# Patient Record
Sex: Female | Born: 1960 | Race: Black or African American | Hispanic: No | Marital: Single | Smoking: Former smoker
Health system: Southern US, Community
[De-identification: ages and names within clinical notes are randomized; demographics above are authoritative.]

## PROBLEM LIST (undated history)

## (undated) DIAGNOSIS — Z9109 Other allergy status, other than to drugs and biological substances: Secondary | ICD-10-CM

## (undated) DIAGNOSIS — Z9289 Personal history of other medical treatment: Secondary | ICD-10-CM

## (undated) DIAGNOSIS — T7840XA Allergy, unspecified, initial encounter: Secondary | ICD-10-CM

## (undated) DIAGNOSIS — R51 Headache: Secondary | ICD-10-CM

## (undated) DIAGNOSIS — I10 Essential (primary) hypertension: Secondary | ICD-10-CM

## (undated) HISTORY — DX: Allergy, unspecified, initial encounter: T78.40XA

## (undated) HISTORY — PX: INGUINAL HERNIA REPAIR: SUR1180

## (undated) HISTORY — PX: ABDOMINAL HYSTERECTOMY: SHX81

## (undated) HISTORY — DX: Essential (primary) hypertension: I10

## (undated) HISTORY — PX: TONSILLECTOMY: SUR1361

## (undated) HISTORY — PX: NECK SURGERY: SHX720

## (undated) HISTORY — PX: KNEE SURGERY: SHX244

## (undated) HISTORY — PX: TOTAL ABDOMINAL HYSTERECTOMY W/ BILATERAL SALPINGOOPHORECTOMY: SHX83

## (undated) HISTORY — PX: BLADDER REPAIR: SHX76

## (undated) HISTORY — PX: COLONOSCOPY: SHX174

---

## 2012-03-15 ENCOUNTER — Ambulatory Visit: Payer: Self-pay | Admitting: Family Medicine

## 2012-04-06 ENCOUNTER — Encounter: Payer: Self-pay | Admitting: Family Medicine

## 2012-04-06 ENCOUNTER — Ambulatory Visit (INDEPENDENT_AMBULATORY_CARE_PROVIDER_SITE_OTHER): Payer: BC Managed Care – PPO | Admitting: Family Medicine

## 2012-04-06 VITALS — BP 142/84 | HR 87 | Temp 97.1°F | Ht 66.0 in | Wt 190.4 lb

## 2012-04-06 DIAGNOSIS — I10 Essential (primary) hypertension: Secondary | ICD-10-CM

## 2012-04-06 DIAGNOSIS — N390 Urinary tract infection, site not specified: Secondary | ICD-10-CM

## 2012-04-06 DIAGNOSIS — M4120 Other idiopathic scoliosis, site unspecified: Secondary | ICD-10-CM

## 2012-04-06 DIAGNOSIS — J309 Allergic rhinitis, unspecified: Secondary | ICD-10-CM

## 2012-04-06 DIAGNOSIS — Z780 Asymptomatic menopausal state: Secondary | ICD-10-CM | POA: Insufficient documentation

## 2012-04-06 DIAGNOSIS — M419 Scoliosis, unspecified: Secondary | ICD-10-CM

## 2012-04-06 DIAGNOSIS — R0602 Shortness of breath: Secondary | ICD-10-CM

## 2012-04-06 DIAGNOSIS — G5620 Lesion of ulnar nerve, unspecified upper limb: Secondary | ICD-10-CM

## 2012-04-06 DIAGNOSIS — Z9109 Other allergy status, other than to drugs and biological substances: Secondary | ICD-10-CM | POA: Insufficient documentation

## 2012-04-06 LAB — POCT URINALYSIS DIPSTICK
Bilirubin, UA: NEGATIVE
Glucose, UA: NEGATIVE
Nitrite, UA: NEGATIVE
Spec Grav, UA: 1.02
Urobilinogen, UA: 0.2

## 2012-04-06 MED ORDER — AMLODIPINE BESYLATE 5 MG PO TABS: 5.0000 mg | ORAL_TABLET | Freq: Every day | ORAL | Status: DC

## 2012-04-06 MED ORDER — VALSARTAN-HYDROCHLOROTHIAZIDE 320-25 MG PO TABS: 1.0000 | ORAL_TABLET | Freq: Every day | ORAL | Status: DC

## 2012-04-06 MED ORDER — MELOXICAM 7.5 MG PO TABS: ORAL_TABLET | ORAL | Status: DC

## 2012-04-06 NOTE — Progress Notes (Signed)
  Subjective:    Helen Young is a 51 y.o. female who presents for evaluation of elevated blood pressures. . Cardiac symptoms: dyspnea, fatigue and lower extremity edema. Patient denies: chest pain, claudication, exertional chest pressure/discomfort, fatigue, irregular heart beat, near-syncope, orthopnea, palpitations, paroxysmal nocturnal dyspnea, syncope and tachypnea. Cardiovascular risk factors: hypertension and sedentary lifestyle. Use of agents associated with hypertension: none. History of target organ damage: none.  The following portions of the patient's history were reviewed and updated as appropriate: allergies, current medications, past family history, past medical history, past social history, past surgical history and problem list.  Review of Systems Pertinent items are noted in HPI.   Objective:    BP 142/84  Pulse 87  Temp 97.1 F (36.2 C) (Oral)  Ht 5\' 6"  (1.676 m)  Wt 190 lb 6.4 oz (86.365 kg)  BMI 30.73 kg/m2  SpO2 99% General appearance: alert, cooperative, appears stated age and no distress Lungs: clear to auscultation bilaterally Heart: S1, S2 normal Extremities: edema trace pitting edema L low ext  Cardiographics ECG: normal sinus rhythm    Assessment:    Hypertension, normal blood pressure . Evidence of target organ damage: none.    Plan:    Medication: begin diovan 320/25. Screening labs for initial evaluation: basic metabolic panel, blood sugar, lipid panel and potassium. Dietary sodium restriction. Regular aerobic exercise. Check blood pressures 2-3 times weekly and record. Follow up: 3 weeks and as needed.   Subjective:    Helen Young is a 51 y.o. female who presents for evaluation of elevated blood pressures. Age at onset of elevated blood pressure:  Several years. Cardiac symptoms: dyspnea and lower extremity edema. Patient denies: chest pain, chest pressure/discomfort, claudication, fatigue, irregular heart beat, near-syncope, orthopnea,  palpitations, paroxysmal nocturnal dyspnea, syncope and tachypnea. Cardiovascular risk factors: hypertension and sedentary lifestyle. Use of agents associated with hypertension: none. History of target organ damage: none.  The following portions of the patient's history were reviewed and updated as appropriate: allergies, current medications, past family history, past medical history, past social history, past surgical history and problem list.  Review of Systems Pertinent items are noted in HPI.   Objective:    BP 142/84  Pulse 87  Temp 97.1 F (36.2 C) (Oral)  Ht 5\' 6"  (1.676 m)  Wt 190 lb 6.4 oz (86.365 kg)  BMI 30.73 kg/m2  SpO2 99% General appearance: alert, cooperative, appears stated age and no distress Lungs: clear to auscultation bilaterally Heart: S1, S2 normal Extremities: edema trace edema  Cardiographics ECG: normal sinus rhythm    Assessment:    Hypertension, stage 1 . Evidence of target organ damage: none.    Plan:    Medication: increase to diovan 320/25. Screening labs for initial evaluation: basic metabolic panel, blood sugar, lipid panel, potassium and urinalysis. Dietary sodium restriction. Regular aerobic exercise. Check blood pressures 2-3 times weekly and record. Follow up: 3 weeks and as needed.

## 2012-04-06 NOTE — Patient Instructions (Addendum)

## 2012-04-07 LAB — HEPATIC FUNCTION PANEL
ALT: 17 U/L (ref 0–35)
AST: 20 U/L (ref 0–37)
Albumin: 4.3 g/dL (ref 3.5–5.2)
Alkaline Phosphatase: 51 U/L (ref 39–117)

## 2012-04-07 LAB — BASIC METABOLIC PANEL
CO2: 29 mEq/L (ref 19–32)
Calcium: 9.5 mg/dL (ref 8.4–10.5)
Chloride: 100 mEq/L (ref 96–112)
Glucose, Bld: 70 mg/dL (ref 70–99)
Sodium: 140 mEq/L (ref 135–145)

## 2012-04-07 LAB — CBC WITH DIFFERENTIAL/PLATELET
Basophils Absolute: 0 10*3/uL (ref 0.0–0.1)
Basophils Relative: 0.4 % (ref 0.0–3.0)
Eosinophils Relative: 3.7 % (ref 0.0–5.0)
HCT: 40.5 % (ref 36.0–46.0)
Hemoglobin: 13.3 g/dL (ref 12.0–15.0)
Lymphocytes Relative: 43.5 % (ref 12.0–46.0)
Lymphs Abs: 2.6 10*3/uL (ref 0.7–4.0)
Monocytes Relative: 7.2 % (ref 3.0–12.0)
Neutro Abs: 2.7 10*3/uL (ref 1.4–7.7)
RBC: 4.54 Mil/uL (ref 3.87–5.11)
RDW: 12.1 % (ref 11.5–14.6)
WBC: 6.1 10*3/uL (ref 4.5–10.5)

## 2012-04-07 LAB — LIPID PANEL: VLDL: 19.4 mg/dL (ref 0.0–40.0)

## 2012-04-07 LAB — TSH: TSH: 0.52 u[IU]/mL (ref 0.35–5.50)

## 2012-04-09 LAB — URINE CULTURE: Colony Count: 80000

## 2012-04-21 ENCOUNTER — Ambulatory Visit: Payer: Self-pay | Admitting: Family Medicine

## 2012-04-27 ENCOUNTER — Ambulatory Visit (INDEPENDENT_AMBULATORY_CARE_PROVIDER_SITE_OTHER): Payer: BC Managed Care – PPO | Admitting: Family Medicine

## 2012-04-27 ENCOUNTER — Encounter: Payer: Self-pay | Admitting: Family Medicine

## 2012-04-27 VITALS — BP 120/76 | HR 98 | Temp 98.4°F | Wt 187.4 lb

## 2012-04-27 DIAGNOSIS — B9789 Other viral agents as the cause of diseases classified elsewhere: Secondary | ICD-10-CM

## 2012-04-27 DIAGNOSIS — J329 Chronic sinusitis, unspecified: Secondary | ICD-10-CM

## 2012-04-27 MED ORDER — MOMETASONE FUROATE 50 MCG/ACT NA SUSP: 2.0000 | Freq: Every day | NASAL | Status: DC

## 2012-04-27 NOTE — Patient Instructions (Signed)

## 2012-04-27 NOTE — Progress Notes (Signed)
  Subjective:     Helen Young is a 51 y.o. female who presents for evaluation of sinus pain. Symptoms include: congestion, cough, purulent rhinorrhea and sore throat. Onset of symptoms was 1 week ago. Symptoms have been gradually worsening since that time. Past history is significant for no history of pneumonia or bronchitis. Patient is a non-smoker. Pt is also here for bp check.    The following portions of the patient's history were reviewed and updated as appropriate: allergies, current medications, past family history, past medical history, past social history, past surgical history and problem list.  Review of Systems Pertinent items are noted in HPI.   Objective:    BP 120/76  Pulse 98  Temp 98.4 F (36.9 C) (Oral)  Wt 187 lb 6.4 oz (85.004 kg)  SpO2 96% General appearance: alert, cooperative, appears stated age and no distress Ears: normal TM's and external ear canals both ears Nose: clear discharge, mild congestion, turbinates pink, swollen Throat: abnormal findings: pnd Neck: no adenopathy, supple, symmetrical, trachea midline and thyroid not enlarged, symmetric, no tenderness/mass/nodules Lungs: clear to auscultation bilaterally Extremities: extremities normal, atraumatic, no cyanosis or edema    Assessment:    Acute viral sinusitis.   HTN-- stable , con't meds Plan:    Nasal steroids per medication orders. Antihistamines per medication orders. f/u 3 months for bp check

## 2012-10-09 ENCOUNTER — Ambulatory Visit (INDEPENDENT_AMBULATORY_CARE_PROVIDER_SITE_OTHER): Payer: BC Managed Care – PPO | Admitting: Family Medicine

## 2012-10-09 ENCOUNTER — Encounter: Payer: Self-pay | Admitting: Family Medicine

## 2012-10-09 VITALS — BP 120/82 | HR 94 | Temp 98.6°F | Wt 192.0 lb

## 2012-10-09 DIAGNOSIS — M419 Scoliosis, unspecified: Secondary | ICD-10-CM

## 2012-10-09 DIAGNOSIS — M549 Dorsalgia, unspecified: Secondary | ICD-10-CM

## 2012-10-09 DIAGNOSIS — B9789 Other viral agents as the cause of diseases classified elsewhere: Secondary | ICD-10-CM

## 2012-10-09 DIAGNOSIS — M4120 Other idiopathic scoliosis, site unspecified: Secondary | ICD-10-CM

## 2012-10-09 DIAGNOSIS — I10 Essential (primary) hypertension: Secondary | ICD-10-CM

## 2012-10-09 DIAGNOSIS — M546 Pain in thoracic spine: Secondary | ICD-10-CM | POA: Insufficient documentation

## 2012-10-09 MED ORDER — AMLODIPINE BESYLATE 5 MG PO TABS: 5.0000 mg | ORAL_TABLET | Freq: Every day | ORAL | Status: DC

## 2012-10-09 MED ORDER — CYCLOBENZAPRINE HCL 10 MG PO TABS: 10.0000 mg | ORAL_TABLET | Freq: Three times a day (TID) | ORAL | Status: DC | PRN

## 2012-10-09 MED ORDER — MOMETASONE FUROATE 50 MCG/ACT NA SUSP: 2.0000 | Freq: Every day | NASAL | Status: DC

## 2012-10-09 NOTE — Assessment & Plan Note (Signed)
Check xray to check for progression

## 2012-10-09 NOTE — Patient Instructions (Signed)
Scoliosis Scoliosis is the name given to a spine that curves sideways. It is a common condition found in up to ten percent of adolescents. It is more common in teenage girls. This is sometimes the result of other underlying problems such as unequal leg length or muscular problems. Approximately 70% of the time the cause unknown. It can cause twisting of the shoulders, hips, chest, back, and rib cage. Exercises generally do not affect the course of this disease, but may be helpful in strengthening weak muscle groups. Orthopedic braces may be needed during growth spurts. Surgery may be necessary for progressive cases. HOME CARE INSTRUCTIONS   Your caregiver may suggest exercises to strengthen your muscles. Follow their instructions. Ask your caregiver if you can participate in sports activities.  Bracing may be needed to try to limit the progression of the spinal curve. Wear the brace as instructed by your caregiver.  Follow-up appointments are important. Often mild cases of scoliosis can be kept track of by regular physical exams. However, periodic x-rays may be taken in more severe cases to follow the progress of the curvature, especially with brace treatment. Scoliosis can be corrected or improved if treated early. SEEK IMMEDIATE MEDICAL CARE IF:  You have back pain that is not relieved by medications prescribed by your caregiver.  If there is weakness or increased muscle tone (spasticity) in your legs or any loss of bowel or bladder control. Document Released: 08/20/2000 Document Revised: 11/15/2011 Document Reviewed: 09/09/2008 [employer] Patient Information 2013 [employer].  

## 2012-10-09 NOTE — Assessment & Plan Note (Signed)
Check xray flexeril

## 2012-10-09 NOTE — Progress Notes (Signed)
  Subjective:    Patient ID: Helen Young, female    DOB: [birth year], 52 y.o.   MRN: [id number]  HPI Pt here c/o numbness in L arm and pain in upper back.  No known injury.   Review of Systems As above    Objective:   Physical Exam  BP 120/82  Pulse 94  Temp 98.6 F (37 C) (Oral)  Wt 192 lb (87.091 kg)  SpO2 97% Neck: no carotid bruit, no JVD, supple, symmetrical, trachea midline and thyroid not enlarged, symmetric, no tenderness/mass/nodules Extremities: edema no Neurologic: Motor: grossly normal Reflexes: 2+ and symmetric Gait: Normal  MS--  Musculoskeletal tenderness and "knot" in L trapezius  No dec rom    Assessment & Plan:

## 2012-10-10 ENCOUNTER — Other Ambulatory Visit: Payer: Self-pay | Admitting: Family Medicine

## 2012-10-10 ENCOUNTER — Ambulatory Visit
Admission: RE | Admit: 2012-10-10 | Discharge: 2012-10-10 | Disposition: A | Discharge status: Home / Self Care | Payer: BC Managed Care – PPO | Source: Ambulatory Visit | Attending: Family Medicine | Admitting: Family Medicine

## 2012-10-10 ENCOUNTER — Other Ambulatory Visit: Payer: Self-pay

## 2012-10-10 ENCOUNTER — Encounter: Payer: Self-pay | Admitting: Family Medicine

## 2012-10-10 DIAGNOSIS — M549 Dorsalgia, unspecified: Secondary | ICD-10-CM

## 2012-10-10 DIAGNOSIS — M546 Pain in thoracic spine: Secondary | ICD-10-CM

## 2012-10-10 DIAGNOSIS — M419 Scoliosis, unspecified: Secondary | ICD-10-CM

## 2012-10-11 ENCOUNTER — Encounter: Payer: Self-pay | Admitting: Family Medicine

## 2012-10-13 ENCOUNTER — Encounter: Payer: Self-pay | Admitting: Family Medicine

## 2012-10-13 DIAGNOSIS — M549 Dorsalgia, unspecified: Secondary | ICD-10-CM

## 2012-10-19 ENCOUNTER — Other Ambulatory Visit: Payer: Self-pay | Admitting: Family Medicine

## 2012-10-21 ENCOUNTER — Other Ambulatory Visit: Payer: Self-pay

## 2012-10-21 NOTE — Telephone Encounter (Signed)
Rx sent to the pharmacy([facility] [address]) by e-script.//AB/CMA

## 2012-10-31 ENCOUNTER — Encounter: Payer: Self-pay | Admitting: Family Medicine

## 2012-10-31 ENCOUNTER — Ambulatory Visit (INDEPENDENT_AMBULATORY_CARE_PROVIDER_SITE_OTHER): Payer: BC Managed Care – PPO | Admitting: Family Medicine

## 2012-10-31 VITALS — BP 122/88 | HR 99 | Temp 98.2°F | Wt 194.8 lb

## 2012-10-31 DIAGNOSIS — R05 Cough: Secondary | ICD-10-CM

## 2012-10-31 DIAGNOSIS — J322 Chronic ethmoidal sinusitis: Secondary | ICD-10-CM

## 2012-10-31 DIAGNOSIS — J329 Chronic sinusitis, unspecified: Secondary | ICD-10-CM

## 2012-10-31 DIAGNOSIS — B9789 Other viral agents as the cause of diseases classified elsewhere: Secondary | ICD-10-CM

## 2012-10-31 DIAGNOSIS — J019 Acute sinusitis, unspecified: Secondary | ICD-10-CM

## 2012-10-31 MED ORDER — MOMETASONE FUROATE 50 MCG/ACT NA SUSP: 2.0000 | Freq: Every day | NASAL | Status: DC

## 2012-10-31 MED ORDER — CEFUROXIME AXETIL 500 MG PO TABS: 500.0000 mg | ORAL_TABLET | Freq: Two times a day (BID) | ORAL | Status: AC

## 2012-10-31 MED ORDER — GUAIFENESIN-CODEINE 100-10 MG/5ML PO SYRP: ORAL_SOLUTION | ORAL | Status: DC

## 2012-10-31 NOTE — Patient Instructions (Signed)

## 2012-10-31 NOTE — Progress Notes (Signed)
  Subjective:     Helen Young is a 52 y.o. female who presents for evaluation of symptoms of a URI, possible sinusitis. Symptoms include congestion, facial pain, nasal congestion, post nasal drip and sinus pressure. Onset of symptoms was 1 week ago, and has been gradually worsening since that time. Treatment to date: antihistamines, cough suppressants and decongestants.  The following portions of the patient's history were reviewed and updated as appropriate: allergies, current medications, past family history, past medical history, past social history, past surgical history and problem list.  Review of Systems Pertinent items are noted in HPI.   Objective:    BP 122/88  Pulse 99  Temp(Src) 98.2 F (36.8 C) (Oral)  Wt 194 lb 12.8 oz (88.361 kg)  BMI 31.46 kg/m2  SpO2 99% General appearance: alert, cooperative, appears stated age and no distress Ears: normal TM's and external ear canals both ears Nose: green discharge, moderate congestion, turbinates red, swollen, sinus tenderness bilateral Throat: lips, mucosa, and tongue normal; teeth and gums normal Neck: mild anterior cervical adenopathy, supple, symmetrical, trachea midline and thyroid not enlarged, symmetric, no tenderness/mass/nodules Lungs: clear to auscultation bilaterally Heart: S1, S2 normal   Assessment:    sinusitis and viral upper respiratory illness   Plan:    Discussed the diagnosis and treatment of sinusitis. Suggested symptomatic OTC remedies. Nasal saline spray for congestion. Ceftin per orders. Nasal steroids per orders. Follow up as needed. -

## 2012-11-09 ENCOUNTER — Encounter: Payer: Self-pay | Admitting: Family Medicine

## 2012-11-09 DIAGNOSIS — M4120 Other idiopathic scoliosis, site unspecified: Secondary | ICD-10-CM

## 2012-11-09 NOTE — Telephone Encounter (Signed)
To MD for review     KP 

## 2012-11-13 ENCOUNTER — Encounter: Payer: Self-pay | Admitting: Family Medicine

## 2012-11-13 NOTE — Addendum Note (Signed)
Addended by: Arnette Norris on: 11/13/2012 09:23 AM   Modules accepted: Orders

## 2012-11-13 NOTE — Telephone Encounter (Signed)
Ok to fax report to plastic surgeon  Refer to ortho for scoliosis  Ok to give note that she is having back and neck pain and may benefit from a breast reduction.

## 2012-11-16 ENCOUNTER — Encounter: Payer: Self-pay | Admitting: Family Medicine

## 2012-11-16 ENCOUNTER — Ambulatory Visit
Admission: RE | Admit: 2012-11-16 | Discharge: 2012-11-16 | Disposition: A | Discharge status: Home / Self Care | Payer: BC Managed Care – PPO | Source: Ambulatory Visit | Attending: Family Medicine | Admitting: Family Medicine

## 2012-11-16 ENCOUNTER — Ambulatory Visit (INDEPENDENT_AMBULATORY_CARE_PROVIDER_SITE_OTHER): Payer: BC Managed Care – PPO | Admitting: Family Medicine

## 2012-11-16 VITALS — BP 126/82 | HR 95 | Temp 98.9°F | Wt 197.4 lb

## 2012-11-16 DIAGNOSIS — M25562 Pain in left knee: Secondary | ICD-10-CM

## 2012-11-16 DIAGNOSIS — M25569 Pain in unspecified knee: Secondary | ICD-10-CM

## 2012-11-16 NOTE — Progress Notes (Signed)
  Subjective:    Helen Young is a 52 y.o. female who presents with knee pain involving the left knee. Onset was sudden, not related to any specific activity. Inciting event: none known. Current symptoms include: giving out, pain located ant knee, stiffness and swelling. Pain is aggravated by going up and down stairs. Patient has had no prior knee problems. Evaluation to date: none. Treatment to date: none.  The following portions of the patient's history were reviewed and updated as appropriate: allergies, current medications, past family history, past medical history, past social history, past surgical history and problem list.   Review of Systems Pertinent items are noted in HPI.   Objective:    BP 126/82  Pulse 95  Temp(Src) 98.9 F (37.2 C) (Oral)  Wt 197 lb 6.4 oz (89.54 kg)  BMI 31.88 kg/m2  SpO2 97% Right knee: normal and no effusion, full active range of motion, no joint line tenderness, ligamentous structures intact.  Left knee:  positive exam findings: crepitus and tenderness noted anterior   X-ray left knee: not available    Assessment:    Left pain    Plan:    Natural history and expected course discussed. Questions answered. Transport planner distributed. Rest, ice, compression, and elevation (RICE) therapy. Reduction in offending activity. Patellar compression sleeve. ---- refer to ortho if no relief

## 2012-11-16 NOTE — Patient Instructions (Signed)
Knee Pain The knee is the complex joint between your thigh and your lower leg. It is made up of bones, tendons, ligaments, and cartilage. The bones that make up the knee are:  The femur in the thigh.  The tibia and fibula in the lower leg.  The patella or kneecap riding in the groove on the lower femur. CAUSES  Knee pain is a common complaint with many causes. A few of these causes are:  Injury, such as:  A ruptured ligament or tendon injury.  Torn cartilage.  Medical conditions, such as:  Gout  Arthritis  Infections  Overuse, over training or overdoing a physical activity. Knee pain can be minor or severe. Knee pain can accompany debilitating injury. Minor knee problems often respond well to self-care measures or get well on their own. More serious injuries may need medical intervention or even surgery. SYMPTOMS The knee is complex. Symptoms of knee problems can vary widely. Some of the problems are:  Pain with movement and weight bearing.  Swelling and tenderness.  Buckling of the knee.  Inability to straighten or extend your knee.  Your knee locks and you cannot straighten it.  Warmth and redness with pain and fever.  Deformity or dislocation of the kneecap. DIAGNOSIS  Determining what is wrong may be very straight forward such as when there is an injury. It can also be challenging because of the complexity of the knee. Tests to make a diagnosis may include:  Your caregiver taking a history and doing a physical exam.  Routine X-rays can be used to rule out other problems. X-rays will not reveal a cartilage tear. Some injuries of the knee can be diagnosed by:  Arthroscopy a surgical technique by which a small video camera is inserted through tiny incisions on the sides of the knee. This procedure is used to examine and repair internal knee joint problems. Tiny instruments can be used during arthroscopy to repair the torn knee cartilage (meniscus).  Arthrography  is a radiology technique. A contrast liquid is directly injected into the knee joint. Internal structures of the knee joint then become visible on X-ray film.  An MRI scan is a non x-ray radiology procedure in which magnetic fields and a computer produce two- or three-dimensional images of the inside of the knee. Cartilage tears are often visible using an MRI scanner. MRI scans have largely replaced arthrography in diagnosing cartilage tears of the knee.  Blood work.  Examination of the fluid that helps to lubricate the knee joint (synovial fluid). This is done by taking a sample out using a needle and a syringe. TREATMENT The treatment of knee problems depends on the cause. Some of these treatments are:  Depending on the injury, proper casting, splinting, surgery or physical therapy care will be needed.  Give yourself adequate recovery time. Do not overuse your joints. If you begin to get sore during workout routines, back off. Slow down or do fewer repetitions.  For repetitive activities such as cycling or running, maintain your strength and nutrition.  Alternate muscle groups. For example if you are a weight lifter, work the upper body on one day and the lower body the next.  Either tight or weak muscles do not give the proper support for your knee. Tight or weak muscles do not absorb the stress placed on the knee joint. Keep the muscles surrounding the knee strong.  Take care of mechanical problems.  If you have flat feet, orthotics or special shoes may help.   See your caregiver if you need help.  Arch supports, sometimes with wedges on the inner or outer aspect of the heel, can help. These can shift pressure away from the side of the knee most bothered by osteoarthritis.  A brace called an "unloader" brace also may be used to help ease the pressure on the most arthritic side of the knee.  If your caregiver has prescribed crutches, braces, wraps or ice, use as directed. The acronym for  this is PRICE. This means protection, rest, ice, compression and elevation.  Nonsteroidal anti-inflammatory drugs (NSAID's), can help relieve pain. But if taken immediately after an injury, they may actually increase swelling. Take NSAID's with food in your stomach. Stop them if you develop stomach problems. Do not take these if you have a history of ulcers, stomach pain or bleeding from the bowel. Do not take without your caregiver's approval if you have problems with fluid retention, heart failure, or kidney problems.  For ongoing knee problems, physical therapy may be helpful.  Glucosamine and chondroitin are over-the-counter dietary supplements. Both may help relieve the pain of osteoarthritis in the knee. These medicines are different from the usual anti-inflammatory drugs. Glucosamine may decrease the rate of cartilage destruction.  Injections of a corticosteroid drug into your knee joint may help reduce the symptoms of an arthritis flare-up. They may provide pain relief that lasts a few months. You may have to wait a few months between injections. The injections do have a small increased risk of infection, water retention and elevated blood sugar levels.  Hyaluronic acid injected into damaged joints may ease pain and provide lubrication. These injections may work by reducing inflammation. A series of shots may give relief for as long as 6 months.  Topical painkillers. Applying certain ointments to your skin may help relieve the pain and stiffness of osteoarthritis. Ask your pharmacist for suggestions. Many over the-counter products are approved for temporary relief of arthritis pain.  In some countries, doctors often prescribe topical NSAID's for relief of chronic conditions such as arthritis and tendinitis. A review of treatment with NSAID creams found that they worked as well as oral medications but without the serious side effects. PREVENTION  Maintain a healthy weight. Extra pounds put  more strain on your joints.  Get strong, stay limber. Weak muscles are a common cause of knee injuries. Stretching is important. Include flexibility exercises in your workouts.  Be smart about exercise. If you have osteoarthritis, chronic knee pain or recurring injuries, you may need to change the way you exercise. This does not mean you have to stop being active. If your knees ache after jogging or playing basketball, consider switching to swimming, water aerobics or other low-impact activities, at least for a few days a week. Sometimes limiting high-impact activities will provide relief.  Make sure your shoes fit well. Choose footwear that is right for your sport.  Protect your knees. Use the proper gear for knee-sensitive activities. Use kneepads when playing volleyball or laying carpet. Buckle your seat belt every time you drive. Most shattered kneecaps occur in car accidents.  Rest when you are tired. SEEK MEDICAL CARE IF:  You have knee pain that is continual and does not seem to be getting better.  SEEK IMMEDIATE MEDICAL CARE IF:  Your knee joint feels hot to the touch and you have a high fever. MAKE SURE YOU:   Understand these instructions.  Will watch your condition.  Will get help right away if you are not   doing well or get worse. Document Released: 06/20/2007 Document Revised: 11/15/2011 Document Reviewed: 06/20/2007 [employer] Patient Information 2013 [employer].  

## 2013-02-05 ENCOUNTER — Ambulatory Visit (INDEPENDENT_AMBULATORY_CARE_PROVIDER_SITE_OTHER): Payer: BC Managed Care – PPO | Admitting: Family Medicine

## 2013-02-05 ENCOUNTER — Encounter: Payer: Self-pay | Admitting: Family Medicine

## 2013-02-05 VITALS — BP 120/86 | HR 82 | Temp 98.8°F | Wt 196.0 lb

## 2013-02-05 DIAGNOSIS — K432 Incisional hernia without obstruction or gangrene: Secondary | ICD-10-CM

## 2013-02-05 NOTE — Patient Instructions (Signed)
Hernia A hernia occurs when an internal organ pushes out through a weak spot in the abdominal wall. Hernias most commonly occur in the groin and around the navel. Hernias often can be pushed back into place (reduced). Most hernias tend to get worse over time. Some abdominal hernias can get stuck in the opening (irreducible or incarcerated hernia) and cannot be reduced. An irreducible abdominal hernia which is tightly squeezed into the opening is at risk for impaired blood supply (strangulated hernia). A strangulated hernia is a medical emergency. Because of the risk for an irreducible or strangulated hernia, surgery may be recommended to repair a hernia. CAUSES   Heavy lifting.  Prolonged coughing.  Straining to have a bowel movement.  A cut (incision) made during an abdominal surgery. HOME CARE INSTRUCTIONS   Bed rest is not required. You may continue your normal activities.  Avoid lifting more than 10 pounds (4.5 kg) or straining.  Cough gently. If you are a smoker it is best to stop. Even the best hernia repair can break down with the continual strain of coughing. Even if you do not have your hernia repaired, a cough will continue to aggravate the problem.  Do not wear anything tight over your hernia. Do not try to keep it in with an outside bandage or truss. These can damage abdominal contents if they are trapped within the hernia sac.  Eat a normal diet.  Avoid constipation. Straining over long periods of time will increase hernia size and encourage breakdown of repairs. If you cannot do this with diet alone, stool softeners may be used. SEEK IMMEDIATE MEDICAL CARE IF:   You have a fever.  You develop increasing abdominal pain.  You feel nauseous or vomit.  Your hernia is stuck outside the abdomen, looks discolored, feels hard, or is tender.  You have any changes in your bowel habits or in the hernia that are unusual for you.  You have increased pain or swelling around the  hernia.  You cannot push the hernia back in place by applying gentle pressure while lying down. MAKE SURE YOU:   Understand these instructions.  Will watch your condition.  Will get help right away if you are not doing well or get worse. Document Released: 08/23/2005 Document Revised: 11/15/2011 Document Reviewed: 04/11/2008 [employer] Patient Information 2014 [employer].  

## 2013-02-05 NOTE — Progress Notes (Signed)
  Subjective:     Helen Young is a 52 y.o. female who presents for evaluation of abdominal pain. Onset was several weeks ago. Symptoms have been gradually worsening. The pain is described as sharp. Pain is located in the suprapubic region without radiation.  Aggravating factors: activity.  Alleviating factors: recumbency. Associated symptoms: none. The patient denies anorexia, arthralagias, belching, chills, constipation, diarrhea, dysuria, fever, flatus, frequency, headache, hematochezia, hematuria, melena, myalgias, nausea, sweats and vomiting.  Pt has had hernias in past and is concerned that is what this is in the scar from her hysterectomy.  The patient's history has been marked as reviewed and updated as appropriate.  Review of Systems Pertinent items are noted in HPI.     Objective:    BP 120/86  Pulse 82  Temp(Src) 98.8 F (37.1 C) (Oral)  Wt 196 lb (88.905 kg)  BMI 31.65 kg/m2  SpO2 98% General appearance: alert, cooperative, appears stated age and no distress Abdomen: abnormal findings:  moderate tenderness over TAH scar on Left and mass palpated that is reducible    Assessment:    Abdominal pain, likely secondary to hernia .    Plan:    refer to surgery  Pt instructed to go to Er if hernia comes out and does not reduce or if inc pain

## 2013-02-09 ENCOUNTER — Encounter (INDEPENDENT_AMBULATORY_CARE_PROVIDER_SITE_OTHER): Payer: Self-pay | Admitting: Surgery

## 2013-02-09 ENCOUNTER — Ambulatory Visit (INDEPENDENT_AMBULATORY_CARE_PROVIDER_SITE_OTHER): Payer: BC Managed Care – PPO | Admitting: Surgery

## 2013-02-09 VITALS — BP 138/88 | HR 80 | Temp 97.4°F | Resp 16 | Ht 65.0 in | Wt 195.2 lb

## 2013-02-09 DIAGNOSIS — K432 Incisional hernia without obstruction or gangrene: Secondary | ICD-10-CM

## 2013-02-09 NOTE — Progress Notes (Signed)
Patient ID: Helen Young, female   DOB: 1960-10-16, 52 y.o.   MRN: [id number]  Chief Complaint  Patient presents with  . New Evaluation    eval incisional hernia    HPI Helen Young is a 52 y.o. female.   HPI This is a very pleasant female referred to me by Dr. Laury Axon for evaluation of a recurrent incisional hernia. She has had a previous hysterectomy. She then developed a hernia in her left lower quadrant which was repaired. This recurred and was repaired again approximately 8 years ago. This was done in [place]. She believes mesh was used. She is having increasing discomfort in the left groin has noticed a bulge. She has no obstructive symptoms. The pain is moderate in intensity and intermittent. It is not referred anywhere else. Past Medical History  Diagnosis Date  . HTN (hypertension)     Past Surgical History  Procedure Laterality Date  . Hernia repair      x's 2   . Knee surgery      Left  . Total abdominal hysterectomy w/ bilateral salpingoophorectomy    . Neck surgery      Disc replaced with a steel plate    Family History  Problem Relation Age of Onset  . Colon cancer Father   . Prostate cancer Father   . Heart disease Mother   . Heart disease Father   . Stroke Mother   . Hypertension Mother   . Hypertension Father   . Kidney disease Maternal Grandfather   . Diabetes Maternal Grandfather   . Sudden death Paternal Grandfather   . Sudden death Paternal Uncle     Cardiac issues    Social History History  Substance Use Topics  . Smoking status: Former Smoker    Quit date: 09/06/2005  . Smokeless tobacco: Never Used  . Alcohol Use: Yes     Comment: Rarely    Allergies  Allergen Reactions  . Percocet (Oxycodone-Acetaminophen) Swelling    Current Outpatient Prescriptions  Medication Sig Dispense Refill  . amLODipine (NORVASC) 5 MG tablet Take 1 tablet (5 mg total) by mouth daily.  30 tablet  5  . cetirizine (ZYRTEC) 10 MG tablet Take 10 mg by mouth daily.       . cyclobenzaprine (FLEXERIL) 10 MG tablet Take 1 tablet (10 mg total) by mouth 3 (three) times daily as needed for muscle spasms.  30 tablet  0  . estrogens, conjugated, (PREMARIN) 0.625 MG tablet Take 0.625 mg by mouth daily. Take daily for 21 days then do not take for 7 days.      . meloxicam (MOBIC) 7.5 MG tablet 1-2 po qd prn pain  60 tablet  2  . mometasone (NASONEX) 50 MCG/ACT nasal spray Place 2 sprays into the nose daily.  17 g  12  . valsartan-hydrochlorothiazide (DIOVAN-HCT) 320-25 MG per tablet TAKE 1 TABLET BY MOUTH DAILY  30 tablet  5   No current facility-administered medications for this visit.    Review of Systems Review of Systems  Constitutional: Negative for fever, chills and unexpected weight change.  HENT: Negative for hearing loss, congestion, sore throat, trouble swallowing and voice change.   Eyes: Negative for visual disturbance.  Respiratory: Negative for cough and wheezing.   Cardiovascular: Negative for chest pain, palpitations and leg swelling.  Gastrointestinal: Negative for nausea, vomiting, abdominal pain, diarrhea, constipation, blood in stool, abdominal distention and anal bleeding.  Genitourinary: Negative for hematuria, vaginal bleeding and difficulty urinating.  Musculoskeletal: Negative  for arthralgias.  Skin: Negative for rash and wound.  Neurological: Negative for seizures, syncope and headaches.  Hematological: Negative for adenopathy. Does not bruise/bleed easily.  Psychiatric/Behavioral: Negative for confusion.    Blood pressure 138/88, pulse 80, temperature 97.4 F (36.3 C), temperature source Temporal, resp. rate 16, height 5\' 5"  (1.651 m), weight 195 lb 3.2 oz (88.542 kg).  Physical Exam Physical Exam  Constitutional: She is oriented to person, place, and time. She appears well-developed and well-nourished. No distress.  HENT:  Head: Normocephalic and atraumatic.  Right Ear: External ear normal.  Left Ear: External ear normal.  Nose:  Nose normal.  Mouth/Throat: Oropharynx is clear and moist. No oropharyngeal exudate.  Eyes: Conjunctivae are normal. Pupils are equal, round, and reactive to light. Right eye exhibits no discharge. Left eye exhibits no discharge. No scleral icterus.  Neck: Normal range of motion. Neck supple. No tracheal deviation present. No thyromegaly present.  Cardiovascular: Normal rate, regular rhythm, normal heart sounds and intact distal pulses.   No murmur heard. Pulmonary/Chest: Effort normal and breath sounds normal. No respiratory distress. She has no wheezes.  Abdominal: Soft. Bowel sounds are normal. She exhibits no distension. There is no tenderness.  Her abdomen is soft. There is a well-healed Pfannenstiel incision. There is a difficult to reduce hernia at the left side of the incision  Musculoskeletal: Normal range of motion. She exhibits no edema and no tenderness.  Lymphadenopathy:    She has no cervical adenopathy.  Neurological: She is alert and oriented to person, place, and time. No cranial nerve deficit. Coordination normal.  Skin: Skin is warm and dry. No rash noted. She is not diaphoretic. No erythema.  Psychiatric: Her behavior is normal. Judgment normal.    Data Reviewed   Assessment    Recurrent incisional hernia     Plan    Laparoscopic repair with mesh was recommended. I discussed this with her in detail. I discussed the risk of surgery which includes but is not limited to bleeding, infection, injury to surrounding structures, nerve entrapment, chronic pain, recurrence, etc. She understands and wishes to proceed. Surgery will be scheduled        Elanie Hammitt A 02/09/2013, 11:30 AM

## 2013-02-21 ENCOUNTER — Encounter (HOSPITAL_COMMUNITY): Payer: Self-pay | Admitting: Pharmacy Technician

## 2013-02-26 ENCOUNTER — Other Ambulatory Visit (HOSPITAL_COMMUNITY): Payer: BC Managed Care – PPO

## 2013-02-27 ENCOUNTER — Encounter (HOSPITAL_COMMUNITY)
Admission: RE | Admit: 2013-02-27 | Discharge: 2013-02-27 | Disposition: A | Discharge status: Home / Self Care | Payer: BC Managed Care – PPO | Source: Ambulatory Visit | Attending: Surgery | Admitting: Surgery

## 2013-02-27 ENCOUNTER — Encounter (HOSPITAL_COMMUNITY): Payer: Self-pay

## 2013-02-27 ENCOUNTER — Ambulatory Visit (HOSPITAL_COMMUNITY)
Admission: RE | Admit: 2013-02-27 | Discharge: 2013-02-27 | Disposition: A | Discharge status: Home / Self Care | Payer: BC Managed Care – PPO | Source: Ambulatory Visit | Attending: Surgery | Admitting: Surgery

## 2013-02-27 DIAGNOSIS — Z01818 Encounter for other preprocedural examination: Secondary | ICD-10-CM | POA: Insufficient documentation

## 2013-02-27 DIAGNOSIS — I10 Essential (primary) hypertension: Secondary | ICD-10-CM | POA: Insufficient documentation

## 2013-02-27 DIAGNOSIS — M4120 Other idiopathic scoliosis, site unspecified: Secondary | ICD-10-CM | POA: Insufficient documentation

## 2013-02-27 DIAGNOSIS — R51 Headache: Secondary | ICD-10-CM

## 2013-02-27 DIAGNOSIS — K432 Incisional hernia without obstruction or gangrene: Secondary | ICD-10-CM | POA: Insufficient documentation

## 2013-02-27 DIAGNOSIS — Z9109 Other allergy status, other than to drugs and biological substances: Secondary | ICD-10-CM

## 2013-02-27 HISTORY — DX: Personal history of other medical treatment: Z92.89

## 2013-02-27 HISTORY — DX: Other allergy status, other than to drugs and biological substances: Z91.09

## 2013-02-27 HISTORY — DX: Headache: R51

## 2013-02-27 LAB — BASIC METABOLIC PANEL
BUN: 9 mg/dL (ref 6–23)
Calcium: 9.6 mg/dL (ref 8.4–10.5)
Chloride: 104 mEq/L (ref 96–112)
Creatinine, Ser: 0.69 mg/dL (ref 0.50–1.10)
GFR calc Af Amer: >90 mL/min (ref >90)
GFR calc non Af Amer: >90 mL/min (ref >90)

## 2013-02-27 LAB — CBC
HCT: 42.1 % (ref 36.0–46.0)
MCH: 28.4 pg (ref 26.0–34.0)
MCHC: 32.1 g/dL (ref 30.0–36.0)
MCV: 88.4 fL (ref 78.0–100.0)
Platelets: 369 10*3/uL (ref 150–400)
RDW: 12.2 % (ref 11.5–15.5)

## 2013-02-27 LAB — SURGICAL PCR SCREEN: MRSA, PCR: NEGATIVE

## 2013-02-27 NOTE — Pre-Procedure Instructions (Addendum)
02-27-13 EKG 8'13-Epic,CXR done today. 02-27-13 Pt. Notified of Positive Staph aureus PCR- will use Mupirocin as directed.

## 2013-02-27 NOTE — Patient Instructions (Addendum)
20 Helen Young  02/27/2013   Your procedure is scheduled on:  6-27 -2014  Report to [facility] at     0630   AM.  Call this number if you have problems the morning of surgery: [phone]  Or Presurgical Testing [phone](Liesl Simons)    Do not eat food:After Midnight.    Take these medicines the morning of surgery with A SIP OF WATER: Amlodipine. Premarin. Nasonex spray.  Do not wear jewelry, make-up or nail polish.  Do not wear lotions, powders, or perfumes. You may wear deodorant.  Do not shave 12 hours prior to first CHG shower(legs and under arms).(face and neck okay.)  Do not bring valuables to the hospital.  Contacts, dentures or bridgework,body piercing,  may not be worn into surgery.  Leave suitcase in the car. After surgery it may be brought to your room.  For patients admitted to the hospital, checkout time is 11:00 AM the day of discharge.   Patients discharged the day of surgery will not be allowed to drive home. Must have responsible person with you x 24 hours once discharged.  Name and phone number of your driver: Louanna Raw, friend, [phone] cell  Special Instructions: CHG(Chlorhedine 4%-"Hibiclens","Betasept","Aplicare") Shower Use Special Wash: see special instructions.(avoid face and genitals)   Please read over the following fact sheets that you were given: MRSA Information.    Failure to follow these instructions may result in Cancellation of your surgery.   Patient signature_______________________________________________________

## 2013-03-01 NOTE — H&P (Signed)
Patient ID: Helen Young, female   DOB: 05/18/1961, 52 y.o.   MRN: [id number]    Chief Complaint   Patient presents with   .  New Evaluation       eval incisional hernia        HPI Helen Young is a 52 y.o. female.   HPI This is a very pleasant female referred to me by Dr. Laury Axon for evaluation of a recurrent incisional hernia. She has had a previous hysterectomy. She then developed a hernia in her left lower quadrant which was repaired. This recurred and was repaired again approximately 8 years ago. This was done in [place]. She believes mesh was used. She is having increasing discomfort in the left groin has noticed a bulge. She has no obstructive symptoms. The pain is moderate in intensity and intermittent. It is not referred anywhere else. Past Medical History   Diagnosis  Date   .  HTN (hypertension)           Past Surgical History   Procedure  Laterality  Date   .  Hernia repair           x's 2    .  Knee surgery           Left   .  Total abdominal hysterectomy w/ bilateral salpingoophorectomy       .  Neck surgery           Disc replaced with a steel plate         Family History   Problem  Relation  Age of Onset   .  Colon cancer  Father     .  Prostate cancer  Father     .  Heart disease  Mother     .  Heart disease  Father     .  Stroke  Mother     .  Hypertension  Mother     .  Hypertension  Father     .  Kidney disease  Maternal Grandfather     .  Diabetes  Maternal Grandfather     .  Sudden death  Paternal Grandfather     .  Sudden death  Paternal Uncle         Cardiac issues        Social History History   Substance Use Topics   .  Smoking status:  Former Smoker       Quit date:  09/06/2005   .  Smokeless tobacco:  Never Used   .  Alcohol Use:  Yes         Comment: Rarely         Allergies   Allergen  Reactions   .  Percocet (Oxycodone-Acetaminophen)  Swelling         Current Outpatient Prescriptions   Medication  Sig  Dispense   Refill   .  amLODipine (NORVASC) 5 MG tablet  Take 1 tablet (5 mg total) by mouth daily.   30 tablet   5   .  cetirizine (ZYRTEC) 10 MG tablet  Take 10 mg by mouth daily.         .  cyclobenzaprine (FLEXERIL) 10 MG tablet  Take 1 tablet (10 mg total) by mouth 3 (three) times daily as needed for muscle spasms.   30 tablet   0   .  estrogens, conjugated, (PREMARIN) 0.625 MG tablet  Take 0.625 mg by mouth daily. Take daily for 21  days then do not take for 7 days.         .  meloxicam (MOBIC) 7.5 MG tablet  1-2 po qd prn pain   60 tablet   2   .  mometasone (NASONEX) 50 MCG/ACT nasal spray  Place 2 sprays into the nose daily.   17 g   12   .  valsartan-hydrochlorothiazide (DIOVAN-HCT) 320-25 MG per tablet  TAKE 1 TABLET BY MOUTH DAILY   30 tablet   5       No current facility-administered medications for this visit.        Review of Systems Review of Systems  Constitutional: Negative for fever, chills and unexpected weight change.  HENT: Negative for hearing loss, congestion, sore throat, trouble swallowing and voice change.   Eyes: Negative for visual disturbance.  Respiratory: Negative for cough and wheezing.   Cardiovascular: Negative for chest pain, palpitations and leg swelling.  Gastrointestinal: Negative for nausea, vomiting, abdominal pain, diarrhea, constipation, blood in stool, abdominal distention and anal bleeding.  Genitourinary: Negative for hematuria, vaginal bleeding and difficulty urinating.  Musculoskeletal: Negative for arthralgias.  Skin: Negative for rash and wound.  Neurological: Negative for seizures, syncope and headaches.  Hematological: Negative for adenopathy. Does not bruise/bleed easily.  Psychiatric/Behavioral: Negative for confusion.      Blood pressure 138/88, pulse 80, temperature 97.4 F (36.3 C), temperature source Temporal, resp. rate 16, height 5\' 5"  (1.651 m), weight 195 lb 3.2 oz (88.542 kg).   Physical Exam Physical Exam  Constitutional: She  is oriented to person, place, and time. She appears well-developed and well-nourished. No distress.  HENT:   Head: Normocephalic and atraumatic.  Right Ear: External ear normal.  Left Ear: External ear normal.   Nose: Nose normal.   Mouth/Throat: Oropharynx is clear and moist. No oropharyngeal exudate.  Eyes: Conjunctivae are normal. Pupils are equal, round, and reactive to light. Right eye exhibits no discharge. Left eye exhibits no discharge. No scleral icterus.  Neck: Normal range of motion. Neck supple. No tracheal deviation present. No thyromegaly present.  Cardiovascular: Normal rate, regular rhythm, normal heart sounds and intact distal pulses.    No murmur heard. Pulmonary/Chest: Effort normal and breath sounds normal. No respiratory distress. She has no wheezes.  Abdominal: Soft. Bowel sounds are normal. She exhibits no distension. There is no tenderness.  Her abdomen is soft. There is a well-healed Pfannenstiel incision. There is a difficult to reduce hernia at the left side of the incision  Musculoskeletal: Normal range of motion. She exhibits no edema and no tenderness.  Lymphadenopathy:    She has no cervical adenopathy.  Neurological: She is alert and oriented to person, place, and time. No cranial nerve deficit. Coordination normal.  Skin: Skin is warm and dry. No rash noted. She is not diaphoretic. No erythema.  Psychiatric: Her behavior is normal. Judgment normal.      Data Reviewed     Assessment    Recurrent incisional hernia      Plan    Laparoscopic repair with mesh was recommended. I discussed this with her in detail. I discussed the risk of surgery which includes but is not limited to bleeding, infection, injury to surrounding structures, nerve entrapment, chronic pain, recurrence, etc. She understands and wishes to proceed. Surgery will be scheduled

## 2013-03-02 ENCOUNTER — Encounter (HOSPITAL_COMMUNITY): Payer: Self-pay | Admitting: Anesthesiology

## 2013-03-02 ENCOUNTER — Encounter (HOSPITAL_COMMUNITY)
Admission: RE | Disposition: A | Discharge status: Home / Self Care | Payer: Self-pay | Source: Ambulatory Visit | Attending: Surgery

## 2013-03-02 ENCOUNTER — Ambulatory Visit (HOSPITAL_COMMUNITY): Payer: BC Managed Care – PPO | Admitting: Anesthesiology

## 2013-03-02 ENCOUNTER — Inpatient Hospital Stay (HOSPITAL_COMMUNITY)
Admission: RE | Admit: 2013-03-02 | Discharge: 2013-03-04 | DRG: 160 | Disposition: A | Discharge status: Home / Self Care | Payer: BC Managed Care – PPO | Source: Ambulatory Visit | Attending: Surgery | Admitting: Surgery

## 2013-03-02 ENCOUNTER — Encounter (HOSPITAL_COMMUNITY): Payer: Self-pay | Admitting: *Deleted

## 2013-03-02 DIAGNOSIS — K660 Peritoneal adhesions (postprocedural) (postinfection): Secondary | ICD-10-CM | POA: Diagnosis present

## 2013-03-02 DIAGNOSIS — N3289 Other specified disorders of bladder: Secondary | ICD-10-CM | POA: Diagnosis present

## 2013-03-02 DIAGNOSIS — Z90710 Acquired absence of both cervix and uterus: Secondary | ICD-10-CM

## 2013-03-02 DIAGNOSIS — K432 Incisional hernia without obstruction or gangrene: Principal | ICD-10-CM | POA: Diagnosis present

## 2013-03-02 DIAGNOSIS — I10 Essential (primary) hypertension: Secondary | ICD-10-CM | POA: Diagnosis present

## 2013-03-02 DIAGNOSIS — Z87891 Personal history of nicotine dependence: Secondary | ICD-10-CM

## 2013-03-02 DIAGNOSIS — Z5331 Laparoscopic surgical procedure converted to open procedure: Secondary | ICD-10-CM

## 2013-03-02 DIAGNOSIS — IMO0002 Reserved for concepts with insufficient information to code with codable children: Secondary | ICD-10-CM | POA: Diagnosis not present

## 2013-03-02 HISTORY — PX: CYSTOSTOMY: SHX155

## 2013-03-02 HISTORY — PX: INCISIONAL HERNIA REPAIR: SHX193

## 2013-03-02 SURGERY — REPAIR, HERNIA, INCISIONAL, LAPAROSCOPIC
Anesthesia: General | Site: Bladder | Wound class: Clean Contaminated

## 2013-03-02 MED ORDER — CEFAZOLIN SODIUM-DEXTROSE 2-3 GM-% IV SOLR
2.0000 g | INTRAVENOUS | Status: AC
  Administered 2013-03-02: 2 g via INTRAVENOUS

## 2013-03-02 MED ORDER — HYDROMORPHONE HCL PF 1 MG/ML IJ SOLN
INTRAMUSCULAR | Status: AC
  Filled 2013-03-02: qty 1

## 2013-03-02 MED ORDER — PROMETHAZINE HCL 25 MG/ML IJ SOLN: 6.2500 mg | INTRAMUSCULAR | Status: DC | PRN

## 2013-03-02 MED ORDER — ONDANSETRON HCL 4 MG PO TABS: 4.0000 mg | ORAL_TABLET | Freq: Four times a day (QID) | ORAL | Status: DC | PRN

## 2013-03-02 MED ORDER — HYDROCHLOROTHIAZIDE 25 MG PO TABS
25.0000 mg | ORAL_TABLET | Freq: Every day | ORAL | Status: DC
  Administered 2013-03-02 – 2013-03-03 (×2): 25 mg via ORAL
  Filled 2013-03-02 (×3): qty 1

## 2013-03-02 MED ORDER — DIPHENHYDRAMINE HCL 50 MG/ML IJ SOLN: 12.5000 mg | Freq: Four times a day (QID) | INTRAMUSCULAR | Status: DC | PRN

## 2013-03-02 MED ORDER — LIDOCAINE HCL (PF) 2 % IJ SOLN
INTRAMUSCULAR | Status: DC | PRN
  Administered 2013-03-02: 30 mg

## 2013-03-02 MED ORDER — EPHEDRINE SULFATE 50 MG/ML IJ SOLN
INTRAMUSCULAR | Status: DC | PRN
  Administered 2013-03-02: 10 mg via INTRAVENOUS

## 2013-03-02 MED ORDER — LACTATED RINGERS IV SOLN
INTRAVENOUS | Status: DC
  Administered 2013-03-02: 1000 mL via INTRAVENOUS

## 2013-03-02 MED ORDER — VALSARTAN-HYDROCHLOROTHIAZIDE 320-25 MG PO TABS: 1.0000 | ORAL_TABLET | Freq: Every evening | ORAL | Status: DC

## 2013-03-02 MED ORDER — ENOXAPARIN SODIUM 40 MG/0.4ML ~~LOC~~ SOLN
40.0000 mg | Freq: Every day | SUBCUTANEOUS | Status: DC
  Administered 2013-03-03 – 2013-03-04 (×2): 40 mg via SUBCUTANEOUS
  Filled 2013-03-02 (×2): qty 0.4

## 2013-03-02 MED ORDER — ROCURONIUM BROMIDE 100 MG/10ML IV SOLN
INTRAVENOUS | Status: DC | PRN
  Administered 2013-03-02: 10 mg via INTRAVENOUS
  Administered 2013-03-02: 20 mg via INTRAVENOUS

## 2013-03-02 MED ORDER — SODIUM CHLORIDE 0.9 % IJ SOLN: 9.0000 mL | INTRAMUSCULAR | Status: DC | PRN

## 2013-03-02 MED ORDER — LACTATED RINGERS IV SOLN: INTRAVENOUS | Status: DC

## 2013-03-02 MED ORDER — HYDROCODONE-ACETAMINOPHEN 5-325 MG PO TABS: 1.0000 | ORAL_TABLET | ORAL | Status: DC | PRN

## 2013-03-02 MED ORDER — FENTANYL CITRATE 0.05 MG/ML IJ SOLN
INTRAMUSCULAR | Status: DC | PRN
  Administered 2013-03-02 (×3): 50 ug via INTRAVENOUS
  Administered 2013-03-02: 100 ug via INTRAVENOUS

## 2013-03-02 MED ORDER — CIPROFLOXACIN IN D5W 400 MG/200ML IV SOLN
400.0000 mg | Freq: Two times a day (BID) | INTRAVENOUS | Status: AC
  Administered 2013-03-02: 400 mg via INTRAVENOUS
  Filled 2013-03-02: qty 200

## 2013-03-02 MED ORDER — DIPHENHYDRAMINE HCL 12.5 MG/5ML PO ELIX: 12.5000 mg | ORAL_SOLUTION | Freq: Four times a day (QID) | ORAL | Status: DC | PRN

## 2013-03-02 MED ORDER — KETOROLAC TROMETHAMINE 30 MG/ML IJ SOLN
INTRAMUSCULAR | Status: DC | PRN
  Administered 2013-03-02: 30 mg via INTRAVENOUS

## 2013-03-02 MED ORDER — POTASSIUM CHLORIDE IN NACL 20-0.9 MEQ/L-% IV SOLN
INTRAVENOUS | Status: DC
  Administered 2013-03-02 – 2013-03-04 (×4): via INTRAVENOUS
  Filled 2013-03-02 (×6): qty 1000

## 2013-03-02 MED ORDER — GLYCOPYRROLATE 0.2 MG/ML IJ SOLN
INTRAMUSCULAR | Status: DC | PRN
  Administered 2013-03-02: 0.6 mg via INTRAVENOUS

## 2013-03-02 MED ORDER — IRBESARTAN 300 MG PO TABS
300.0000 mg | ORAL_TABLET | Freq: Every day | ORAL | Status: DC
  Administered 2013-03-02 – 2013-03-03 (×2): 300 mg via ORAL
  Filled 2013-03-02 (×3): qty 1

## 2013-03-02 MED ORDER — MIDAZOLAM HCL 5 MG/5ML IJ SOLN
INTRAMUSCULAR | Status: DC | PRN
  Administered 2013-03-02: 2 mg via INTRAVENOUS

## 2013-03-02 MED ORDER — BUPIVACAINE HCL (PF) 0.5 % IJ SOLN
INTRAMUSCULAR | Status: DC | PRN
  Administered 2013-03-02: 20 mL

## 2013-03-02 MED ORDER — BUPIVACAINE HCL (PF) 0.5 % IJ SOLN
INTRAMUSCULAR | Status: AC
  Filled 2013-03-02: qty 30

## 2013-03-02 MED ORDER — NALOXONE HCL 0.4 MG/ML IJ SOLN: 0.4000 mg | INTRAMUSCULAR | Status: DC | PRN

## 2013-03-02 MED ORDER — NAPHAZOLINE HCL 0.1 % OP SOLN
1.0000 [drp] | Freq: Four times a day (QID) | OPHTHALMIC | Status: DC | PRN
  Filled 2013-03-02: qty 15

## 2013-03-02 MED ORDER — CEFAZOLIN SODIUM-DEXTROSE 2-3 GM-% IV SOLR
INTRAVENOUS | Status: AC
  Filled 2013-03-02: qty 50

## 2013-03-02 MED ORDER — ONDANSETRON HCL 4 MG/2ML IJ SOLN: 4.0000 mg | Freq: Four times a day (QID) | INTRAMUSCULAR | Status: DC | PRN

## 2013-03-02 MED ORDER — HYDROMORPHONE HCL PF 1 MG/ML IJ SOLN
0.2500 mg | INTRAMUSCULAR | Status: DC | PRN
  Administered 2013-03-02 (×4): 0.5 mg via INTRAVENOUS

## 2013-03-02 MED ORDER — POTASSIUM CHLORIDE IN NACL 20-0.9 MEQ/L-% IV SOLN
INTRAVENOUS | Status: AC
  Filled 2013-03-02: qty 1000

## 2013-03-02 MED ORDER — 0.9 % SODIUM CHLORIDE (POUR BTL) OPTIME
TOPICAL | Status: DC | PRN
  Administered 2013-03-02: 2000 mL

## 2013-03-02 MED ORDER — HYDROMORPHONE 0.3 MG/ML IV SOLN
INTRAVENOUS | Status: AC
  Administered 2013-03-02: 0.3 mg
  Filled 2013-03-02: qty 25

## 2013-03-02 MED ORDER — HYDROMORPHONE 0.3 MG/ML IV SOLN
INTRAVENOUS | Status: DC
  Administered 2013-03-02: 0.9 mg via INTRAVENOUS
  Administered 2013-03-02: 1.8 mg via INTRAVENOUS
  Administered 2013-03-02: 0.6 mg via INTRAVENOUS
  Administered 2013-03-03: 04:00:00 via INTRAVENOUS
  Administered 2013-03-03: 1.8 mg via INTRAVENOUS
  Administered 2013-03-03 (×4): 1.5 mg via INTRAVENOUS
  Administered 2013-03-03: 1.2 mg via INTRAVENOUS
  Administered 2013-03-04: 01:00:00 via INTRAVENOUS
  Administered 2013-03-04: 2.1 mg via INTRAVENOUS
  Filled 2013-03-02 (×2): qty 25

## 2013-03-02 MED ORDER — AMLODIPINE BESYLATE 5 MG PO TABS
5.0000 mg | ORAL_TABLET | Freq: Every evening | ORAL | Status: DC
  Administered 2013-03-02 – 2013-03-03 (×2): 5 mg via ORAL
  Filled 2013-03-02 (×3): qty 1

## 2013-03-02 MED ORDER — SUCCINYLCHOLINE CHLORIDE 20 MG/ML IJ SOLN
INTRAMUSCULAR | Status: DC | PRN
  Administered 2013-03-02: 100 mg via INTRAVENOUS

## 2013-03-02 MED ORDER — PROPOFOL 10 MG/ML IV BOLUS
INTRAVENOUS | Status: DC | PRN
  Administered 2013-03-02: 150 mg via INTRAVENOUS

## 2013-03-02 MED ORDER — NEOSTIGMINE METHYLSULFATE 1 MG/ML IJ SOLN
INTRAMUSCULAR | Status: DC | PRN
  Administered 2013-03-02: 4 mg via INTRAVENOUS

## 2013-03-02 MED ORDER — HYDROMORPHONE HCL PF 1 MG/ML IJ SOLN: 1.0000 mg | INTRAMUSCULAR | Status: DC | PRN

## 2013-03-02 MED ORDER — ONDANSETRON HCL 4 MG/2ML IJ SOLN
4.0000 mg | Freq: Four times a day (QID) | INTRAMUSCULAR | Status: DC | PRN
  Administered 2013-03-03: 4 mg via INTRAVENOUS
  Filled 2013-03-02: qty 2

## 2013-03-02 MED ORDER — ONDANSETRON HCL 4 MG/2ML IJ SOLN
INTRAMUSCULAR | Status: DC | PRN
  Administered 2013-03-02: 4 mg via INTRAVENOUS

## 2013-03-02 SURGICAL SUPPLY — 59 items
BAG URINE DRAINAGE (UROLOGICAL SUPPLIES) ×4 IMPLANT
BANDAGE ADHESIVE 1X3 (GAUZE/BANDAGES/DRESSINGS) IMPLANT
BENZOIN TINCTURE PRP APPL 2/3 (GAUZE/BANDAGES/DRESSINGS) ×4 IMPLANT
BINDER ABD UNIV 12 45-62 (WOUND CARE) IMPLANT
BINDER ABDOMINAL 46IN 62IN (WOUND CARE)
CANISTER SUCTION 2500CC (MISCELLANEOUS) ×4 IMPLANT
CANNULA ENDOPATH XCEL 11M (ENDOMECHANICALS) ×4 IMPLANT
CHLORAPREP W/TINT 26ML (MISCELLANEOUS) ×4 IMPLANT
CLOTH BEACON ORANGE TIMEOUT ST (SAFETY) ×4 IMPLANT
CONNECTOR 5 IN 1 STRAIGHT STRL (MISCELLANEOUS) ×4 IMPLANT
DECANTER SPIKE VIAL GLASS SM (MISCELLANEOUS) IMPLANT
DERMABOND ADVANCED (GAUZE/BANDAGES/DRESSINGS) ×2
DERMABOND ADVANCED .7 DNX12 (GAUZE/BANDAGES/DRESSINGS) ×6 IMPLANT
DEVICE TROCAR PUNCTURE CLOSURE (ENDOMECHANICALS) ×4 IMPLANT
DRAIN CHANNEL 19F RND (DRAIN) ×4 IMPLANT
DRAPE LAPAROSCOPIC ABDOMINAL (DRAPES) ×4 IMPLANT
DRSG TEGADERM 2-3/8X2-3/4 SM (GAUZE/BANDAGES/DRESSINGS) IMPLANT
ELECT REM PT RETURN 9FT ADLT (ELECTROSURGICAL) ×4
ELECTRODE REM PT RTRN 9FT ADLT (ELECTROSURGICAL) ×3 IMPLANT
EVACUATOR SILICONE 100CC (DRAIN) ×4 IMPLANT
GLOVE BIOGEL PI IND STRL 7.0 (GLOVE) ×3 IMPLANT
GLOVE BIOGEL PI INDICATOR 7.0 (GLOVE) ×1
GLOVE SURG SIGNA 7.5 PF LTX (GLOVE) ×8 IMPLANT
GOWN STRL NON-REIN LRG LVL3 (GOWN DISPOSABLE) ×4 IMPLANT
GOWN STRL REIN XL XLG (GOWN DISPOSABLE) ×8 IMPLANT
HAND ACTIVATED (MISCELLANEOUS) ×4 IMPLANT
KIT BASIN OR (CUSTOM PROCEDURE TRAY) ×4 IMPLANT
MARKER SKIN DUAL TIP RULER LAB (MISCELLANEOUS) ×4 IMPLANT
NEEDLE INSUFFLATION 14GA 120MM (NEEDLE) IMPLANT
NEEDLE SPNL 22GX3.5 QUINCKE BK (NEEDLE) ×4 IMPLANT
NS IRRIG 1000ML POUR BTL (IV SOLUTION) ×4 IMPLANT
PENCIL BUTTON HOLSTER BLD 10FT (ELECTRODE) ×4 IMPLANT
SCISSORS LAP 5X35 DISP (ENDOMECHANICALS) ×4 IMPLANT
SET IRRIG TUBING LAPAROSCOPIC (IRRIGATION / IRRIGATOR) ×4 IMPLANT
SOLUTION ANTI FOG 6CC (MISCELLANEOUS) ×4 IMPLANT
SPONGE DRAIN TRACH 4X4 STRL 2S (GAUZE/BANDAGES/DRESSINGS) ×4 IMPLANT
SPONGE LAP 18X18 X RAY DECT (DISPOSABLE) ×8 IMPLANT
STRIP CLOSURE SKIN 1/2X4 (GAUZE/BANDAGES/DRESSINGS) ×8 IMPLANT
SUT ETHILON 2 0 PS N (SUTURE) ×4 IMPLANT
SUT MNCRL AB 4-0 PS2 18 (SUTURE) ×4 IMPLANT
SUT NOVA NAB DX-16 0-1 5-0 T12 (SUTURE) ×8 IMPLANT
SUT PDS AB 1 TP1 96 (SUTURE) ×8 IMPLANT
SUT VIC AB 2-0 CT2 27 (SUTURE) ×4 IMPLANT
SUT VIC AB 2-0 SH 27 (SUTURE) ×2
SUT VIC AB 2-0 SH 27X BRD (SUTURE) ×6 IMPLANT
SUT VIC AB 3-0 SH 27 (SUTURE) ×2
SUT VIC AB 3-0 SH 27XBRD (SUTURE) ×6 IMPLANT
SYR BULB IRRIGATION 50ML (SYRINGE) ×4 IMPLANT
TACKER 5MM HERNIA 3.5CML NAB (ENDOMECHANICALS) IMPLANT
TIPS TEFLON (MISCELLANEOUS) ×4 IMPLANT
TOWEL OR 17X26 10 PK STRL BLUE (TOWEL DISPOSABLE) ×4 IMPLANT
TRAY FOLEY CATH 14FRSI W/METER (CATHETERS) ×4 IMPLANT
TRAY FOLEY CATH 16FRSI W/METER (SET/KITS/TRAYS/PACK) IMPLANT
TRAY LAP CHOLE (CUSTOM PROCEDURE TRAY) ×4 IMPLANT
TROCAR BLADELESS OPT 5 75 (ENDOMECHANICALS) ×4 IMPLANT
TROCAR XCEL BLUNT TIP 100MML (ENDOMECHANICALS) IMPLANT
TROCAR XCEL NON-BLD 11X100MML (ENDOMECHANICALS) ×4 IMPLANT
TUBING INSUFFLATION 10FT LAP (TUBING) ×4 IMPLANT
WATER STERILE IRR 1000ML POUR (IV SOLUTION) ×4 IMPLANT

## 2013-03-02 NOTE — Consult Note (Signed)
Reason for Consult: Cystotomy, Intraoperative Consultation  Referring Physician: Barrie Dunker MD  Helen [name] is an 52 y.o. female.  HPI:   1 - Small Cystotomy - Pt undergoing repair / revision of complex incisional hernia noted to have small cystotomy during tissue mobilization. Intraoperative consultation sought for evaluation and closure. Small cystotomy noted 3.5cm in length at anterior-dome, well away from patent-appearing ureteral orifices.  Past Medical History  Diagnosis Date  . HTN (hypertension)   . Environmental allergies 02-27-13    OTC meds daily  . Headache(784.0) 02-27-13    occ. migraines  . Transfusion history     childhood s/p Tonsillectomy    Past Surgical History  Procedure Laterality Date  . Hernia repair      x's 2   . Knee surgery      Left  . Total abdominal hysterectomy w/ bilateral salpingoophorectomy    . Neck surgery      Disc replaced with a steel plate  . Tonsillectomy      childhoood  . Abdominal hysterectomy      Family History  Problem Relation Age of Onset  . Colon cancer Father   . Prostate cancer Father   . Heart disease Mother   . Heart disease Father   . Stroke Mother   . Hypertension Mother   . Hypertension Father   . Kidney disease Maternal Grandfather   . Diabetes Maternal Grandfather   . Sudden death Paternal Grandfather   . Sudden death Paternal Uncle     Cardiac issues    Social History:  reports that she quit smoking about 7 years ago. She has never used smokeless tobacco. She reports that  drinks alcohol. She reports that she does not use illicit drugs.  Allergies:  Allergies  Allergen Reactions  . Percocet (Oxycodone-Acetaminophen) Swelling    Medications: I have reviewed the patient's current medications.  No results found for this or any previous visit (from the past 48 hour(s)).  No results found.  Review of Systems  Unable to perform ROS: patient nonverbal   Blood pressure 107/58, pulse 73, temperature  98.2 F (36.8 C), temperature source Oral, resp. rate 13, SpO2 100.00%. Physical Exam  Constitutional: She appears well-developed and well-nourished.  Pt under general anesthesia in OR 1 [place]  GI:  Open abdomen with small cystotomy as per separate operative report.  Genitourinary: Vagina normal.  Neurological:  GCS 3T    Assessment/Plan: 1 - Small Cystotomy - Small cystotomy closed primarily in two layers, irrigated and found to be water tight as per separate operative note.   JP drain to remain until output <100cc per 24 hr or JP fluid creatinine checked and similar to serum.  Foley to remain at discharge. I will arrange for outpatient f/u with cystogram at our office within 2 weeks, and if healed, trial of void.  2 - Please call with any questions.  Evette Diclemente 03/02/2013, 11:06 AM

## 2013-03-02 NOTE — Anesthesia Postprocedure Evaluation (Signed)
Anesthesia Post Note  Patient: Helen [name]  Procedure(s) Performed: Procedure(s) (LRB): LAPAROSCOPIC INCISIONAL HERNIA CONVERTED TO OPEN  (N/A) CYSTOSTOMY CLOSURE  (N/A)  Anesthesia type: General  Patient location: PACU  Post pain: Pain level controlled  Post assessment: Post-op Vital signs reviewed  Last Vitals:  Filed Vitals:   03/02/13 1237  BP:   Pulse:   Temp:   Resp: 7    Post vital signs: Reviewed  Level of consciousness: sedated  Complications: No apparent anesthesia complications

## 2013-03-02 NOTE — Anesthesia Preprocedure Evaluation (Signed)
Anesthesia Evaluation  Patient identified by MRN, date of birth, ID band Patient awake    Reviewed: Allergy & Precautions, H&P , NPO status , Patient's Chart, lab work & pertinent test results  Airway Mallampati: II TM Distance: >3 FB Neck ROM: Full    Dental  (+) Teeth Intact, Caps and Dental Advisory Given,    Pulmonary neg pulmonary ROS,  breath sounds clear to auscultation  Pulmonary exam normal       Cardiovascular hypertension, Pt. on medications Rhythm:Regular Rate:Normal     Neuro/Psych negative neurological ROS  negative psych ROS   GI/Hepatic negative GI ROS, Neg liver ROS,   Endo/Other  negative endocrine ROS  Renal/GU negative Renal ROS  negative genitourinary   Musculoskeletal negative musculoskeletal ROS (+)   Abdominal   Peds  Hematology negative hematology ROS (+)   Anesthesia Other Findings   Reproductive/Obstetrics negative OB ROS                           Anesthesia Physical Anesthesia Plan  ASA: II  Anesthesia Plan: General   Post-op Pain Management:    Induction: Intravenous  Airway Management Planned: Oral ETT  Additional Equipment:   Intra-op Plan:   Post-operative Plan: Extubation in OR  Informed Consent: I have reviewed the patients History and Physical, chart, labs and discussed the procedure including the risks, benefits and alternatives for the proposed anesthesia with the patient or authorized representative who has indicated his/her understanding and acceptance.   Dental advisory given  Plan Discussed with: CRNA  Anesthesia Plan Comments:         Anesthesia Quick Evaluation

## 2013-03-02 NOTE — Transfer of Care (Signed)
Immediate Anesthesia Transfer of Care Note  Patient: Helen [name]  Procedure(s) Performed: Procedure(s) (LRB): LAPAROSCOPIC INCISIONAL HERNIA CONVERTED TO OPEN  (N/A) CYSTOSTOMY CLOSURE  (N/A)  Patient Location: PACU  Anesthesia Type: General  Level of Consciousness: sedated, patient cooperative and responds to stimulaton  Airway & Oxygen Therapy: Patient Spontanous Breathing and Patient connected to face mask oxgen  Post-op Assessment: Report given to PACU RN and Post -op Vital signs reviewed and stable  Post vital signs: Reviewed and stable  Complications: No apparent anesthesia complications

## 2013-03-02 NOTE — Op Note (Signed)
LAPAROSCOPIC INCISIONAL HERNIA CONVERTED TO OPEN   Procedure Note  Helen [name] 03/02/2013   Pre-op Diagnosis: incisional hernia     Post-op Diagnosis: same  Procedure(s): LAPAROSCOPIC INCISIONAL HERNIA CONVERTED TO OPEN  CYSTOSTOMY CLOSURE   Surgeon(s): Shelly Rubenstein, MD Sebastian Ache, MD  Anesthesia: General  Staff:  Circulator: Gerda Diss, RN Scrub Person: Clarnce Flock, CST; Misty [place], [place]  Estimated Blood Loss: Minimal               Specimens:           Helen [name]   Date: 03/02/2013  Time: 10:03 AM

## 2013-03-02 NOTE — Interval H&P Note (Signed)
History and Physical Interval Note: no change in H and P  03/02/2013 7:48 AM  Helen [name]  has presented today for surgery, with the diagnosis of incisional hernia  The various methods of treatment have been discussed with the patient and family. After consideration of risks, benefits and other options for treatment, the patient has consented to  Procedure(s): LAPAROSCOPIC INCISIONAL HERNIA (N/A) INSERTION OF MESH (N/A) as a surgical intervention .  The patient's history has been reviewed, patient examined, no change in status, stable for surgery.  I have reviewed the patient's chart and labs.  Questions were answered to the patient's satisfaction.     Marleena Shubert A

## 2013-03-02 NOTE — Brief Op Note (Signed)
03/02/2013  11:01 AM  PATIENT:  Helen [name]  52 y.o. female  PRE-OPERATIVE DIAGNOSIS:  cystotomy  POST-OPERATIVE DIAGNOSIS:  cystotomy  PROCEDURE:  Cystotomy closure  SURGEON:  Surgeon(s) and Role: Panel 1:    * Shelly Rubenstein, MD - Primary  Panel 2:    * Sebastian Ache, MD - Primary  PHYSICIAN ASSISTANT:   ASSISTANTS: none   ANESTHESIA:   general  EBL:  Total I/O In: 900 [I.V.:900] Out: -   BLOOD ADMINISTERED:none  DRAINS: JP to bulb suction   LOCAL MEDICATIONS USED:  NONE  SPECIMEN:  No Specimen  DISPOSITION OF SPECIMEN:  N/A  COUNTS:  YES  TOURNIQUET:  * No tourniquets in log *  DICTATION: .Other Dictation: Dictation Number [id number]  PLAN OF CARE: Admit to inpatient   PATIENT DISPOSITION:  PACU - hemodynamically stable.   Delay start of Pharmacological VTE agent (>24hrs) due to surgical blood loss or risk of bleeding: not applicable

## 2013-03-02 NOTE — Op Note (Signed)
[name], [name] NO.:  [id number]  MEDICAL RECORD NO.:  [id number]  LOCATION:  1522                         FACILITY:  [facility]  PHYSICIAN:  Sebastian Ache, MD     DATE OF BIRTH:  [birth year]  DATE OF PROCEDURE:  03/02/2013 DATE OF DISCHARGE:                              OPERATIVE REPORT   DIAGNOSIS:  Small cystotomy.  PROCEDURE:  Cystotomy closure.  ESTIMATED BLOOD LOSS:  Nil.  COMPLICATIONS:  None.  SPECIMENS:  None.  DRAINS:  Jackson-Pratt drain with suction.  PREOPERATIVE FINDINGS: 1. Small cystotomy approximately 3.5 cm in length in the anterior dome     of the bladder. 2. Noninvolvement of the posterior wall or ureteral orifices. 3. Watertight closure verified by irrigation following closure.  INDICATIONS:  This patient is undergoing hernia repair today under the care of Dr. Magnus Ivan.  She has had an extensive surgical history including prior hysterectomy as well as prior incisional hernia repair. She was undergoing revision surgery today.  The patient was noted to have extensive adhesions between her lower abdominal wall, small bowel, and urinary bladder. A small cystotomy was created, during maneuvers to bring up the structures and intraoperative urologic consultation was sought.    PROCEDURE IN DETAIL: Inspection of the operative field at the time of consultation revealed small cystotomy in the anterior dome of the bladder and very extensive adhesions in the area consistent with plastering of the bladder against the previous incision and obliteration of the previous space of Retzius.  Via the cystotomy, the ureteral orifices were visualized and efflux of clear urine was noted bilaterally and the cystotomy was well away from this location.  It was felt that closure would be amenable to simple closure as such 3-0 Vicryl was used to reapproximate the seromuscular layer from one end to the other followed by second imbricating layer of 2-0  Vicryl.  The bladder was then filled with 250 mL, found to be water tight.  The procedure was then once again turned over to Dr. Magnus Ivan, who also placed a Jackson-Pratt drain in the pelvis as well.  There were no perioperative complications.          ______________________________ Sebastian Ache, MD     TM/MEDQ  D:  03/02/2013  T:  03/02/2013  Job:  [id number]

## 2013-03-02 NOTE — Op Note (Signed)
NAMEHUDSYN, BARICH NO.:  [id number]  MEDICAL RECORD NO.:  [id number]  LOCATION:  1522                         FACILITY:  [facility]  PHYSICIAN:  [name], M.D. DATE OF BIRTH:  [birth year]  DATE OF PROCEDURE:  03/02/2013 DATE OF DISCHARGE:                              OPERATIVE REPORT   PREOPERATIVE DIAGNOSIS:  Incisional hernia.  POSTOPERATIVE DIAGNOSIS:  Incisional hernia.  PROCEDURE: 1. Laparoscopic converted to open incisional hernia repair with mesh 2. Repair of bladder injury.  SURGEON:  [name], MD  CO-SURGEON:  Sebastian Ache, MD  ANESTHESIA:  General endotracheal anesthesia.  ESTIMATED BLOOD LOSS:  Minimal.  INDICATIONS:  Rufus Cypert is a 52 year old female, who presents with pain and apparent recurrent hernia and a left lower abdominal Pfannenstiel incision.  She has had a previous hysterectomy and then has had a hernia repair twice in the past in the right lower quadrant.  Decision was made to proceed with laparoscopic repair.  FINDINGS:  The patient was found to have large amount of adhesions to her abdominal wall, which were taken down bluntly.  The bladder was found to be adherent to the abdominal wall at the area of the hernia. The bladder was entered as I attempted to take it down from the abdominal wall.  Therefore, the decision was made to convert to an open procedure.  Urology was consulted for repair of the bladder.  PROCEDURE IN DETAIL:  The patient was brought to the operating room, identified as Nani Gasser.  She  was placed supine on the operative table and general anesthesia was induced.  Her abdomen was then prepped and draped in usual sterile fashion.  I made a small vertical incision just above the umbilicus.  I took this down to the fascia which was opened with scalpel.  Hemostat was used to pass through the peritoneal cavity under direct vision.  A 0 Vicryl pursestring suture was then placed around the  fascial opening.  The Hasson port was placed through the opening and insufflation of the abdomen was begun.  The patient was found to have a large amount of adhesions of omentum and small bowel to the midline.  I placed two 5 mm trocars in the patient's right lower quadrant under direct vision.  I then performed laparoscopic lysis of adhesions, easily bring down these adhesions from the abdominal wall. There was only one loop of small bowel loosely adherent to the abdominal wall.  I then cut down to the level of the pelvis where the previous Pfannenstiel incision was placed.  The patient had a large amount of adhesions in this area.  As I attempted to take down these adhesions, it was found that the bladder was involved in the hernia sac and was easily entered as I tried to peel it from the abdominal wall.  At this point, decision was made to convert to an open procedure.  Prior to this, a Foley catheter was inserted.  I could see it easily going into the bladder before the balloon was inflated.  At this point, I removed all trocars and deflated the abdomen.  I then opened the Pfannenstiel incision with the  scalpel.  I took this down through the fascia with electrocautery.  I then undermined the fascia superior and inferiorly. I then opened the patient's midline with the cautery.  I then opened the peritoneum the entire length of the incision.  Open bladder was easily identified and grasped with Allis clamps.  The patient had 2 loops of small bowel stuck into the pelvis and I had to perform further lysis of adhesions to free the small bowel above the pelvis.  Dr. Sebastian Ache from Urology then presented to the room and performed a bladder repair and will dictate his part separately.  At this point, since the bladder was open, I decided to forego closure of the fascial defect which was actually quite small with mesh.  I was able to reapproximate fascial defect in the midline with a  running 2-0 Vicryl suture.  I then closed the patient's fascia at the Pfannenstiel incision with running #1 looped PDS suture.  Excellent closure of the fascia appeared to be achieved.  I then anesthetized the fascia with Marcaine.  I irrigated the wounds with saline.  I then closed all incisions with 4-0 Monocryl subcuticular sutures and then Dermabond.  Gauze and tape were applied.  The patient tolerated the procedure well.  All counts were correct at the end of procedure.  The patient was then extubated in the operating room and taken in stable condition to recovery room.     [name], M.D.     DB/MEDQ  D:  03/02/2013  T:  03/02/2013  Job:  [id number]

## 2013-03-03 LAB — BASIC METABOLIC PANEL
CO2: 33 mEq/L — ABNORMAL HIGH (ref 19–32)
GFR calc non Af Amer: >90 mL/min (ref >90)
Glucose, Bld: 88 mg/dL (ref 70–99)
Potassium: 3.8 mEq/L (ref 3.5–5.1)
Sodium: 140 mEq/L (ref 135–145)

## 2013-03-03 LAB — CBC
Hemoglobin: 12.1 g/dL (ref 12.0–15.0)
RBC: 4.28 MIL/uL (ref 3.87–5.11)
WBC: 9.4 10*3/uL (ref 4.0–10.5)

## 2013-03-03 LAB — CREATININE, FLUID (PLEURAL, PERITONEAL, JP DRAINAGE)

## 2013-03-03 MED ORDER — OXYBUTYNIN CHLORIDE 5 MG PO TABS
5.0000 mg | ORAL_TABLET | Freq: Three times a day (TID) | ORAL | Status: DC
  Administered 2013-03-03 – 2013-03-04 (×4): 5 mg via ORAL
  Filled 2013-03-03 (×6): qty 1

## 2013-03-03 NOTE — Progress Notes (Signed)
1 Day Post-Op Subjective: Patient reports bladder spasms. She has no nausea.  Objective: Vital signs in last 24 hours: Temp:  [97.9 F (36.6 C)-98.9 F (37.2 C)] 98.9 F (37.2 C) (06/28 0538) Pulse Rate:  [73-102] 91 (06/28 0538) Resp:  [7-20] 12 (06/28 0538) BP: (90-127)/(51-82) 90/51 mmHg (06/28 0538) SpO2:  [93 %-100 %] 97 % (06/28 0538) Weight:  [195 lb 8 oz (88.678 kg)] 195 lb 8 oz (88.678 kg) (06/27 1115)  Intake/Output from previous day: 06/27 0701 - 06/28 0700 In: 3015 [P.O.:360; I.V.:2615] Out: 1655 [Urine:1590; Drains:65] Intake/Output this shift:    Physical Exam:  Constitutional: Vital signs reviewed. WD WN in NAD   Eyes: PERRL, No scleral icterus.      Lab Results:  Recent Labs  03/03/13 0431  HGB 12.1  HCT 38.7   BMET  Recent Labs  03/03/13 0431  NA 140  K 3.8  CL 100  CO2 33*  GLUCOSE 88  BUN 10  CREATININE 0.73  CALCIUM 8.9   No results found for this basename: LABPT, INR,  in the last 72 hours No results found for this basename: LABURIN,  in the last 72 hours Results for orders placed during the hospital encounter of 02/27/13  SURGICAL PCR SCREEN     Status: Abnormal   Collection Time    02/27/13  8:45 AM      Result Value Range Status   MRSA, PCR NEGATIVE  NEGATIVE Final   Staphylococcus aureus POSITIVE (*) NEGATIVE Final   Comment:            The Xpert SA Assay (FDA     approved for NASAL specimens     in patients over 20 years of age),     is one component of     a comprehensive surveillance     program.  Test performance has     been validated by [employer] for patients greater     than or equal to 65 year old.     It is not intended     to diagnose infection nor to     guide or monitor treatment.    Studies/Results: No results found.  Assessment/Plan:   Postoperative day #1 closure of incidental cystotomy. Urinary output is adequate. Drainage and 65 cc. Biggest complaint is having bladder spasms. I will order  oxybutynin to take on a when necessary basis. I will also send J-P drain fluid for creatinine. I will let her to home with the oxybutynin, to take on a when necessary basis. Sent home with catheter, and Dr. Berneice Heinrich will followup   LOS: 1 day   Marcine Matar M 03/03/2013, 7:01 AM

## 2013-03-03 NOTE — Progress Notes (Signed)
Patient ID: Helen Young, female   DOB: 17-Sep-1960, 52 y.o.   MRN: [id number]  General Surgery - [facility] - Progress Note  POD# 1  Subjective: Patient comfortable.  Has not been out of bed to ambulate.  Became nauseated with grits for breakfast.  Objective: Vital signs in last 24 hours: Temp:  [97.9 F (36.6 C)-98.9 F (37.2 C)] 98.2 F (36.8 C) (06/28 1000) Pulse Rate:  [73-97] 83 (06/28 1000) Resp:  [7-20] 18 (06/28 1000) BP: (90-133)/(51-83) 133/83 mmHg (06/28 1000) SpO2:  [93 %-100 %] 96 % (06/28 1000) Weight:  [195 lb 8 oz (88.678 kg)] 195 lb 8 oz (88.678 kg) (06/27 1115) Last BM Date: 03/02/13  Intake/Output from previous day: 06/27 0701 - 06/28 0700 In: 3015 [P.O.:360; I.V.:2615] Out: 1655 [Urine:1590; Drains:65]  Exam: HEENT - clear, not icteric Neck - soft Chest - clear bilaterally Cor - RRR, no murmur Abd - soft without distension; drain with thin serosanguinous; few BS present; wound clear and dry; Foley light pink in color Ext - no significant edema Neuro - grossly intact, no focal deficits  Lab Results:   Recent Labs  03/03/13 0431  WBC 9.4  HGB 12.1  HCT 38.7  PLT 278     Recent Labs  03/03/13 0431  NA 140  K 3.8  CL 100  CO2 33*  GLUCOSE 88  BUN 10  CREATININE 0.73  CALCIUM 8.9    Studies/Results: No results found.  Assessment / Plan: 1.  Status post open VH repair with mesh, bladder injury  Monitor drain output  Monitor urine character - leave Foley in place  OOB, ambulate in halls  Advance diet as tolerated  Home 1-2 days  Velora Heckler, MD, [facility] Office: [phone]  03/03/2013

## 2013-03-04 LAB — CBC
HCT: 35 % — ABNORMAL LOW (ref 36.0–46.0)
Hemoglobin: 11.2 g/dL — ABNORMAL LOW (ref 12.0–15.0)
RDW: 12.2 % (ref 11.5–15.5)
WBC: 10.8 10*3/uL — ABNORMAL HIGH (ref 4.0–10.5)

## 2013-03-04 LAB — BASIC METABOLIC PANEL
BUN: 7 mg/dL (ref 6–23)
Chloride: 101 mEq/L (ref 96–112)
GFR calc Af Amer: >90 mL/min (ref >90)
Potassium: 4 mEq/L (ref 3.5–5.1)

## 2013-03-04 MED ORDER — HYDROCODONE-ACETAMINOPHEN 5-325 MG PO TABS: 1.0000 | ORAL_TABLET | ORAL | Status: DC | PRN

## 2013-03-04 NOTE — Progress Notes (Signed)
Patient ID: Helen Young, female   DOB: 1961/07/23, 52 y.o.   MRN: [id number]  General Surgery - [facility] - Progress Note  POD# 2  Subjective: Patient more comfortable this AM.  Tolerating full liquid diet.  Wants to go home today.  Objective: Vital signs in last 24 hours: Temp:  [97.9 F (36.6 C)-99.3 F (37.4 C)] 97.9 F (36.6 C) (06/29 0554) Pulse Rate:  [83-100] 88 (06/29 0554) Resp:  [10-18] 16 (06/29 0554) BP: (100-133)/(64-83) 100/65 mmHg (06/29 0554) SpO2:  [96 %-100 %] 99 % (06/29 0554) Last BM Date: 03/02/13  Intake/Output from previous day: 06/28 0701 - 06/29 0700 In: 2346.7 [P.O.:120; I.V.:2226.7] Out: 2745 [Urine:2725; Drains:20]  Exam: HEENT - clear, not icteric Neck - soft Chest - clear bilaterally Cor - RRR, no murmur Abd - soft without distension; BS present; wound clear and dry; JP with serosanguinous; Foley with light pink urine, good volume Ext - no significant edema Neuro - grossly intact, no focal deficits  Lab Results:   Recent Labs  03/03/13 0431 03/04/13 0402  WBC 9.4 10.8*  HGB 12.1 11.2*  HCT 38.7 35.0*  PLT 278 269     Recent Labs  03/03/13 0431 03/04/13 0402  NA 140 138  K 3.8 4.0  CL 100 101  CO2 33* 32  GLUCOSE 88 99  BUN 10 7  CREATININE 0.73 0.71  CALCIUM 8.9 8.7    Studies/Results: No results found.  Assessment / Plan: 1.  Status post open ventral hernia repair with mesh  Doing well - will discharge home today  Will leave Foley and JP drain in place - Dr. Magnus Ivan will remove at [facility] office  Rx for Vicodin  Follow up at [facility] office 1 week  Velora Heckler, MD, [facility] Office: [phone]  03/04/2013

## 2013-03-04 NOTE — Progress Notes (Signed)
Pt complaints of her bladder feeling full with the urge to void, assisted pt to standing at the bedside  But no urine noted. Notified the on call Dr Donell Beers and received a one time order to irrigate foley with 20 cc of saline. Irrigated foley with 20 cc of saline as order and was able to obtain 300 ml of pink color urine. Pt tolerated procedure well and verbalized feeling of relief. Will continue to monitor,

## 2013-03-04 NOTE — Progress Notes (Signed)
Can we advance pt's diet?.  She is on doing fine on full liquids.

## 2013-03-04 NOTE — Progress Notes (Signed)
PT INSTRUCTED ON HOW TO CHANGE HER LARGE FOLEY TO BAG TO A LEG BAG AND VICE VERSA.  SHE WAS ALSO INSTRUCTED HOW TO EMPTY AND RECORD HER JP DRAINAGE.  SHE VERBALIZED UNDERSTANDING AND DID A RETURN DEMONSTRATION.

## 2013-03-04 NOTE — Progress Notes (Signed)
2 Days Post-Op Subjective: Patient reports that her bladder spasms are better. Her bladder/catheter was irrigated last night about midnight, and about 300 cc of urine came out. It is slightly pink presently. It seems to be draining well.  Objective: Vital signs in last 24 hours: Temp:  [97.9 F (36.6 C)-99.3 F (37.4 C)] 97.9 F (36.6 C) (06/29 0554) Pulse Rate:  [83-100] 88 (06/29 0554) Resp:  [10-18] 16 (06/29 0554) BP: (100-133)/(64-83) 100/65 mmHg (06/29 0554) SpO2:  [96 %-100 %] 99 % (06/29 0554)  Intake/Output from previous day: 06/28 0701 - 06/29 0700 In: 2346.7 [P.O.:120; I.V.:2226.7] Out: 2745 [Urine:2725; Drains:20] Intake/Output this shift:    Physical Exam:  Constitutional: Vital signs reviewed. WD WN in NAD   Eyes: PERRL, No scleral icterus.     Lab Results:  Recent Labs  03/03/13 0431 03/04/13 0402  HGB 12.1 11.2*  HCT 38.7 35.0*   BMET  Recent Labs  03/03/13 0431 03/04/13 0402  NA 140 138  K 3.8 4.0  CL 100 101  CO2 33* 32  GLUCOSE 88 99  BUN 10 7  CREATININE 0.73 0.71  CALCIUM 8.9 8.7   No results found for this basename: LABPT, INR,  in the last 72 hours No results found for this basename: LABURIN,  in the last 72 hours Results for orders placed during the hospital encounter of 02/27/13  SURGICAL PCR SCREEN     Status: Abnormal   Collection Time    02/27/13  8:45 AM      Result Value Range Status   MRSA, PCR NEGATIVE  NEGATIVE Final   Staphylococcus aureus POSITIVE (*) NEGATIVE Final   Comment:            The Xpert SA Assay (FDA     approved for NASAL specimens     in patients over 57 years of age),     is one component of     a comprehensive surveillance     program.  Test performance has     been validated by [employer] for patients greater     than or equal to 20 year old.     It is not intended     to diagnose infection nor to     guide or monitor treatment.    Studies/Results: No results  found.  Assessment/Plan:   Status post cystorrhaphy 2 days ago for bladder injury. She seems to be doing well. Pelvic drain fluid is consistent with serum, can be removed at the discretion of general surgery. We will arrange followup after her discharge. We'll sign off of her care for now-please call if we are needed again during her hospitalization   LOS: 2 days   Marcine Matar M 03/04/2013, 8:18 AM

## 2013-03-04 NOTE — Plan of Care (Signed)
Problem: Phase II Progression Outcomes Goal: Foley discontinued Outcome: Not Applicable Date Met:  03/04/13 PT HOME WITH FOLEY  Problem: Discharge Progression Outcomes Goal: Tubes and drains discontinued if indicated Outcome: Not Met (add Reason) PT DISCHARGED WITH JP DRAIN.

## 2013-03-05 ENCOUNTER — Encounter (HOSPITAL_COMMUNITY): Payer: Self-pay | Admitting: Surgery

## 2013-03-05 NOTE — Discharge Summary (Signed)
Physician Discharge Summary  Patient ID: Helen Young MRN: [id number] DOB/AGE: Dec 25, 1960 52 y.o.  Admit date: 03/02/2013 Discharge date: 03/05/2013  Admission Diagnoses:  Discharge Diagnoses:  Active Problems:   * No active hospital problems. * incisional hernia Bladder injury  Discharged Condition: good  Hospital Course: She presented for a laparoscopic repair of incisional hernia. The procedure had to be converted to an open secondary to involvement in the bladder and a bladder injury. This was repaired by urology and she was taken in stable condition to regular surgical floor with a Foley catheter in place. She had an uneventful postoperative recovery and was able to be discharged postop day 2 with the Foley catheter in place.  Consults: urology  Significant Diagnostic Studies:   Treatments: surgery: lap converted to open incisional hernia repair    Repair of bladder injury  Discharge Exam: Blood pressure 100/65, pulse 88, temperature 97.9 F (36.6 C), temperature source Oral, resp. rate 16, height 5\' 5"  (1.651 m), weight 195 lb 8 oz (88.678 kg), SpO2 99.00%. General appearance: alert, cooperative and no distress Resp: clear to auscultation bilaterally Cardio: regular rate and rhythm, S1, S2 normal, no murmur, click, rub or gallop Incision/Wound: clean Foley in place  Disposition: 01-Home or Self Care  Discharge Orders   Future Appointments Provider Department Dept Phone   03/12/2013 3:10 PM Shelly Rubenstein, MD [facility], [place] [phone]   Future Orders Complete By Expires     Diet - low sodium heart healthy  As directed     Discharge instructions  As directed     Comments:      [facility]  HERNIA REPAIR POST OP INSTRUCTIONS  Always review your discharge instruction sheet given to you by the facility where your surgery was performed.  A  prescription for pain medication may be given to you upon discharge.  Take your pain medication  as prescribed.  If narcotic pain medicine is not needed, then you may take acetaminophen (Tylenol) or ibuprofen (Advil) as needed.  Take your usually prescribed medications unless otherwise directed.  If you need a refill on your pain medication, please contact your pharmacy.  They will contact our office to request authorization. Prescriptions will not be filled after 5 pm daily or on weekends.  You should follow a light diet the first 24 hours after arrival home, such as soup and crackers or toast.  Be sure to include plenty of fluids daily.  Resume your normal diet the day after surgery.  Most patients will experience some swelling and bruising around the surgical site.  Ice packs and reclining will help.  Swelling and bruising can take several days to resolve.   It is common to experience some constipation if taking pain medication after surgery.  Increasing fluid intake and taking a stool softener (such as Colace) will usually help or prevent this problem from occurring.  A mild laxative (Milk of Magnesia or Miralax) should be taken according to package directions if there are no bowel movements after 48 hours.  Unless discharge instructions indicate otherwise, you may remove your bandages 24-48 hours after surgery, and you may shower at that time.  You may have steri-strips (small skin tapes) in place directly over the incision.  These strips should be left on the skin for 7-10 days.  If your surgeon used skin glue on the incision, you may shower in 24 hours.  The glue will flake off over the next 2-3 weeks.  Any sutures  or staples will be removed at the office during your follow-up visit.  ACTIVITIES:  You may resume regular (light) daily activities beginning the next day-such as daily self-care, walking, climbing stairs-gradually increasing activities as tolerated.  You may have sexual intercourse when it is comfortable.  Refrain from any heavy lifting or straining until approved by your  doctor.  You may drive when you are no longer taking prescription pain medication, you can comfortably wear a seatbelt, and you can safely maneuver your car and apply brakes.  You should see your doctor in the office for a follow-up appointment approximately 2-3 weeks after your surgery.  Make sure that you call for this appointment within a day or two after you arrive home to insure a convenient appointment time.   WHEN TO CALL YOUR DOCTOR: Fever greater than 101.0 Inability to urinate Persistent nausea and/or vomiting Extreme swelling or bruising Continued bleeding from incision Increased pain, redness, or drainage from the incision  The clinic staff is available to answer your questions during regular business hours.  Please don't hesitate to call and ask to speak to one of the nurses for clinical concerns.  If you have a medical emergency, go to the nearest emergency room or call 911.  A surgeon from [facility] is always on call for the hospital.   [facility] [address], [place], [place]  [address]  [phone] ? [phone] ? FAX [phone]  [email or web address]    Discharge wound care:  As directed     Comments:      Leave Foley in place.  Empty Foley bag as needed.  Empty JP drain 2-3 times daily and as needed.  Record output volume.    Drain care  As directed     Scheduling Instructions:      Instruct patient in care of JP drain and Foley before discharge home today.    Increase activity slowly  As directed         Medication List         amLODipine 5 MG tablet  Commonly known as:  NORVASC  Take 5 mg by mouth every evening.     cetirizine 10 MG tablet  Commonly known as:  ZYRTEC  Take 10 mg by mouth at bedtime.     estrogens (conjugated) 0.625 MG tablet  Commonly known as:  PREMARIN  Take 0.625 mg by mouth every morning.     HYDROcodone-acetaminophen 5-325 MG per tablet  Commonly known  as:  NORCO/VICODIN  Take 1-2 tablets by mouth every 4 (four) hours as needed for pain.     meloxicam 7.5 MG tablet  Commonly known as:  MOBIC  Take 7.5 mg by mouth 2 (two) times daily as needed for pain.     mometasone 50 MCG/ACT nasal spray  Commonly known as:  NASONEX  Place 2 sprays into the nose every morning.     tetrahydrozoline 0.05 % ophthalmic solution  Place 1 drop into both eyes daily as needed (for dry eyes).     valsartan-hydrochlorothiazide 320-25 MG per tablet  Commonly known as:  DIOVAN-HCT  Take 1 tablet by mouth every evening.           Follow-up Information   Follow up with [facility] A, MD. Schedule an appointment as soon as possible for a visit in 1 week.   Contact information:   [address] [place] [address] [phone]  SignedAbigail Miyamoto A 03/05/2013, 3:58 PM

## 2013-03-10 ENCOUNTER — Inpatient Hospital Stay (HOSPITAL_COMMUNITY)
Admission: EM | Admit: 2013-03-10 | Discharge: 2013-03-14 | DRG: 813 | Disposition: A | Discharge status: Home / Self Care | Payer: BC Managed Care – PPO | Attending: Surgery | Admitting: Surgery

## 2013-03-10 ENCOUNTER — Emergency Department (HOSPITAL_COMMUNITY): Payer: BC Managed Care – PPO

## 2013-03-10 ENCOUNTER — Encounter (HOSPITAL_COMMUNITY): Payer: Self-pay

## 2013-03-10 DIAGNOSIS — Z9889 Other specified postprocedural states: Secondary | ICD-10-CM

## 2013-03-10 DIAGNOSIS — G8918 Other acute postprocedural pain: Secondary | ICD-10-CM

## 2013-03-10 DIAGNOSIS — I10 Essential (primary) hypertension: Secondary | ICD-10-CM | POA: Diagnosis present

## 2013-03-10 DIAGNOSIS — Z87891 Personal history of nicotine dependence: Secondary | ICD-10-CM

## 2013-03-10 DIAGNOSIS — K529 Noninfective gastroenteritis and colitis, unspecified: Secondary | ICD-10-CM

## 2013-03-10 DIAGNOSIS — R109 Unspecified abdominal pain: Secondary | ICD-10-CM

## 2013-03-10 DIAGNOSIS — K5289 Other specified noninfective gastroenteritis and colitis: Principal | ICD-10-CM | POA: Diagnosis present

## 2013-03-10 DIAGNOSIS — K560 Paralytic ileus: Secondary | ICD-10-CM | POA: Diagnosis present

## 2013-03-10 DIAGNOSIS — R112 Nausea with vomiting, unspecified: Secondary | ICD-10-CM

## 2013-03-10 LAB — CBC WITH DIFFERENTIAL/PLATELET
Hemoglobin: 13.2 g/dL (ref 12.0–15.0)
Lymphocytes Relative: 15 % (ref 12–46)
Lymphs Abs: 1.5 10*3/uL (ref 0.7–4.0)
MCH: 29.1 pg (ref 26.0–34.0)
Monocytes Relative: 5 % (ref 3–12)
Neutro Abs: 8 10*3/uL — ABNORMAL HIGH (ref 1.7–7.7)
Neutrophils Relative %: 79 % — ABNORMAL HIGH (ref 43–77)
Platelets: 467 10*3/uL — ABNORMAL HIGH (ref 150–400)
RBC: 4.54 MIL/uL (ref 3.87–5.11)
WBC: 10.1 10*3/uL (ref 4.0–10.5)

## 2013-03-10 LAB — URINALYSIS, ROUTINE W REFLEX MICROSCOPIC
Glucose, UA: NEGATIVE mg/dL
Nitrite: NEGATIVE
Protein, ur: NEGATIVE mg/dL
Specific Gravity, Urine: 1.014 (ref 1.005–1.030)
Urobilinogen, UA: 0.2 mg/dL (ref 0.0–1.0)

## 2013-03-10 LAB — BASIC METABOLIC PANEL
BUN: 13 mg/dL (ref 6–23)
Chloride: 100 mEq/L (ref 96–112)
Glucose, Bld: 114 mg/dL — ABNORMAL HIGH (ref 70–99)
Potassium: 4.1 mEq/L (ref 3.5–5.1)
Sodium: 137 mEq/L (ref 135–145)

## 2013-03-10 LAB — HEPATIC FUNCTION PANEL
AST: 15 U/L (ref 0–37)
Bilirubin, Direct: <0.1 mg/dL (ref 0.0–0.3)
Total Bilirubin: 0.4 mg/dL (ref 0.3–1.2)

## 2013-03-10 LAB — URINE MICROSCOPIC-ADD ON

## 2013-03-10 MED ORDER — ONDANSETRON HCL 4 MG/2ML IJ SOLN
4.0000 mg | INTRAMUSCULAR | Status: DC | PRN
  Administered 2013-03-10 – 2013-03-13 (×12): 4 mg via INTRAVENOUS
  Filled 2013-03-10 (×11): qty 2

## 2013-03-10 MED ORDER — ENOXAPARIN SODIUM 40 MG/0.4ML ~~LOC~~ SOLN
40.0000 mg | SUBCUTANEOUS | Status: DC
  Administered 2013-03-10 – 2013-03-14 (×5): 40 mg via SUBCUTANEOUS
  Filled 2013-03-10 (×6): qty 0.4

## 2013-03-10 MED ORDER — IOHEXOL 300 MG/ML  SOLN
100.0000 mL | Freq: Once | INTRAMUSCULAR | Status: AC | PRN
  Administered 2013-03-10: 100 mL via INTRAVENOUS

## 2013-03-10 MED ORDER — ONDANSETRON HCL 4 MG/2ML IJ SOLN
4.0000 mg | Freq: Once | INTRAMUSCULAR | Status: AC
  Administered 2013-03-10: 4 mg via INTRAVENOUS
  Filled 2013-03-10: qty 2

## 2013-03-10 MED ORDER — KCL-LACTATED RINGERS-D5W 20 MEQ/L IV SOLN
INTRAVENOUS | Status: DC
  Administered 2013-03-10 – 2013-03-14 (×11): via INTRAVENOUS
  Filled 2013-03-10 (×13): qty 1000

## 2013-03-10 MED ORDER — FLUTICASONE PROPIONATE 50 MCG/ACT NA SUSP
1.0000 | Freq: Every day | NASAL | Status: DC
  Administered 2013-03-11 – 2013-03-12 (×2): 1 via NASAL
  Administered 2013-03-13: 2 via NASAL
  Administered 2013-03-14: 1 via NASAL
  Filled 2013-03-10: qty 16

## 2013-03-10 MED ORDER — AMLODIPINE BESYLATE 5 MG PO TABS
5.0000 mg | ORAL_TABLET | Freq: Every evening | ORAL | Status: DC
  Administered 2013-03-11 – 2013-03-13 (×3): 5 mg via ORAL
  Filled 2013-03-10 (×6): qty 1

## 2013-03-10 MED ORDER — PANTOPRAZOLE SODIUM 40 MG IV SOLR
40.0000 mg | Freq: Every day | INTRAVENOUS | Status: DC
  Administered 2013-03-10 – 2013-03-13 (×4): 40 mg via INTRAVENOUS
  Filled 2013-03-10 (×6): qty 40

## 2013-03-10 MED ORDER — SODIUM CHLORIDE 0.9 % IV BOLUS (SEPSIS)
1000.0000 mL | Freq: Once | INTRAVENOUS | Status: AC
  Administered 2013-03-10: 1000 mL via INTRAVENOUS

## 2013-03-10 MED ORDER — KETOROLAC TROMETHAMINE 30 MG/ML IJ SOLN
30.0000 mg | Freq: Four times a day (QID) | INTRAMUSCULAR | Status: AC
  Administered 2013-03-10 – 2013-03-11 (×6): 30 mg via INTRAVENOUS
  Filled 2013-03-10 (×8): qty 1

## 2013-03-10 MED ORDER — HYDROMORPHONE HCL PF 1 MG/ML IJ SOLN
1.0000 mg | Freq: Once | INTRAMUSCULAR | Status: AC
  Administered 2013-03-10: 1 mg via INTRAVENOUS
  Filled 2013-03-10: qty 1

## 2013-03-10 MED ORDER — MORPHINE SULFATE 2 MG/ML IJ SOLN
2.0000 mg | INTRAMUSCULAR | Status: DC | PRN
  Administered 2013-03-10: 2 mg via INTRAVENOUS
  Administered 2013-03-10: 6 mg via INTRAVENOUS
  Administered 2013-03-10: 2 mg via INTRAVENOUS
  Administered 2013-03-10: 4 mg via INTRAVENOUS
  Administered 2013-03-10: 6 mg via INTRAVENOUS
  Administered 2013-03-11: 2 mg via INTRAVENOUS
  Administered 2013-03-11: 4 mg via INTRAVENOUS
  Administered 2013-03-11 (×3): 2 mg via INTRAVENOUS
  Administered 2013-03-11: 4 mg via INTRAVENOUS
  Administered 2013-03-12: 6 mg via INTRAVENOUS
  Administered 2013-03-12 – 2013-03-13 (×5): 4 mg via INTRAVENOUS
  Administered 2013-03-13: 6 mg via INTRAVENOUS
  Administered 2013-03-13: 4 mg via INTRAVENOUS
  Filled 2013-03-10: qty 3
  Filled 2013-03-10: qty 2
  Filled 2013-03-10: qty 3
  Filled 2013-03-10: qty 1
  Filled 2013-03-10: qty 2
  Filled 2013-03-10 (×2): qty 1
  Filled 2013-03-10: qty 2
  Filled 2013-03-10 (×2): qty 1
  Filled 2013-03-10 (×2): qty 2
  Filled 2013-03-10: qty 3
  Filled 2013-03-10: qty 2
  Filled 2013-03-10: qty 1
  Filled 2013-03-10 (×3): qty 2
  Filled 2013-03-10: qty 3

## 2013-03-10 MED ORDER — NAPHAZOLINE HCL 0.1 % OP SOLN
1.0000 [drp] | Freq: Four times a day (QID) | OPHTHALMIC | Status: DC | PRN
  Filled 2013-03-10: qty 15

## 2013-03-10 MED ORDER — MORPHINE SULFATE 4 MG/ML IJ SOLN
4.0000 mg | Freq: Once | INTRAMUSCULAR | Status: AC
  Administered 2013-03-10: 4 mg via INTRAVENOUS
  Filled 2013-03-10: qty 1

## 2013-03-10 MED ORDER — IOHEXOL 300 MG/ML  SOLN
50.0000 mL | Freq: Once | INTRAMUSCULAR | Status: AC | PRN
  Administered 2013-03-10: 50 mL via ORAL

## 2013-03-10 NOTE — H&P (Signed)
Helen Young is an 52 y.o. female.   Chief Complaint:   Crampy abdominal pain with nausea and vomiting HPI: She underwent laparoscopic converted to open ventral insertional coronary care and repair of bladder injury on 03/02/2013. She was discharged the Foley catheter and a drain in on 03/05/2013. She was doing well until early this morning between midnight and 12:30 were she awoke with crampy abdominal pain and large volume emesis. The emesis was clear. She did not have much of an appetite yesterday and was only drinking fluids. She did have a bowel movement yesterday at 9 AM. She had passed a little gas yesterday.  Past Medical History  Diagnosis Date  . HTN (hypertension)   . Environmental allergies 02-27-13    OTC meds daily  . Headache(784.0) 02-27-13    occ. migraines  . Transfusion history     childhood s/p Tonsillectomy    Past Surgical History  Procedure Laterality Date  . Hernia repair      x's 2   . Knee surgery      Left  . Total abdominal hysterectomy w/ bilateral salpingoophorectomy    . Neck surgery      Disc replaced with a steel plate  . Tonsillectomy      childhoood  . Abdominal hysterectomy    . Incisional hernia repair N/A 03/02/2013    Procedure: LAPAROSCOPIC INCISIONAL HERNIA CONVERTED TO OPEN ;  Surgeon: Shelly Rubenstein, MD;  Location: [facility];  Service: General;  Laterality: N/A;  . Cystostomy N/A 03/02/2013    Procedure: CYSTOSTOMY CLOSURE ;  Surgeon: Sebastian Ache, MD;  Location: [facility];  Service: Urology;  Laterality: N/A;    Family History  Problem Relation Age of Onset  . Colon cancer Father   . Prostate cancer Father   . Heart disease Mother   . Heart disease Father   . Stroke Mother   . Hypertension Mother   . Hypertension Father   . Kidney disease Maternal Grandfather   . Diabetes Maternal Grandfather   . Sudden death Paternal Grandfather   . Sudden death Paternal Uncle     Cardiac issues   Social History:  reports that she quit smoking  about 7 years ago. She has never used smokeless tobacco. She reports that  drinks alcohol. She reports that she does not use illicit drugs.  Allergies:  Allergies  Allergen Reactions  . Percocet (Oxycodone-Acetaminophen) Swelling     (Not in a hospital admission)  Results for orders placed during the hospital encounter of 03/10/13 (from the past 48 hour(s))  CBC WITH DIFFERENTIAL     Status: Abnormal   Collection Time    03/10/13  6:50 AM      Result Value Range   WBC 10.1  4.0 - 10.5 K/uL   RBC 4.54  3.87 - 5.11 MIL/uL   Hemoglobin 13.2  12.0 - 15.0 g/dL   HCT 16.1  09.6 - 04.5 %   MCV 88.1  78.0 - 100.0 fL   MCH 29.1  26.0 - 34.0 pg   MCHC 33.0  30.0 - 36.0 g/dL   RDW 40.9  81.1 - 91.4 %   Platelets 467 (*) 150 - 400 K/uL   Neutrophils Relative % 79 (*) 43 - 77 %   Neutro Abs 8.0 (*) 1.7 - 7.7 K/uL   Lymphocytes Relative 15  12 - 46 %   Lymphs Abs 1.5  0.7 - 4.0 K/uL   Monocytes Relative 5  3 - 12 %  Monocytes Absolute 0.5  0.1 - 1.0 K/uL   Eosinophils Relative 1  0 - 5 %   Eosinophils Absolute 0.1  0.0 - 0.7 K/uL   Basophils Relative 1  0 - 1 %   Basophils Absolute 0.1  0.0 - 0.1 K/uL  BASIC METABOLIC PANEL     Status: Abnormal   Collection Time    03/10/13  6:50 AM      Result Value Range   Sodium 137  135 - 145 mEq/L   Potassium 4.1  3.5 - 5.1 mEq/L   Chloride 100  96 - 112 mEq/L   CO2 28  19 - 32 mEq/L   Glucose, Bld 114 (*) 70 - 99 mg/dL   BUN 13  6 - 23 mg/dL   Creatinine, Ser 1.61  0.50 - 1.10 mg/dL   Calcium 9.6  8.4 - 09.6 mg/dL   GFR calc non Af Amer >90  >90 mL/min   GFR calc Af Amer >90  >90 mL/min   Comment:            The eGFR has been calculated     using the CKD EPI equation.     This calculation has not been     validated in all clinical     situations.     eGFR's persistently     <90 mL/min signify     possible Chronic Kidney Disease.  LIPASE, BLOOD     Status: None   Collection Time    03/10/13  6:50 AM      Result Value Range    Lipase 15  11 - 59 U/L  HEPATIC FUNCTION PANEL     Status: None   Collection Time    03/10/13  6:50 AM      Result Value Range   Total Protein 8.0  6.0 - 8.3 g/dL   Albumin 3.5  3.5 - 5.2 g/dL   AST 15  0 - 37 U/L   ALT 11  0 - 35 U/L   Alkaline Phosphatase 51  39 - 117 U/L   Total Bilirubin 0.4  0.3 - 1.2 mg/dL   Bilirubin, Direct <0.4  0.0 - 0.3 mg/dL   Comment: REPEATED TO VERIFY   Indirect Bilirubin NOT CALCULATED  0.3 - 0.9 mg/dL  URINALYSIS, ROUTINE W REFLEX MICROSCOPIC     Status: Abnormal   Collection Time    03/10/13  8:31 AM      Result Value Range   Color, Urine YELLOW  YELLOW   APPearance CLEAR  CLEAR   Specific Gravity, Urine 1.014  1.005 - 1.030   pH 7.0  5.0 - 8.0   Glucose, UA NEGATIVE  NEGATIVE mg/dL   Hgb urine dipstick LARGE (*) NEGATIVE   Bilirubin Urine NEGATIVE  NEGATIVE   Ketones, ur NEGATIVE  NEGATIVE mg/dL   Protein, ur NEGATIVE  NEGATIVE mg/dL   Urobilinogen, UA 0.2  0.0 - 1.0 mg/dL   Nitrite NEGATIVE  NEGATIVE   Leukocytes, UA NEGATIVE  NEGATIVE  URINE MICROSCOPIC-ADD ON     Status: None   Collection Time    03/10/13  8:31 AM      Result Value Range   Squamous Epithelial / LPF RARE  RARE   WBC, UA 0-2  <3 WBC/hpf   RBC / HPF 11-20  <3 RBC/hpf   Ct Abdomen Pelvis W Contrast  03/10/2013   *RADIOLOGY REPORT*  Clinical Data: Abdominal pain and emesis  CT ABDOMEN AND PELVIS WITH CONTRAST  Technique:  Multidetector CT imaging of the abdomen and pelvis was performed following the standard protocol during bolus administration of intravenous contrast.  Contrast: 50mL OMNIPAQUE IOHEXOL 300 MG/ML  SOLN, OMNIPAQUE IOHEXOL 300 MG/ML  SOLN  Comparison: 03/10/2013  Findings: Calcified granuloma is noted within the lingular portion of the left lung.  There are several small nodules identified in the right lower lobe which measure up to the 3 mm, image two/series 4.  No focal liver abnormality noted.  Gallbladder appears normal. There is no biliary dilatation.   Normal appearance of the pancreas. The spleen is normal.  The adrenal glands are both unremarkable.  Normal appearance of the kidneys.  The urinary bladder is collapsed around a Foley catheter balloon.  Prior hysterectomy.  Normal caliber of the abdominal aorta.  There is no aneurysm. There is no pelvic or inguinal adenopathy noted.  The stomach is normal.  The proximal small bowel loops appear normal. The pelvic small bowel loops are diffusely abnormal with marked edema of the bowel wall.  A single wall thickness measures up to 6.4 mm.  There is abnormal mucosal enhancement involving the thickening small bowel loops.  The proximal colon is unremarkable. There is abnormal bowel wall edema involving the left colon.  A drainage catheter and is situated within the pelvis which enters from a ventral right lower quadrant approach.  No pelvic fluid collections or free fluid identified.  There is ascites around the liver and spleen.  There is infiltration of the subcutaneous fat within the lower ventral abdominal wall.  Asymmetric enhancement and thickening of the left left lower rectus muscle noted.  These are likely postoperative findings.  Review of the visualized osseous structures is significant for mild multilevel spondylosis and scoliosis.  IMPRESSION:  1.  Examination is remarkable for abnormal wall edema and mucosal enhancement of the pelvic small bowel loops. This is consistent with enteritis and may be inflammatory or infectious in etiology. There is no evidence for bowel perforation, pneumatosis or abscess formation. 2.  Mild edema involves the left colon. This is also likely secondary to inflammation or infection. 3.  Ascites is identified within the upper abdomen overlying the liver and spleen. 4.  Pelvic surgical drain is in place without evidence for abscess. 5.  Postoperative changes involve the ventral abdominal wall. 6.  Pulmonary nodules are identified within the right lower lobe. If the patient is at  high risk for bronchogenic carcinoma, follow- up chest CT at 1 year is recommended.  If the patient is at low risk, no follow-up is needed.  This recommendation follows the consensus statement: Guidelines for Management of Small Pulmonary Nodules Detected on CT Scans:  A Statement from the Fleischner Society as published in Radiology 2005; 237:395-400.   Original Report Authenticated By: Signa Kell, M.D.   Dg Abd Acute W/chest  03/10/2013   *RADIOLOGY REPORT*  Clinical Data: Abdominal pain and emesis.  ACUTE ABDOMEN SERIES (ABDOMEN 2 VIEW & CHEST 1 VIEW)  Comparison: Chest x-ray dated 02/27/2013  Findings: Heart size and vascularity are normal.  The lungs are clear except for a stable calcified granuloma at the left lung base.  Moderate thoracolumbar scoliosis.  No free air or free fluid in the abdomen.  There is a drain overlying the pelvis.  There is only a minimal amount of air in the nondistended bowel.  IMPRESSION: No acute abnormalities.   Original Report Authenticated By: Francene Boyers, M.D.    Review of Systems  Constitutional: Positive for  chills. Negative for fever.  Respiratory: Negative for cough.   Gastrointestinal: Positive for nausea, vomiting and abdominal pain. Negative for diarrhea and blood in stool.    Blood pressure 165/91, pulse 89, temperature 98.4 F (36.9 C), temperature source Oral, resp. rate 11, SpO2 100.00%. Physical Exam  Constitutional: She appears well-developed and well-nourished. No distress.  HENT:  Head: Normocephalic and atraumatic.  Eyes: No scleral icterus.  Neck: Neck supple.  Cardiovascular: Normal rate and regular rhythm.   Respiratory: Effort normal and breath sounds normal.  GI: Soft. She exhibits no distension.  Some induration superior to incision with mild tenderness. Active bowel sounds are noted. Drain with serosanguineous drainage.  Genitourinary:  Foley catheter in. Urine is slightly blood tinged.  Musculoskeletal: She exhibits no edema.   Lymphadenopathy:    She has no cervical adenopathy.     Assessment/Plan Postoperative abdominal pain with nausea and vomiting. Some inflammatory changes are noted in the small intestine and left colon area were adhesiolysis was performed.  No definite evidence of postoperative infection.  Plan: Admit to the hospital. Clear liquids. IV fluid hydration. Observation. If she starts showing signs of infection we'll start antibiotics.  Diangelo Radel J 03/10/2013, 9:53 AM

## 2013-03-10 NOTE — ED Provider Notes (Signed)
Medical screening examination/treatment/procedure(s) were conducted as a shared visit with non-physician practitioner(s) and myself.  I personally evaluated the patient during the encounter  Doug Sou, MD 03/10/13 [phone]

## 2013-03-10 NOTE — ED Notes (Signed)
Pt presents with abdominal pain and vomiting that started around midnight. Pt had surgery last Friday to repair a hernia and to correct a hole that was found in her bladder. Pt has had no episodes of abdominal pain or vomiting since this began last night. Pt currently has a catheter draining to a leg bag. Pt also has a JP drain on her right side from the surgery.

## 2013-03-10 NOTE — ED Notes (Signed)
Patient has intact foley draining clear yellow urine. JPdrain to right lower abdomen intact-<68ml dark red blood.

## 2013-03-10 NOTE — ED Provider Notes (Signed)
History    CSN: [id number] Arrival date & time 03/10/13  4098  First MD Initiated Contact with Patient 03/10/13 0645     Chief Complaint  Patient presents with  . Abdominal Pain  . Emesis   (Consider location/radiation/quality/duration/timing/severity/associated sxs/prior Treatment) HPI Comments: Pt with recent laparoscopic converted to open incisional hernia repair and intraoperative incidental cystotomy repair on 6/27, presents with sudden increase in abdominal pain with N/V.  Reports pain woke her from sleep last night, is located in periumbilical area where laparoscopic incision was placed.  Pain is sharp in nature, waxing and waning, causing diffuse abdominal cramps, 10/10 intensity.  Has had two episodes of vomiting, emesis is clear.  Is having associated chills and sweats related to pain.  Last BM was 9pm last night and was normal.  Pt is passing flatus normally.  Pt has foley catheter in place, states output was bloody yesterday but is now back to normal.  Denies fevers.  General surgery: Dr Magnus Ivan (f/u scheduled 03/12/13) Urology: Dr Berneice Heinrich (f/u scheduled 03/13/13)    Patient is a 52 y.o. female presenting with abdominal pain and vomiting. The history is provided by the patient.  Abdominal Pain Associated symptoms include abdominal pain, chills, nausea and vomiting. Pertinent negatives include no chest pain, coughing or fever.  Emesis Associated symptoms: abdominal pain and chills   Associated symptoms: no diarrhea    Past Medical History  Diagnosis Date  . HTN (hypertension)   . Environmental allergies 02-27-13    OTC meds daily  . Headache(784.0) 02-27-13    occ. migraines  . Transfusion history     childhood s/p Tonsillectomy   Past Surgical History  Procedure Laterality Date  . Hernia repair      x's 2   . Knee surgery      Left  . Total abdominal hysterectomy w/ bilateral salpingoophorectomy    . Neck surgery      Disc replaced with a steel plate  . Tonsillectomy       childhoood  . Abdominal hysterectomy    . Incisional hernia repair N/A 03/02/2013    Procedure: LAPAROSCOPIC INCISIONAL HERNIA CONVERTED TO OPEN ;  Surgeon: Shelly Rubenstein, MD;  Location: [facility];  Service: General;  Laterality: N/A;  . Cystostomy N/A 03/02/2013    Procedure: CYSTOSTOMY CLOSURE ;  Surgeon: Sebastian Ache, MD;  Location: [facility];  Service: Urology;  Laterality: N/A;   Family History  Problem Relation Age of Onset  . Colon cancer Father   . Prostate cancer Father   . Heart disease Mother   . Heart disease Father   . Stroke Mother   . Hypertension Mother   . Hypertension Father   . Kidney disease Maternal Grandfather   . Diabetes Maternal Grandfather   . Sudden death Paternal Grandfather   . Sudden death Paternal Uncle     Cardiac issues   History  Substance Use Topics  . Smoking status: Former Smoker    Quit date: 09/06/2005  . Smokeless tobacco: Never Used  . Alcohol Use: Yes     Comment: Rarely   OB History   Grav Para Term Preterm Abortions TAB SAB Ect Mult Living                 Review of Systems  Constitutional: Positive for chills. Negative for fever.  Respiratory: Negative for cough and shortness of breath.   Cardiovascular: Negative for chest pain.  Gastrointestinal: Positive for nausea, vomiting and abdominal pain. Negative  for diarrhea, constipation and blood in stool.  Genitourinary: Positive for hematuria.  Skin: Positive for wound.    Allergies  Percocet  Home Medications   Current Outpatient Rx  Name  Route  Sig  Dispense  Refill  . amLODipine (NORVASC) 5 MG tablet   Oral   Take 5 mg by mouth every evening.         . cetirizine (ZYRTEC) 10 MG tablet   Oral   Take 10 mg by mouth at bedtime.          Marland Kitchen estrogens, conjugated, (PREMARIN) 0.625 MG tablet   Oral   Take 0.625 mg by mouth every morning.         Marland Kitchen HYDROcodone-acetaminophen (NORCO/VICODIN) 5-325 MG per tablet   Oral   Take 1-2 tablets by mouth every 4  (four) hours as needed for pain.   30 tablet   1   . meloxicam (MOBIC) 7.5 MG tablet   Oral   Take 7.5 mg by mouth 2 (two) times daily as needed for pain.         . mometasone (NASONEX) 50 MCG/ACT nasal spray   Nasal   Place 2 sprays into the nose every morning.         Marland Kitchen tetrahydrozoline 0.05 % ophthalmic solution   Both Eyes   Place 1 drop into both eyes daily as needed (for dry eyes).         . valsartan-hydrochlorothiazide (DIOVAN-HCT) 320-25 MG per tablet   Oral   Take 1 tablet by mouth every evening.          BP 151/87  Pulse 100  Temp(Src) 98.4 F (36.9 C) (Oral)  Resp 18  SpO2 98% Physical Exam  Nursing note and vitals reviewed. Constitutional: She appears well-developed and well-nourished. No distress.  HENT:  Head: Normocephalic and atraumatic.  Neck: Neck supple.  Cardiovascular: Normal rate and regular rhythm.   Pulmonary/Chest: Effort normal and breath sounds normal. No respiratory distress. She has no wheezes. She has no rales.  Abdominal: Soft. Bowel sounds are normal. She exhibits no distension. There is generalized tenderness. There is no rebound and no guarding.    Neurological: She is alert.  Skin: She is not diaphoretic.    ED Course  Procedures (including critical care time) Labs Reviewed  CBC WITH DIFFERENTIAL - Abnormal; Notable for the following:    Platelets 467 (*)    Neutrophils Relative % 79 (*)    Neutro Abs 8.0 (*)    All other components within normal limits  BASIC METABOLIC PANEL - Abnormal; Notable for the following:    Glucose, Bld 114 (*)    All other components within normal limits  URINALYSIS, ROUTINE W REFLEX MICROSCOPIC - Abnormal; Notable for the following:    Hgb urine dipstick LARGE (*)    All other components within normal limits  URINE CULTURE  LIPASE, BLOOD  HEPATIC FUNCTION PANEL  URINE MICROSCOPIC-ADD ON   Ct Abdomen Pelvis W Contrast  03/10/2013   *RADIOLOGY REPORT*  Clinical Data: Abdominal pain and  emesis  CT ABDOMEN AND PELVIS WITH CONTRAST  Technique:  Multidetector CT imaging of the abdomen and pelvis was performed following the standard protocol during bolus administration of intravenous contrast.  Contrast: 50mL OMNIPAQUE IOHEXOL 300 MG/ML  SOLN, OMNIPAQUE IOHEXOL 300 MG/ML  SOLN  Comparison: 03/10/2013  Findings: Calcified granuloma is noted within the lingular portion of the left lung.  There are several small nodules identified in the right  lower lobe which measure up to the 3 mm, image two/series 4.  No focal liver abnormality noted.  Gallbladder appears normal. There is no biliary dilatation.  Normal appearance of the pancreas. The spleen is normal.  The adrenal glands are both unremarkable.  Normal appearance of the kidneys.  The urinary bladder is collapsed around a Foley catheter balloon.  Prior hysterectomy.  Normal caliber of the abdominal aorta.  There is no aneurysm. There is no pelvic or inguinal adenopathy noted.  The stomach is normal.  The proximal small bowel loops appear normal. The pelvic small bowel loops are diffusely abnormal with marked edema of the bowel wall.  A single wall thickness measures up to 6.4 mm.  There is abnormal mucosal enhancement involving the thickening small bowel loops.  The proximal colon is unremarkable. There is abnormal bowel wall edema involving the left colon.  A drainage catheter and is situated within the pelvis which enters from a ventral right lower quadrant approach.  No pelvic fluid collections or free fluid identified.  There is ascites around the liver and spleen.  There is infiltration of the subcutaneous fat within the lower ventral abdominal wall.  Asymmetric enhancement and thickening of the left left lower rectus muscle noted.  These are likely postoperative findings.  Review of the visualized osseous structures is significant for mild multilevel spondylosis and scoliosis.  IMPRESSION:  1.  Examination is remarkable for abnormal wall  edema and mucosal enhancement of the pelvic small bowel loops. This is consistent with enteritis and may be inflammatory or infectious in etiology. There is no evidence for bowel perforation, pneumatosis or abscess formation. 2.  Mild edema involves the left colon. This is also likely secondary to inflammation or infection. 3.  Ascites is identified within the upper abdomen overlying the liver and spleen. 4.  Pelvic surgical drain is in place without evidence for abscess. 5.  Postoperative changes involve the ventral abdominal wall. 6.  Pulmonary nodules are identified within the right lower lobe. If the patient is at high risk for bronchogenic carcinoma, follow- up chest CT at 1 year is recommended.  If the patient is at low risk, no follow-up is needed.  This recommendation follows the consensus statement: Guidelines for Management of Small Pulmonary Nodules Detected on CT Scans:  A Statement from the Fleischner Society as published in Radiology 2005; 237:395-400.   Original Report Authenticated By: Signa Kell, M.D.   Dg Abd Acute W/chest  03/10/2013   *RADIOLOGY REPORT*  Clinical Data: Abdominal pain and emesis.  ACUTE ABDOMEN SERIES (ABDOMEN 2 VIEW & CHEST 1 VIEW)  Comparison: Chest x-ray dated 02/27/2013  Findings: Heart size and vascularity are normal.  The lungs are clear except for a stable calcified granuloma at the left lung base.  Moderate thoracolumbar scoliosis.  No free air or free fluid in the abdomen.  There is a drain overlying the pelvis.  There is only a minimal amount of air in the nondistended bowel.  IMPRESSION: No acute abnormalities.   Original Report Authenticated By: Francene Boyers, M.D.    Dr Ethelda Chick made aware of the patient.  Dr Michaell Cowing made aware of the patient and will alert oncoming surgeon, whom we may call if needed pending CT abd/pelvis.   7:50 AM Pt reports pain and nausea well controlled with medications currently.   9:19 AM I spoke with Dr Abbey Chatters who will come to  see the patient.   1. Postoperative pain   2. Abdominal pain   3.  Enteritis   4. Nausea and vomiting     MDM  Pt with recent abdominal surgery (lap converted to open incisional hernia repair with intraoperative incidental cystotomy repaired by urology) p/w abdominal pain, N/V. Pt is having normal BM, passing flatus. Admitted by Surgery.   Trixie Dredge, PA-C 03/10/13 1527

## 2013-03-10 NOTE — ED Notes (Signed)
Drainage bag attached to catheter to replace leg bag at this time.

## 2013-03-10 NOTE — Plan of Care (Signed)
Problem: Phase I Progression Outcomes Goal: Voiding-avoid urinary catheter unless indicated Outcome: Not Met (add Reason) Foley catheter        

## 2013-03-10 NOTE — ED Provider Notes (Signed)
Complains of abdominal pain, periumbilical, nonradiating awaken her from sleep about midnight today. Patient felt well when she went to bed last night. She did not eat dinner because she fell asleep. She vomit twice clear material with "a brown tinge to it", she last required Vicodin for postoperative pain 2 days ago. She is status post hernia repair 03/02/2013. On exam alert nontoxic abdomen obese no marked bowel sounds tender at periumbilical area with firm tender area and infraumbilical area checked in combat drain in place at right lower quadrant, draining bloodv fluid  Doug Sou, MD 03/10/13 1617

## 2013-03-11 LAB — CBC
HCT: 37.3 % (ref 36.0–46.0)
MCV: 89 fL (ref 78.0–100.0)
Platelets: 487 10*3/uL — ABNORMAL HIGH (ref 150–400)
RBC: 4.19 MIL/uL (ref 3.87–5.11)
WBC: 7.4 10*3/uL (ref 4.0–10.5)

## 2013-03-11 LAB — BASIC METABOLIC PANEL
CO2: 30 mEq/L (ref 19–32)
Chloride: 101 mEq/L (ref 96–112)
Sodium: 137 mEq/L (ref 135–145)

## 2013-03-11 NOTE — Progress Notes (Signed)
Patient Blood pressure rechecked 132/87.  No complaints of pain, no nausea.

## 2013-03-11 NOTE — Progress Notes (Signed)
Subjective: Still having intermittent waves of sharp and crampy pain that begins just above umbilicus and radiates down to the pelvis.  Relieved with vomiting-low output.  No flatus or BM.  Objective: Vital signs in last 24 hours: Temp:  [97.8 F (36.6 C)-98.7 F (37.1 C)] 97.9 F (36.6 C) (07/06 0500) Pulse Rate:  [70-101] 83 (07/06 0657) Resp:  [11-18] 18 (07/06 0500) BP: (121-169)/(77-116) 125/86 mmHg (07/06 0657) SpO2:  [96 %-100 %] 98 % (07/06 0500) Weight:  [195 lb 7.7 oz (88.67 kg)] 195 lb 7.7 oz (88.67 kg) (07/05 1406) Last BM Date: 03/09/13  Intake/Output from previous day: 07/05 0701 - 07/06 0700 In: 2202.1 [I.V.:2202.1] Out: 1311 [Urine:1310; Emesis/NG output:1] Intake/Output this shift:    PE: General- In NAD Abdomen-soft, nontender, incision clean and intact, no bowel sounds. Minimal drain output GU-foley in  Lab Results:   Recent Labs  03/10/13 0650 03/11/13 0410  WBC 10.1 7.4  HGB 13.2 11.9*  HCT 40.0 37.3  PLT 467* 487*   BMET  Recent Labs  03/10/13 0650 03/11/13 0410  NA 137 137  K 4.1 3.9  CL 100 101  CO2 28 30  GLUCOSE 114* 122*  BUN 13 7  CREATININE 0.65 0.68  CALCIUM 9.6 9.1   PT/INR No results found for this basename: LABPROT, INR,  in the last 72 hours Comprehensive Metabolic Panel:    Component Value Date/Time   NA 137 03/11/2013 0410   K 3.9 03/11/2013 0410   CL 101 03/11/2013 0410   CO2 30 03/11/2013 0410   BUN 7 03/11/2013 0410   CREATININE 0.68 03/11/2013 0410   GLUCOSE 122* 03/11/2013 0410   CALCIUM 9.1 03/11/2013 0410   AST 15 03/10/2013 0650   ALT 11 03/10/2013 0650   ALKPHOS 51 03/10/2013 0650   BILITOT 0.4 03/10/2013 0650   PROT 8.0 03/10/2013 0650   ALBUMIN 3.5 03/10/2013 0650     Studies/Results: Ct Abdomen Pelvis W Contrast  03/10/2013   *RADIOLOGY REPORT*  Clinical Data: Abdominal pain and emesis  CT ABDOMEN AND PELVIS WITH CONTRAST  Technique:  Multidetector CT imaging of the abdomen and pelvis was performed following the  standard protocol during bolus administration of intravenous contrast.  Contrast: 50mL OMNIPAQUE IOHEXOL 300 MG/ML  SOLN, OMNIPAQUE IOHEXOL 300 MG/ML  SOLN  Comparison: 03/10/2013  Findings: Calcified granuloma is noted within the lingular portion of the left lung.  There are several small nodules identified in the right lower lobe which measure up to the 3 mm, image two/series 4.  No focal liver abnormality noted.  Gallbladder appears normal. There is no biliary dilatation.  Normal appearance of the pancreas. The spleen is normal.  The adrenal glands are both unremarkable.  Normal appearance of the kidneys.  The urinary bladder is collapsed around a Foley catheter balloon.  Prior hysterectomy.  Normal caliber of the abdominal aorta.  There is no aneurysm. There is no pelvic or inguinal adenopathy noted.  The stomach is normal.  The proximal small bowel loops appear normal. The pelvic small bowel loops are diffusely abnormal with marked edema of the bowel wall.  A single wall thickness measures up to 6.4 mm.  There is abnormal mucosal enhancement involving the thickening small bowel loops.  The proximal colon is unremarkable. There is abnormal bowel wall edema involving the left colon.  A drainage catheter and is situated within the pelvis which enters from a ventral right lower quadrant approach.  No pelvic fluid collections or free fluid identified.  There is ascites around the liver and spleen.  There is infiltration of the subcutaneous fat within the lower ventral abdominal wall.  Asymmetric enhancement and thickening of the left left lower rectus muscle noted.  These are likely postoperative findings.  Review of the visualized osseous structures is significant for mild multilevel spondylosis and scoliosis.  IMPRESSION:  1.  Examination is remarkable for abnormal wall edema and mucosal enhancement of the pelvic small bowel loops. This is consistent with enteritis and may be inflammatory or infectious in  etiology. There is no evidence for bowel perforation, pneumatosis or abscess formation. 2.  Mild edema involves the left colon. This is also likely secondary to inflammation or infection. 3.  Ascites is identified within the upper abdomen overlying the liver and spleen. 4.  Pelvic surgical drain is in place without evidence for abscess. 5.  Postoperative changes involve the ventral abdominal wall. 6.  Pulmonary nodules are identified within the right lower lobe. If the patient is at high risk for bronchogenic carcinoma, follow- up chest CT at 1 year is recommended.  If the patient is at low risk, no follow-up is needed.  This recommendation follows the consensus statement: Guidelines for Management of Small Pulmonary Nodules Detected on CT Scans:  A Statement from the Fleischner Society as published in Radiology 2005; 237:395-400.   Original Report Authenticated By: Signa Kell, M.D.   Dg Abd Acute W/chest  03/10/2013   *RADIOLOGY REPORT*  Clinical Data: Abdominal pain and emesis.  ACUTE ABDOMEN SERIES (ABDOMEN 2 VIEW & CHEST 1 VIEW)  Comparison: Chest x-ray dated 02/27/2013  Findings: Heart size and vascularity are normal.  The lungs are clear except for a stable calcified granuloma at the left lung base.  Moderate thoracolumbar scoliosis.  No free air or free fluid in the abdomen.  There is a drain overlying the pelvis.  There is only a minimal amount of air in the nondistended bowel.  IMPRESSION: No acute abnormalities.   Original Report Authenticated By: Francene Boyers, M.D.    Anti-infectives: Anti-infectives   None      Assessment Principal Problem:  Segmental small bowel edema and ileus-afebrile and WBC normal; no clinical evidence of compromised bowel.    LOS: 1 day   Plan: Continue clear liquids and IVF hydration.   Chirstina Haan J 03/11/2013

## 2013-03-11 NOTE — Progress Notes (Signed)
Called to patient room for pain and nausea medication.  Patient states pain at 7 with cramping pain. 2 mg morphine and 4 mg Zofran given iv at 0342.  While in room patient pain continued to increase until patient was shaking, sweating, and dry heaving,  Patient states pain feels sharp and like her intestines are twisting.  An additional 2 mg morphine given. After receiving 2nd dose of morphine patient began to have pain relief and nausea decreased.  Pt bowel sounds are faint.  Cool wash cloth placed on patient forehead.  Patient pain level decreased to more comfortable level at 0415.

## 2013-03-11 NOTE — Progress Notes (Signed)
Pt with episode of emesis, blood tinged.  Unable to measure due to emesis in trash can.  Patient helped back to bed. Vital signs obtained, bp elevated will continue to monitor.

## 2013-03-12 ENCOUNTER — Inpatient Hospital Stay (HOSPITAL_COMMUNITY): Payer: BC Managed Care – PPO

## 2013-03-12 ENCOUNTER — Encounter (INDEPENDENT_AMBULATORY_CARE_PROVIDER_SITE_OTHER): Payer: BC Managed Care – PPO | Admitting: Surgery

## 2013-03-12 LAB — CBC
HCT: 34.4 % — ABNORMAL LOW (ref 36.0–46.0)
Hemoglobin: 11 g/dL — ABNORMAL LOW (ref 12.0–15.0)
MCH: 28.1 pg (ref 26.0–34.0)
MCHC: 32 g/dL (ref 30.0–36.0)
RBC: 3.91 MIL/uL (ref 3.87–5.11)

## 2013-03-12 LAB — URINE CULTURE: Special Requests: NORMAL

## 2013-03-12 MED ORDER — DIATRIZOATE MEGLUMINE 30 % UR SOLN
Freq: Once | URETHRAL | Status: AC | PRN
  Administered 2013-03-12: 125 mL

## 2013-03-12 MED ORDER — GI COCKTAIL ~~LOC~~
30.0000 mL | Freq: Three times a day (TID) | ORAL | Status: DC | PRN
  Administered 2013-03-12 (×2): 30 mL via ORAL
  Filled 2013-03-12 (×2): qty 30

## 2013-03-12 NOTE — Progress Notes (Signed)
Subjective: Still with cramping abdominal pain as well as epigastric burning pain. Having BM's  Objective: Vital signs in last 24 hours: Temp:  [97.9 F (36.6 C)-98.5 F (36.9 C)] 98.2 F (36.8 C) (07/07 0445) Pulse Rate:  [78-98] 82 (07/07 0445) Resp:  [16-18] 18 (07/07 0445) BP: (132-166)/(80-105) 137/80 mmHg (07/07 0445) SpO2:  [94 %-100 %] 100 % (07/07 0445) Last BM Date: 03/11/13  Intake/Output from previous day: 07/06 0701 - 07/07 0700 In: 3200 [P.O.:200; I.V.:3000] Out: 1365 [Urine:1350; Drains:15] Intake/Output this shift:    Abdomen soft, non distended, minimally tender Drain serosang Foley in place  Lab Results:   Recent Labs  03/11/13 0410 03/12/13 0359  WBC 7.4 7.8  HGB 11.9* 11.0*  HCT 37.3 34.4*  PLT 487* 438*   BMET  Recent Labs  03/10/13 0650 03/11/13 0410  NA 137 137  K 4.1 3.9  CL 100 101  CO2 28 30  GLUCOSE 114* 122*  BUN 13 7  CREATININE 0.65 0.68  CALCIUM 9.6 9.1   PT/INR No results found for this basename: LABPROT, INR,  in the last 72 hours ABG No results found for this basename: PHART, PCO2, PO2, HCO3,  in the last 72 hours  Studies/Results: Ct Abdomen Pelvis W Contrast  03/10/2013   *RADIOLOGY REPORT*  Clinical Data: Abdominal pain and emesis  CT ABDOMEN AND PELVIS WITH CONTRAST  Technique:  Multidetector CT imaging of the abdomen and pelvis was performed following the standard protocol during bolus administration of intravenous contrast.  Contrast: 50mL OMNIPAQUE IOHEXOL 300 MG/ML  SOLN, OMNIPAQUE IOHEXOL 300 MG/ML  SOLN  Comparison: 03/10/2013  Findings: Calcified granuloma is noted within the lingular portion of the left lung.  There are several small nodules identified in the right lower lobe which measure up to the 3 mm, image two/series 4.  No focal liver abnormality noted.  Gallbladder appears normal. There is no biliary dilatation.  Normal appearance of the pancreas. The spleen is normal.  The adrenal glands are  both unremarkable.  Normal appearance of the kidneys.  The urinary bladder is collapsed around a Foley catheter balloon.  Prior hysterectomy.  Normal caliber of the abdominal aorta.  There is no aneurysm. There is no pelvic or inguinal adenopathy noted.  The stomach is normal.  The proximal small bowel loops appear normal. The pelvic small bowel loops are diffusely abnormal with marked edema of the bowel wall.  A single wall thickness measures up to 6.4 mm.  There is abnormal mucosal enhancement involving the thickening small bowel loops.  The proximal colon is unremarkable. There is abnormal bowel wall edema involving the left colon.  A drainage catheter and is situated within the pelvis which enters from a ventral right lower quadrant approach.  No pelvic fluid collections or free fluid identified.  There is ascites around the liver and spleen.  There is infiltration of the subcutaneous fat within the lower ventral abdominal wall.  Asymmetric enhancement and thickening of the left left lower rectus muscle noted.  These are likely postoperative findings.  Review of the visualized osseous structures is significant for mild multilevel spondylosis and scoliosis.  IMPRESSION:  1.  Examination is remarkable for abnormal wall edema and mucosal enhancement of the pelvic small bowel loops. This is consistent with enteritis and may be inflammatory or infectious in etiology. There is no evidence for bowel perforation, pneumatosis or abscess formation. 2.  Mild edema involves the left colon. This is also likely secondary to inflammation or infection.  3.  Ascites is identified within the upper abdomen overlying the liver and spleen. 4.  Pelvic surgical drain is in place without evidence for abscess. 5.  Postoperative changes involve the ventral abdominal wall. 6.  Pulmonary nodules are identified within the right lower lobe. If the patient is at high risk for bronchogenic carcinoma, follow- up chest CT at 1 year is  recommended.  If the patient is at low risk, no follow-up is needed.  This recommendation follows the consensus statement: Guidelines for Management of Small Pulmonary Nodules Detected on CT Scans:  A Statement from the Fleischner Society as published in Radiology 2005; 237:395-400.   Original Report Authenticated By: Signa Kell, M.D.    Anti-infectives: Anti-infectives   None      Assessment/Plan: s/p * No surgery found *  Suspect ileus from small bowel stuck in pelvis that was dissected free. May also have stress induced gastritis Will add protonix and maalox Will notify urology of admission and see if foley can come out  LOS: 2 days    Sheran Newstrom A 03/12/2013

## 2013-03-12 NOTE — Consult Note (Signed)
Reason for Consult: Catheter Management  Referring Physician: Magnus Ivan MD  Helen Young is an 52 y.o. female.   HPI:   1 - Small Cystotomy - pt s/p primary repair of small dome cystotomy 03/02/2013. Discharged with JP and foley with plan to f/u on [facility] office for cystogram 7/8 (tomorrow), but now readmitted for clinical ileus.  Primary team seeks input on catheter management as she will not make her f/u visit tomorrow.  No interval fevers or catheter problems. She reports much improvement in terms of her GI symptoms following GI cocktail.    Past Medical History  Diagnosis Date  . HTN (hypertension)   . Environmental allergies 02-27-13    OTC meds daily  . Headache(784.0) 02-27-13    occ. migraines  . Transfusion history     childhood s/p Tonsillectomy    Past Surgical History  Procedure Laterality Date  . Hernia repair      x's 2   . Knee surgery      Left  . Total abdominal hysterectomy w/ bilateral salpingoophorectomy    . Neck surgery      Disc replaced with a steel plate  . Tonsillectomy      childhoood  . Abdominal hysterectomy    . Incisional hernia repair N/A 03/02/2013    Procedure: LAPAROSCOPIC INCISIONAL HERNIA CONVERTED TO OPEN ;  Surgeon: Shelly Rubenstein, MD;  Location: [facility];  Service: General;  Laterality: N/A;  . Cystostomy N/A 03/02/2013    Procedure: CYSTOSTOMY CLOSURE ;  Surgeon: Sebastian Ache, MD;  Location: [facility];  Service: Urology;  Laterality: N/A;    Family History  Problem Relation Age of Onset  . Colon cancer Father   . Prostate cancer Father   . Heart disease Mother   . Heart disease Father   . Stroke Mother   . Hypertension Mother   . Hypertension Father   . Kidney disease Maternal Grandfather   . Diabetes Maternal Grandfather   . Sudden death Paternal Grandfather   . Sudden death Paternal Uncle     Cardiac issues    Social History:  reports that she quit smoking about 7 years ago. She has never used smokeless tobacco.  She reports that  drinks alcohol. She reports that she does not use illicit drugs.  Allergies:  Allergies  Allergen Reactions  . Percocet (Oxycodone-Acetaminophen) Swelling    Medications: I have reviewed the patient's current medications.  Results for orders placed during the hospital encounter of 03/10/13 (from the past 48 hour(s))  BASIC METABOLIC PANEL     Status: Abnormal   Collection Time    03/11/13  4:10 AM      Result Value Range   Sodium 137  135 - 145 mEq/L   Potassium 3.9  3.5 - 5.1 mEq/L   Chloride 101  96 - 112 mEq/L   CO2 30  19 - 32 mEq/L   Glucose, Bld 122 (*) 70 - 99 mg/dL   BUN 7  6 - 23 mg/dL   Creatinine, Ser 1.61  0.50 - 1.10 mg/dL   Calcium 9.1  8.4 - 09.6 mg/dL   GFR calc non Af Amer >90  >90 mL/min   GFR calc Af Amer >90  >90 mL/min   Comment:            The eGFR has been calculated     using the CKD EPI equation.     This calculation has not been     validated in  all clinical     situations.     eGFR's persistently     <90 mL/min signify     possible Chronic Kidney Disease.  CBC     Status: Abnormal   Collection Time    03/11/13  4:10 AM      Result Value Range   WBC 7.4  4.0 - 10.5 K/uL   RBC 4.19  3.87 - 5.11 MIL/uL   Hemoglobin 11.9 (*) 12.0 - 15.0 g/dL   HCT 16.1  09.6 - 04.5 %   MCV 89.0  78.0 - 100.0 fL   MCH 28.4  26.0 - 34.0 pg   MCHC 31.9  30.0 - 36.0 g/dL   RDW 40.9  81.1 - 91.4 %   Platelets 487 (*) 150 - 400 K/uL  CBC     Status: Abnormal   Collection Time    03/12/13  3:59 AM      Result Value Range   WBC 7.8  4.0 - 10.5 K/uL   RBC 3.91  3.87 - 5.11 MIL/uL   Hemoglobin 11.0 (*) 12.0 - 15.0 g/dL   HCT 78.2 (*) 95.6 - 21.3 %   MCV 88.0  78.0 - 100.0 fL   MCH 28.1  26.0 - 34.0 pg   MCHC 32.0  30.0 - 36.0 g/dL   RDW 08.6  57.8 - 46.9 %   Platelets 438 (*) 150 - 400 K/uL    No results found.  Review of Systems  Constitutional: Negative.   HENT: Negative.   Eyes: Negative.   Respiratory: Negative.   Cardiovascular:  Negative.   Gastrointestinal: Positive for nausea and abdominal pain.  Genitourinary: Negative.  Negative for hematuria and flank pain.  Musculoskeletal: Negative.   Skin: Negative.   Neurological: Negative.   Endo/Heme/Allergies: Negative.   Psychiatric/Behavioral: Negative.    Blood pressure 137/80, pulse 82, temperature 98.2 F (36.8 C), temperature source Oral, resp. rate 18, height 5\' 5"  (1.651 m), weight 88.67 kg (195 lb 7.7 oz), SpO2 100.00%. Physical Exam  Constitutional: She is oriented to person, place, and time. She appears well-developed and well-nourished.  HENT:  Head: Normocephalic and atraumatic.  Eyes: EOM are normal. Pupils are equal, round, and reactive to light.  Neck: Normal range of motion. Neck supple.  Cardiovascular: Normal rate.   Respiratory: Effort normal.  GI: Soft.  Lower abd incision c/d/i. Recent JP site c/d/i.  Genitourinary:  No CVAT  Musculoskeletal: Normal range of motion.  Neurological: She is alert and oriented to person, place, and time.  Skin: Skin is warm and dry.  Psychiatric: She has a normal mood and affect. Her behavior is normal. Judgment and thought content normal.    Assessment/Plan:  1 - Small Cystotomy - cystogram today shows no evidence of leak. Foley removed. Explained expected frequency / urgency which should improve over next few days. I will arrange outpatient f/u in about 3mos to verify no ongoing voiding issues.  2 - Call with questions.   Helen Young 03/12/2013, 12:01 PM

## 2013-03-13 NOTE — Progress Notes (Signed)
S: Voiding w/o complaints following DC foley yesterday.  O: >2L UOP SNTND Old JP site c/d/i  A/P:  1 - Cystotomy - doing well clinically following DC foley and cystogram that showed no extrav. I have arranged outpatient GU follow-up in about 3mos.  2 - Call with questions.

## 2013-03-13 NOTE — Progress Notes (Signed)
  Subjective: Still with intermittent cramping abdominal pain which she describes as moderate to severe. Currently has no nausea and is having BM's  Objective: Vital signs in last 24 hours: Temp:  [98.2 F (36.8 C)-98.6 F (37 C)] 98.2 F (36.8 C) (07/08 0517) Pulse Rate:  [81-88] 88 (07/08 0517) Resp:  [18] 18 (07/08 0517) BP: (129-166)/(77-96) 129/77 mmHg (07/08 0517) SpO2:  [95 %-98 %] 98 % (07/08 0517) Last BM Date: 03/12/13  Intake/Output from previous day: 07/07 0701 - 07/08 0700 In: 3366.3 [P.O.:360; I.V.:3006.3] Out: 3650 [Urine:3650] Intake/Output this shift: Total I/O In: 1626.3 [P.O.:120; I.V.:1506.3] Out: 2200 [Urine:2200]  Abdomen soft, non distended with only mild tenderness  Lab Results:   Recent Labs  03/11/13 0410 03/12/13 0359  WBC 7.4 7.8  HGB 11.9* 11.0*  HCT 37.3 34.4*  PLT 487* 438*   BMET  Recent Labs  03/10/13 0650 03/11/13 0410  NA 137 137  K 4.1 3.9  CL 100 101  CO2 28 30  GLUCOSE 114* 122*  BUN 13 7  CREATININE 0.65 0.68  CALCIUM 9.6 9.1   PT/INR No results found for this basename: LABPROT, INR,  in the last 72 hours ABG No results found for this basename: PHART, PCO2, PO2, HCO3,  in the last 72 hours  Studies/Results: Dg Cystogram  03/12/2013   *RADIOLOGY REPORT*  Clinical Data: Status post bladder repair on 03/02/2013.  Cystotomy was in the region of the anterior bladder dome.  CYSTOGRAM  Technique:  After catheterization of the urinary bladder following sterile technique the bladder was filled with 125 cc Cystograffin by drip infusion.  Serial spot images were obtained during bladder filling and post draining.  Fluoroscopy Time: 1 minute and 42 seconds  Comparison:  None.  Findings: Pre-procedure scout image shows a small amount of residual contrast material in the colon and rectum from the CT scan from 2 days ago.  However, it was felt that with the the patient's ability to roll, imaging could be obtained without  superimposition of the colonic contrast to allow diagnostic evaluation of the bladder.  Using the patient's existing Foley catheter, water-soluble contrast material was introduced in a retrograde fashion.  A total of approximately 125 ml of cystograffin was administered, at which point the patient became uncomfortable.  No evidence for contrast extravasation from the bladder lumen. There is a slight "peak" to the bladder dome, consistent with the site of repair.  Bladder contour is smooth throughout.  IMPRESSION: No evidence for contrast extravasation from the bladder dome.   Original Report Authenticated By: Kennith Center, M.D.    Anti-infectives: Anti-infectives   None      Assessment/Plan: s/p * No surgery found *  Still suspect ileus Will continue current course, clear liquids, and IV fluids.  If she doesn't improve in next 24 hours, will check SBFT  LOS: 3 days    Helen Young A 03/13/2013

## 2013-03-14 LAB — CBC
HCT: 33 % — ABNORMAL LOW (ref 36.0–46.0)
MCH: 28.2 pg (ref 26.0–34.0)
MCV: 87.8 fL (ref 78.0–100.0)
Platelets: 426 10*3/uL — ABNORMAL HIGH (ref 150–400)
RBC: 3.76 MIL/uL — ABNORMAL LOW (ref 3.87–5.11)

## 2013-03-14 LAB — BASIC METABOLIC PANEL
BUN: 3 mg/dL — ABNORMAL LOW (ref 6–23)
CO2: 30 mEq/L (ref 19–32)
Calcium: 9.2 mg/dL (ref 8.4–10.5)
Creatinine, Ser: 0.77 mg/dL (ref 0.50–1.10)

## 2013-03-14 NOTE — Discharge Summary (Signed)
Physician Discharge Summary  Patient ID: Helen Young MRN: [id number] DOB/AGE: 54-Dec-1962 54 y.o.  Admit date: 03/10/2013 Discharge date: 03/14/2013  Admission Diagnoses:  Discharge Diagnoses:  Principal Problem:   Enteritis post op Ileus  Discharged Condition: good  Hospital Course: she was admitted several days post op with abdominal pain, nausea, and vomiting.  CT demonstrated enteritis but ileus also suspected.  She was treated conservatively with bowel rest and IV rehydration.  She improved slowly and had BM's.  Once she improved further, her diet was started.  She tolerated liquids and was discharged home.  Consults: None  Significant Diagnostic Studies:   Treatments: IV hydration  Discharge Exam: Blood pressure 136/93, pulse 86, temperature 98.3 F (36.8 C), temperature source Oral, resp. rate 18, height 5\' 5"  (1.651 m), weight 195 lb 7.7 oz (88.67 kg), SpO2 94.00%. General appearance: alert, cooperative and no distress GI: soft, non-tender; bowel sounds normal; no masses,  no organomegaly  Disposition: 01-Home or Self Care   Future Appointments Provider Department Dept Phone   04/03/2013 12:15 PM Shelly Rubenstein, MD [facility], [place] [phone]       Medication List         amLODipine 5 MG tablet  Commonly known as:  NORVASC  Take 5 mg by mouth every evening.     cetirizine 10 MG tablet  Commonly known as:  ZYRTEC  Take 10 mg by mouth at bedtime.     estrogens (conjugated) 0.625 MG tablet  Commonly known as:  PREMARIN  Take 0.625 mg by mouth at bedtime.     HYDROcodone-acetaminophen 5-325 MG per tablet  Commonly known as:  NORCO/VICODIN  Take 1-2 tablets by mouth every 4 (four) hours as needed for pain.     meloxicam 7.5 MG tablet  Commonly known as:  MOBIC  Take 7.5 mg by mouth 2 (two) times daily as needed for pain.     mometasone 50 MCG/ACT nasal spray  Commonly known as:  NASONEX  Place 2 sprays into the nose daily as needed (for  allergies).     tetrahydrozoline 0.05 % ophthalmic solution  Place 1 drop into both eyes daily as needed (for dry eyes).     valsartan-hydrochlorothiazide 320-25 MG per tablet  Commonly known as:  DIOVAN-HCT  Take 1 tablet by mouth every evening.           Follow-up Information   Follow up with [facility] A, MD. Call on 04/03/2013.   Contact information:   [address] [place] [address] [phone]       Signed: Shelly Rubenstein 03/14/2013, 9:35 PM

## 2013-03-14 NOTE — Progress Notes (Signed)
  Subjective: Feeling much better No pain today  Objective: Vital signs in last 24 hours: Temp:  [98.3 F (36.8 C)-98.9 F (37.2 C)] 98.3 F (36.8 C) (07/09 0503) Pulse Rate:  [86-101] 86 (07/09 0503) Resp:  [18-20] 18 (07/09 0503) BP: (130-165)/(79-100) 136/93 mmHg (07/09 0503) SpO2:  [94 %-100 %] 94 % (07/09 0503) Last BM Date: 03/12/13  Intake/Output from previous day: 07/08 0701 - 07/09 0700 In: 3713.8 [P.O.:720; I.V.:2993.8] Out: 2350 [Urine:2250; Emesis/NG output:100] Intake/Output this shift: Total I/O In: 780 [P.O.:280; I.V.:500] Out: 250 [Urine:250]  Comfortable in appearance Abdomen soft, NT, ND  Lab Results:   Recent Labs  03/12/13 0359 03/14/13 0348  WBC 7.8 6.5  HGB 11.0* 10.6*  HCT 34.4* 33.0*  PLT 438* 426*   BMET  Recent Labs  03/14/13 0348  NA 135  K 3.5  CL 97  CO2 30  GLUCOSE 98  BUN 3*  CREATININE 0.77  CALCIUM 9.2   PT/INR No results found for this basename: LABPROT, INR,  in the last 72 hours ABG No results found for this basename: PHART, PCO2, PO2, HCO3,  in the last 72 hours  Studies/Results: No results found.  Anti-infectives: Anti-infectives   None      Assessment/Plan: s/p * No surgery found * Discharge  LOS: 4 days    Kourtnee Lahey A 03/14/2013

## 2013-03-30 ENCOUNTER — Telehealth (INDEPENDENT_AMBULATORY_CARE_PROVIDER_SITE_OTHER): Payer: Self-pay

## 2013-03-30 NOTE — Telephone Encounter (Signed)
Patient is asking for medication for Nausea please advise Rx [facility]  # [phone]

## 2013-03-30 NOTE — Telephone Encounter (Signed)
Can call in phenergan 25 mg po q 6 hrs prn #30 with no refills

## 2013-04-02 NOTE — Telephone Encounter (Signed)
F/U call to see if  Phenergan had been called in and picked up , patient stated she was not aware medication was available for her for her nausea . I ask her did she still had the nausea , she stated yes but no vomiting. I expressed I wished she had called the on call MD , she stated the nausea was not that bad  She was ok. I ask if iIcould call her medication for nausea  in now she stated she was out of town and would not be back until late tonight and she was ok, she has appointment tomorrow  With  Dr. Geroge Baseman and she would be ok . I apologized and advised her if she got worse to call and we would call something in near her location. Patient verbalized understanding

## 2013-04-03 ENCOUNTER — Encounter (INDEPENDENT_AMBULATORY_CARE_PROVIDER_SITE_OTHER): Payer: Self-pay | Admitting: Surgery

## 2013-04-03 ENCOUNTER — Other Ambulatory Visit (INDEPENDENT_AMBULATORY_CARE_PROVIDER_SITE_OTHER): Payer: Self-pay | Admitting: *Deleted

## 2013-04-03 ENCOUNTER — Telehealth (INDEPENDENT_AMBULATORY_CARE_PROVIDER_SITE_OTHER): Payer: Self-pay | Admitting: *Deleted

## 2013-04-03 ENCOUNTER — Ambulatory Visit (INDEPENDENT_AMBULATORY_CARE_PROVIDER_SITE_OTHER): Payer: BC Managed Care – PPO | Admitting: Surgery

## 2013-04-03 VITALS — BP 120/82 | HR 76 | Resp 16 | Ht 65.0 in | Wt 186.4 lb

## 2013-04-03 DIAGNOSIS — Z09 Encounter for follow-up examination after completed treatment for conditions other than malignant neoplasm: Secondary | ICD-10-CM

## 2013-04-03 MED ORDER — PROMETHAZINE HCL 25 MG PO TABS: 25.0000 mg | ORAL_TABLET | Freq: Four times a day (QID) | ORAL | Status: DC | PRN

## 2013-04-03 MED ORDER — HYDROCODONE-ACETAMINOPHEN 5-325 MG PO TABS: 1.0000 | ORAL_TABLET | ORAL | Status: DC | PRN

## 2013-04-03 NOTE — Progress Notes (Signed)
Subjective:     Patient ID: Helen Young, female   DOB: [birth year], 52 y.o.   MRN: [id number]  HPI She is here for a postoperative visit. Again she had a laparoscopic converted to open ventral hernia repair. At the time of surgery the bladder was entered as it was stuck to the abdominal wall. This was repaired by urology. She had a return to the hospital for enteritis. She is now improving from that and her Foley catheter has been removed. She still has some frequency. Her nausea and vomiting have subsided.  Review of Systems     Objective:   Physical Exam She looks well today. Her abdomen is soft and nontender. Her incision is healing well    Assessment:     Patient stable postop     Plan:     I believe it is now reasonable for her to work from home. 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 I do not believe she is ready for that yet. I will see her back in 2 weeks and will determine when she is ready to drive that distance. I will renew her hydrocodone.

## 2013-04-03 NOTE — Telephone Encounter (Signed)
Patient called to state that she went to the pharmacy to pick up her Phenergan however there was none called in.  Patient explained that she was told the other day it was approved by Dr. Magnus Ivan and called in.  This RN does see the note from [facility] which contains approval for Phenergan by Dr. Magnus Ivan but then the note states patient thinks she will be ok until she see's Dr. Magnus Ivan so it was not called in.  Patient states Dr. Magnus Ivan told her to go ahead and get the prescription for Phenergan to use as needed.  This RN did go ahead and prescribe the Phenergan which was escribed to patient's pharmacy based on the approval Dr. Magnus Ivan gave on 03/30/13.  Patient updated at this time and agreeable.

## 2013-04-06 ENCOUNTER — Encounter (INDEPENDENT_AMBULATORY_CARE_PROVIDER_SITE_OTHER): Payer: Self-pay | Admitting: General Surgery

## 2013-04-20 ENCOUNTER — Encounter (INDEPENDENT_AMBULATORY_CARE_PROVIDER_SITE_OTHER): Payer: Self-pay | Admitting: Surgery

## 2013-04-20 ENCOUNTER — Ambulatory Visit (INDEPENDENT_AMBULATORY_CARE_PROVIDER_SITE_OTHER): Payer: BC Managed Care – PPO | Admitting: Surgery

## 2013-04-20 VITALS — BP 120/68 | HR 64 | Temp 98.7°F | Resp 14 | Ht 65.0 in | Wt 185.8 lb

## 2013-04-20 DIAGNOSIS — Z09 Encounter for follow-up examination after completed treatment for conditions other than malignant neoplasm: Secondary | ICD-10-CM

## 2013-04-20 NOTE — Progress Notes (Signed)
Subjective:     Patient ID: Helen Young, female   DOB: 06-26-1961, 52 y.o.   MRN: [id number]  HPI She is here for a postop visit. She continues to improve. Her bowel function is normal. Her urinary function is improving  Review of Systems     Objective:   Physical Exam On exam, her abdomen is soft and her incision is healing well    Assessment:     Patient stable postop     Plan:     She may now returned to her traveling as far as driving to [place] but she cannot travel out of state or airplane until October 1. I will see her back at the end of September

## 2013-04-26 ENCOUNTER — Telehealth (INDEPENDENT_AMBULATORY_CARE_PROVIDER_SITE_OTHER): Payer: Self-pay

## 2013-04-26 ENCOUNTER — Encounter (INDEPENDENT_AMBULATORY_CARE_PROVIDER_SITE_OTHER): Payer: Self-pay

## 2013-04-26 NOTE — Telephone Encounter (Signed)
Pt returned to work after surgery but is having more pain and soreness.  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  Dr. Eliberto Ivory last office note documented same, and his nurse confirmed as well.  Pt will be written out of work until October 1.  She will p/u her letter this afternoon.

## 2013-04-30 ENCOUNTER — Encounter (INDEPENDENT_AMBULATORY_CARE_PROVIDER_SITE_OTHER): Payer: Self-pay | Admitting: General Surgery

## 2013-04-30 ENCOUNTER — Telehealth (INDEPENDENT_AMBULATORY_CARE_PROVIDER_SITE_OTHER): Payer: Self-pay

## 2013-04-30 ENCOUNTER — Encounter (INDEPENDENT_AMBULATORY_CARE_PROVIDER_SITE_OTHER): Payer: Self-pay

## 2013-04-30 NOTE — Telephone Encounter (Signed)
Pt would like a letter from Dr. Magnus Ivan stating that she is released to work 20 hours from home until June 06, 2013.  She will come p/u the letter today.

## 2013-05-15 ENCOUNTER — Encounter (INDEPENDENT_AMBULATORY_CARE_PROVIDER_SITE_OTHER): Payer: Self-pay | Admitting: General Surgery

## 2013-06-06 ENCOUNTER — Encounter (INDEPENDENT_AMBULATORY_CARE_PROVIDER_SITE_OTHER): Payer: Self-pay | Admitting: Surgery

## 2013-06-06 ENCOUNTER — Ambulatory Visit (INDEPENDENT_AMBULATORY_CARE_PROVIDER_SITE_OTHER): Payer: BC Managed Care – PPO | Admitting: Surgery

## 2013-06-06 VITALS — BP 125/88 | HR 62 | Temp 97.8°F | Resp 14 | Ht 65.0 in | Wt 188.8 lb

## 2013-06-06 DIAGNOSIS — Z09 Encounter for follow-up examination after completed treatment for conditions other than malignant neoplasm: Secondary | ICD-10-CM

## 2013-06-06 NOTE — Progress Notes (Signed)
Subjective:     Patient ID: Helen Young, female   DOB: 07-31-1961, 52 y.o.   MRN: [id number]  HPI She is here for her final postoperative visit. She has continued to improve. She has only mild discomfort in the lower belly. She is back to normal from a urinary standpoint  Review of Systems     Objective:   Physical Exam On exam, her incision is well healed and her abdomen is soft and nontender today    Assessment:     Patient stable postoperatively     Plan:     I am going to allow her to start traveling now. She will do this intermittently Monday/Wednesday/Friday for 2 weeks and then return to full time on October 20. I will see her back as needed

## 2013-07-12 ENCOUNTER — Other Ambulatory Visit: Payer: Self-pay

## 2013-08-03 ENCOUNTER — Ambulatory Visit (INDEPENDENT_AMBULATORY_CARE_PROVIDER_SITE_OTHER): Payer: BC Managed Care – PPO | Admitting: Family Medicine

## 2013-08-03 ENCOUNTER — Ambulatory Visit (HOSPITAL_BASED_OUTPATIENT_CLINIC_OR_DEPARTMENT_OTHER)
Admission: RE | Admit: 2013-08-03 | Discharge: 2013-08-03 | Disposition: A | Discharge status: Home / Self Care | Payer: BC Managed Care – PPO | Source: Ambulatory Visit | Attending: Family Medicine | Admitting: Family Medicine

## 2013-08-03 ENCOUNTER — Encounter: Payer: Self-pay | Admitting: Family Medicine

## 2013-08-03 VITALS — BP 120/70 | HR 88 | Resp 16 | Wt 191.8 lb

## 2013-08-03 DIAGNOSIS — Z23 Encounter for immunization: Secondary | ICD-10-CM

## 2013-08-03 DIAGNOSIS — Z90710 Acquired absence of both cervix and uterus: Secondary | ICD-10-CM | POA: Insufficient documentation

## 2013-08-03 DIAGNOSIS — I998 Other disorder of circulatory system: Secondary | ICD-10-CM | POA: Insufficient documentation

## 2013-08-03 DIAGNOSIS — J984 Other disorders of lung: Secondary | ICD-10-CM | POA: Insufficient documentation

## 2013-08-03 DIAGNOSIS — R1032 Left lower quadrant pain: Secondary | ICD-10-CM

## 2013-08-03 LAB — CBC WITH DIFFERENTIAL/PLATELET
Basophils Absolute: 0 10*3/uL (ref 0.0–0.1)
Eosinophils Absolute: 0.2 10*3/uL (ref 0.0–0.7)
Hemoglobin: 13.3 g/dL (ref 12.0–15.0)
Lymphocytes Relative: 49.2 % — ABNORMAL HIGH (ref 12.0–46.0)
MCHC: 33.2 g/dL (ref 30.0–36.0)
Monocytes Relative: 7.8 % (ref 3.0–12.0)
Neutrophils Relative %: 39 % — ABNORMAL LOW (ref 43.0–77.0)
Platelets: 335 10*3/uL (ref 150.0–400.0)
RDW: 12.5 % (ref 11.5–14.6)

## 2013-08-03 LAB — BASIC METABOLIC PANEL
BUN: 11 mg/dL (ref 6–23)
CO2: 28 mEq/L (ref 19–32)
Calcium: 9.5 mg/dL (ref 8.4–10.5)
Creatinine, Ser: 0.7 mg/dL (ref 0.4–1.2)
GFR: 112.14 mL/min (ref >60.00)
Glucose, Bld: 82 mg/dL (ref 70–99)
Sodium: 137 mEq/L (ref 135–145)

## 2013-08-03 LAB — HEPATIC FUNCTION PANEL
AST: 18 U/L (ref 0–37)
Albumin: 4.1 g/dL (ref 3.5–5.2)
Alkaline Phosphatase: 46 U/L (ref 39–117)
Total Bilirubin: 0.4 mg/dL (ref 0.3–1.2)

## 2013-08-03 MED ORDER — IOHEXOL 300 MG/ML  SOLN
100.0000 mL | Freq: Once | INTRAMUSCULAR | Status: AC | PRN
  Administered 2013-08-03: 100 mL via INTRAVENOUS

## 2013-08-03 NOTE — Progress Notes (Signed)
  Subjective:     Helen Young is a 52 y.o. female who presents for evaluation of abdominal pain. Onset was 8 months ago. Symptoms have been gradually worsening. The pain is described as dull, and is 4/10 in intensity. Pain is located in the LLQ without radiation.  Aggravating factors: none.  Alleviating factors: none. Associated symptoms: none. The patient denies anorexia, arthralagias, belching, chills, constipation, diarrhea, dysuria, fever, flatus, frequency, headache, hematochezia, hematuria, melena, myalgias, nausea, sweats and vomiting.  The patient's history has been marked as reviewed and updated as appropriate.  Review of Systems Pertinent items are noted in HPI.     Objective:    BP 120/70  Pulse 88  Resp 16  Wt 191 lb 12.8 oz (87 kg)  SpO2 99% General appearance: alert, cooperative, appears stated age and no distress Throat: lips, mucosa, and tongue normal; teeth and gums normal Lungs: clear to auscultation bilaterally Heart: S1, S2 normal Extremities: extremities normal, atraumatic, no cyanosis or edema   abd=== + LLQ pain,  + guarding, no rebound---? hernia Assessment:    Abdominal pain, -- ? hernia .    Plan:    The diagnosis was discussed with the patient and evaluation and treatment plans outlined. See orders for lab and imaging studies. Follow up as needed. labs and ct ordered

## 2013-08-03 NOTE — Patient Instructions (Signed)

## 2013-08-03 NOTE — Progress Notes (Signed)
Pre visit review using our clinic review tool, if applicable. No additional management support is needed unless otherwise documented below in the visit note. 

## 2013-08-06 ENCOUNTER — Encounter: Payer: Self-pay | Admitting: Family Medicine

## 2013-08-07 ENCOUNTER — Other Ambulatory Visit: Payer: Self-pay

## 2013-08-07 MED ORDER — POTASSIUM CHLORIDE CRYS ER 20 MEQ PO TBCR: 20.0000 meq | EXTENDED_RELEASE_TABLET | Freq: Once | ORAL | Status: DC

## 2013-08-31 ENCOUNTER — Ambulatory Visit (INDEPENDENT_AMBULATORY_CARE_PROVIDER_SITE_OTHER): Payer: BC Managed Care – PPO | Admitting: Family Medicine

## 2013-08-31 ENCOUNTER — Encounter: Payer: Self-pay | Admitting: Family Medicine

## 2013-08-31 VITALS — BP 130/88 | HR 94 | Temp 99.2°F | Resp 17 | Wt 193.5 lb

## 2013-08-31 DIAGNOSIS — J0100 Acute maxillary sinusitis, unspecified: Secondary | ICD-10-CM

## 2013-08-31 MED ORDER — AMOXICILLIN 875 MG PO TABS: 875.0000 mg | ORAL_TABLET | Freq: Two times a day (BID) | ORAL | Status: DC

## 2013-08-31 MED ORDER — PROMETHAZINE-DM 6.25-15 MG/5ML PO SYRP: 5.0000 mL | ORAL_SOLUTION | Freq: Four times a day (QID) | ORAL | Status: DC | PRN

## 2013-08-31 NOTE — Assessment & Plan Note (Signed)
New.  Pt's sxs and PE consistent w/ infxn.  Start abx.  Cough syrup prn.  Reviewed supportive care and red flags that should prompt return.  Pt expressed understanding and is in agreement w/ plan.  

## 2013-08-31 NOTE — Patient Instructions (Signed)
Follow up as needed Start the Amoxicillin twice daily- take w/ food Drink plenty of fluids Use the cough syrup as needed REST! Call with any questions or concerns Hang in there! Happy New Year!

## 2013-08-31 NOTE — Progress Notes (Signed)
Pre visit review using our clinic review tool, if applicable. No additional management support is needed unless otherwise documented below in the visit note. 

## 2013-08-31 NOTE — Progress Notes (Signed)
   Subjective:    Patient ID: Helen Young, female    DOB: 29-Nov-1960, 52 y.o.   MRN: [id number]  HPI URI- sxs started Monday w/ nasal congestion.  + maxillary sinus pain.  + tooth pain.  Cough- starting to be productive, particularly at night.  Bilateral ear fullness.  + sore throat, PND.  Subjective low grade temps.  + sick contacts.  No N/V/D.   Review of Systems For ROS see HPI     Objective:   Physical Exam  Vitals reviewed. Constitutional: She appears well-developed and well-nourished. No distress.  HENT:  Head: Normocephalic and atraumatic.  Right Ear: Tympanic membrane normal.  Left Ear: Tympanic membrane normal.  Nose: Mucosal edema and rhinorrhea present. Right sinus exhibits maxillary sinus tenderness and frontal sinus tenderness. Left sinus exhibits maxillary sinus tenderness and frontal sinus tenderness.  Mouth/Throat: Uvula is midline and mucous membranes are normal. Posterior oropharyngeal erythema present. No oropharyngeal exudate.  Eyes: Conjunctivae and EOM are normal. Pupils are equal, round, and reactive to light.  Neck: Normal range of motion. Neck supple.  Cardiovascular: Normal rate, regular rhythm and normal heart sounds.   Pulmonary/Chest: Effort normal and breath sounds normal. No respiratory distress. She has no wheezes.  Lymphadenopathy:    She has no cervical adenopathy.          Assessment & Plan:

## 2013-09-09 ENCOUNTER — Other Ambulatory Visit: Payer: Self-pay | Admitting: Family Medicine

## 2013-09-10 ENCOUNTER — Telehealth: Payer: Self-pay | Admitting: Family Medicine

## 2013-09-10 ENCOUNTER — Other Ambulatory Visit: Payer: Self-pay | Admitting: Family Medicine

## 2013-09-10 DIAGNOSIS — Z0000 Encounter for general adult medical examination without abnormal findings: Secondary | ICD-10-CM

## 2013-09-10 NOTE — Telephone Encounter (Signed)
Please advise      KP 

## 2013-09-10 NOTE — Telephone Encounter (Signed)
Referral put in.

## 2013-09-10 NOTE — Telephone Encounter (Signed)
Patient states that her GI office in [place] called to notify her that she was due for a follow-up colonoscopy. Patient states that since she now lives in [place] she would like to have it done by someone locally. Please advise on referral.

## 2013-09-12 NOTE — Telephone Encounter (Signed)
Med filled.  

## 2013-09-13 ENCOUNTER — Encounter: Payer: Self-pay | Admitting: Gastroenterology

## 2013-09-28 ENCOUNTER — Telehealth: Payer: Self-pay | Admitting: *Deleted

## 2013-09-28 NOTE — Telephone Encounter (Signed)
Colonoscopy 03/2004. Dr. Jodi Mourning in [place], [place]: done for FH of colon cancer, LLQ pain: findings "no polyps" but tortuous sigmoid colon. Colonoscopy 06/2008, Dr. Jodi Mourning; done for FH of colon cancer; findings "three sessile polyps of benign appearance 2-63mm size in rectum, cauterized without biopsy"  He recommended recall colonoscopy in 3 years Colonoscopy 10/2010 Dr. Jodi Mourning; done for personal history of polyps (Unknown pathology see above); FH of colon cancer; findings "normal colonoscopy to cecum" he recommended recall colonoscopy in 3 years   Please cancel her upcoming direct colonoscopy.  I don't think she needs repeat colonoscopy until at least 10/2015 (5 years from her last normal examination).  Please offer her NGI appt in office to discuss these recommendations.

## 2013-09-28 NOTE — Telephone Encounter (Signed)
Left message on cell phone# for patient to call us back. Explained that recall colon not due until 2017 per Dr.Jacobs (after reviewing her records), and he would like to offer her appointment for office visit to discuss this. Colonoscopy appointment cancelled at this time. thanks

## 2013-09-28 NOTE — Telephone Encounter (Signed)
Spoke with patient, made office visit for 10-26-13 at 9:00 am per patient's request. RM

## 2013-09-28 NOTE — Telephone Encounter (Signed)
Patient is direct for colonoscopy on 10-19-13. Personal HX colon cancer (2012) in [place]. Records on your desk for review. OK for direct colonoscopy? Thanks!

## 2013-10-15 ENCOUNTER — Ambulatory Visit (INDEPENDENT_AMBULATORY_CARE_PROVIDER_SITE_OTHER): Payer: BC Managed Care – PPO | Admitting: Family Medicine

## 2013-10-15 ENCOUNTER — Encounter: Payer: Self-pay | Admitting: Family Medicine

## 2013-10-15 VITALS — BP 122/76 | Temp 98.5°F | Wt 196.0 lb

## 2013-10-15 DIAGNOSIS — R109 Unspecified abdominal pain: Secondary | ICD-10-CM

## 2013-10-15 DIAGNOSIS — R319 Hematuria, unspecified: Secondary | ICD-10-CM

## 2013-10-15 DIAGNOSIS — E669 Obesity, unspecified: Secondary | ICD-10-CM | POA: Insufficient documentation

## 2013-10-15 DIAGNOSIS — R1900 Intra-abdominal and pelvic swelling, mass and lump, unspecified site: Secondary | ICD-10-CM

## 2013-10-15 LAB — POCT URINALYSIS DIPSTICK
BILIRUBIN UA: NEGATIVE
Glucose, UA: NEGATIVE
Ketones, UA: NEGATIVE
Leukocytes, UA: NEGATIVE
NITRITE UA: NEGATIVE
SPEC GRAV UA: 1.025
Urobilinogen, UA: 0.2
pH, UA: 6

## 2013-10-15 NOTE — Progress Notes (Signed)
Pre visit review using our clinic review tool, if applicable. No additional management support is needed unless otherwise documented below in the visit note. 

## 2013-10-15 NOTE — Patient Instructions (Signed)
Abdominal Pain, Adult °Many things can cause abdominal pain. Usually, abdominal pain is not caused by a disease and will improve without treatment. It can often be observed and treated at home. Your health care provider will do a physical exam and possibly order blood tests and X-rays to help determine the seriousness of your pain. However, in many cases, more time must pass before a clear cause of the pain can be found. Before that point, your health care provider may not know if you need more testing or further treatment. °HOME CARE INSTRUCTIONS  °Monitor your abdominal pain for any changes. The following actions may help to alleviate any discomfort you are experiencing: °· Only take over-the-counter or prescription medicines as directed by your health care provider. °· Do not take laxatives unless directed to do so by your health care provider. °· Try a clear liquid diet (broth, tea, or water) as directed by your health care provider. Slowly move to a bland diet as tolerated. °SEEK MEDICAL CARE IF: °· You have unexplained abdominal pain. °· You have abdominal pain associated with nausea or diarrhea. °· You have pain when you urinate or have a bowel movement. °· You experience abdominal pain that wakes you in the night. °· You have abdominal pain that is worsened or improved by eating food. °· You have abdominal pain that is worsened with eating fatty foods. °SEEK IMMEDIATE MEDICAL CARE IF:  °· Your pain does not go away within 2 hours. °· You have a fever. °· You keep throwing up (vomiting). °· Your pain is felt only in portions of the abdomen, such as the right side or the left lower portion of the abdomen. °· You pass bloody or black tarry stools. °MAKE SURE YOU: °· Understand these instructions.   °· Will watch your condition.   °· Will get help right away if you are not doing well or get worse.   °Document Released: 06/02/2005 Document Revised: 06/13/2013 Document Reviewed: 05/02/2013 °[employer]® Patient  Information ©2014 [employer]. ° °

## 2013-10-16 ENCOUNTER — Other Ambulatory Visit: Payer: Self-pay | Admitting: Family Medicine

## 2013-10-16 DIAGNOSIS — R1900 Intra-abdominal and pelvic swelling, mass and lump, unspecified site: Secondary | ICD-10-CM

## 2013-10-16 LAB — URINE CULTURE: Colony Count: 60000

## 2013-10-17 ENCOUNTER — Telehealth: Payer: Self-pay

## 2013-10-17 NOTE — Telephone Encounter (Signed)
Patient has an appointment tomorrow at 9:30am for an abdominal ultrasound at the [facility].  The order was put in for patient to receive an ABD Complete for a palpable nodule, lower abdomen.  Gerald Stabs, the Ultrasound technician stated that the ultrasound ordered would not cover the lower abdomen and wants clarification on whether to change the ultrasound from a ABD Complete to a ABD Limited or Pelvis Limited.  Please advise.

## 2013-10-18 ENCOUNTER — Other Ambulatory Visit: Payer: Self-pay | Admitting: Family Medicine

## 2013-10-18 ENCOUNTER — Ambulatory Visit (HOSPITAL_BASED_OUTPATIENT_CLINIC_OR_DEPARTMENT_OTHER)
Admission: RE | Admit: 2013-10-18 | Discharge: 2013-10-18 | Disposition: A | Discharge status: Home / Self Care | Payer: BC Managed Care – PPO | Source: Ambulatory Visit | Attending: Family Medicine | Admitting: Family Medicine

## 2013-10-18 DIAGNOSIS — R1904 Left lower quadrant abdominal swelling, mass and lump: Secondary | ICD-10-CM | POA: Insufficient documentation

## 2013-10-18 DIAGNOSIS — R1900 Intra-abdominal and pelvic swelling, mass and lump, unspecified site: Secondary | ICD-10-CM

## 2013-10-18 NOTE — Telephone Encounter (Signed)
Please advise 

## 2013-10-18 NOTE — Telephone Encounter (Signed)
Already done

## 2013-10-18 NOTE — Telephone Encounter (Signed)
Please advise      KP 

## 2013-10-19 ENCOUNTER — Encounter: Payer: BC Managed Care – PPO | Admitting: Gastroenterology

## 2013-10-26 ENCOUNTER — Encounter: Payer: Self-pay | Admitting: Gastroenterology

## 2013-10-26 ENCOUNTER — Ambulatory Visit (INDEPENDENT_AMBULATORY_CARE_PROVIDER_SITE_OTHER): Payer: BC Managed Care – PPO | Admitting: Gastroenterology

## 2013-10-26 VITALS — BP 122/98 | HR 96 | Ht 65.0 in | Wt 198.1 lb

## 2013-10-26 DIAGNOSIS — Z800 Family history of malignant neoplasm of digestive organs: Secondary | ICD-10-CM

## 2013-10-26 DIAGNOSIS — R1032 Left lower quadrant pain: Secondary | ICD-10-CM

## 2013-10-26 NOTE — Patient Instructions (Signed)
Your pains are almost certainly related to abdominal scar tissue. Take tylenol (2 extra strength at bedtime every night). You need recall colonoscopy in February 2017 for FH of colon cancer (father at young age, 50s).

## 2013-10-26 NOTE — Progress Notes (Signed)
Colonoscopy 03/2004. Dr. Jodi Mourning in [place], [place]: done for FH of colon cancer, LLQ pain: findings "no polyps" but tortuous sigmoid colon. This was done when patient was 53 years old. Colonoscopy 06/2008, Dr. Jodi Mourning; done for FH of colon cancer; findings "three sessile polyps of benign appearance 2-34mm size in rectum, cauterized without biopsy" He recommended recall colonoscopy in 3 years  Colonoscopy 10/2010 Dr. Jodi Mourning; done for personal history of polyps (Unknown pathology see above); FH of colon cancer; findings "normal colonoscopy to cecum" he recommended recall colonoscopy in 3 years   CT scan with iv contrast 08/2013 for LLQ pain; No acute abnormality in the abdomen or pelvis to explain the  patient's clinical symptoms. Expected evolution of postoperative changes in the ventral abdominal wall and bilateral lateral inguinal recess is. 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   HPI: This is a   very pleasant 53 year old woman whom I meeting for the first time today.   Father had colon cancer.  Diagnosed in his 84's with it.  Still living; treated with surgery and chemo.  She has pains in llq, below the navel can be sore and tender.  The pain is intermttent, standing makes it worse.  Can grab or pull when standing.  Bowel movement do not change the pain, neither does eating.  Started about a year.  Hernia surgery did not help her pains at all.  She has had other left sided hernia (ingunial?, then second hernia surgery, then Dr. Pryor Montes surgery).  Never sees blood in her stool.  No constipation.  FOBT testing with PCP recently was negative.  Overall in past year she has gained about 15 pounds in past year.  She can have a lot of nausea.   The thought the pains may be related to hernia.  INcisional hernia, repaired and tacked her her bladder. Noted a lot of adhesion.   Review of systems: Pertinent positive and negative review of systems  were noted in the above HPI section. Complete review of systems was performed and was otherwise normal.    Past Medical History  Diagnosis Date  . HTN (hypertension)   . Environmental allergies 02-27-13    OTC meds daily  . Headache(784.0) 02-27-13    occ. migraines  . Transfusion history     childhood s/p Tonsillectomy    Past Surgical History  Procedure Laterality Date  . Inguinal hernia repair      x's 2   . Knee surgery Left   . Total abdominal hysterectomy w/ bilateral salpingoophorectomy    . Neck surgery      Disc replaced with a steel plate  . Tonsillectomy      childhoood  . Abdominal hysterectomy    . Incisional hernia repair N/A 03/02/2013    Procedure: LAPAROSCOPIC INCISIONAL HERNIA CONVERTED TO OPEN ;  Surgeon: Harl Bowie, MD;  Location: [facility];  Service: General;  Laterality: N/A;  . Cystostomy N/A 03/02/2013    Procedure: CYSTOSTOMY CLOSURE ;  Surgeon: Alexis Frock, MD;  Location: [facility];  Service: Urology;  Laterality: N/A;    Current Outpatient Prescriptions  Medication Sig Dispense Refill  . amLODipine (NORVASC) 5 MG tablet Take 5 mg by mouth every evening.      . cetirizine (ZYRTEC) 10 MG tablet Take 10 mg by mouth at bedtime.       . Cholecalciferol (VITAMIN D3) 5000 UNITS CAPS Take 5,000 Units by mouth 2 (two) times a week.      Marland Kitchen  mometasone (NASONEX) 50 MCG/ACT nasal spray Place 2 sprays into the nose daily as needed (for allergies).       . potassium chloride SA (K-DUR,KLOR-CON) 20 MEQ tablet Take 1 tablet (20 mEq total) by mouth once.  30 tablet  2  . PREMARIN 0.625 MG tablet TAKE ONE TABLET BY MOUTH DAILY  30 tablet  5  . tetrahydrozoline 0.05 % ophthalmic solution Place 1 drop into both eyes daily as needed (for dry eyes).      . valsartan-hydrochlorothiazide (DIOVAN-HCT) 320-25 MG per tablet Take 1 tablet by mouth every evening.       No current facility-administered medications for this visit.    Allergies as of 10/26/2013 - Review  Complete 10/26/2013  Allergen Reaction Noted  . Percocet [oxycodone-acetaminophen] Swelling 04/06/2012    Family History  Problem Relation Age of Onset  . Colon cancer Father   . Prostate cancer Father   . Heart disease Father   . Hypertension Father   . Heart disease Mother   . Stroke Mother   . Hypertension Mother   . Kidney disease Maternal Grandfather   . Diabetes Maternal Grandfather   . Sudden death Paternal Grandfather   . Sudden death Paternal Uncle     Cardiac issues    History   Social History  . Marital Status: Widowed    Spouse Name: N/A    Number of Children: 3  . Years of Education: N/A   Occupational History  . [employer]    Social History Main Topics  . Smoking status: Former Smoker    Quit date: 09/06/2005  . Smokeless tobacco: Never Used  . Alcohol Use: Yes     Comment: Rarely  . Drug Use: No  . Sexual Activity: Yes   Other Topics Concern  . Not on file   Social History Narrative  . No narrative on file       Physical Exam: BP 122/98  Pulse 96  Ht 5\' 5"  (1.651 m)  Wt 198 lb 2 oz (89.869 kg)  BMI 32.97 kg/m2 Constitutional: generally well-appearing Psychiatric: alert and oriented x3 Eyes: extraocular movements intact Mouth: oral pharynx moist, no lesions Neck: supple no lymphadenopathy Cardiovascular: heart regular rate and rhythm Lungs: clear to auscultation bilaterally Abdomen: soft, nontender, nondistended, no obvious ascites, no peritoneal signs, normal bowel sounds Extremities: no lower extremity edema bilaterally Skin: no lesions on visible extremities    Assessment and plan: 53 y.o. female with  intermittent positional left-sided abdominal pains that is likely adhesion related   She has had 3 left-sided abdominal hernia surgeries and her last one the surgeon documented a "large amount of adhesions". Her pain is positional and I suspect is related to adhesions. She understands these can be very difficult to  prove to be the diagnosis, can also be very difficult to treat. Surgery will be out of the question unless she has significant complication from adhesions. She tells me Tylenol usually works for her pain and I suggested that she does take that on as needed or even scheduled basis before she goes to bed since that is when she is most bothered. She will be due for colon cancer screening in about 2 years given her family history of colon cancer, her father at a young age.

## 2013-11-17 ENCOUNTER — Other Ambulatory Visit: Payer: Self-pay | Admitting: Family Medicine

## 2013-12-10 ENCOUNTER — Ambulatory Visit (INDEPENDENT_AMBULATORY_CARE_PROVIDER_SITE_OTHER): Payer: BC Managed Care – PPO | Admitting: Physician Assistant

## 2013-12-10 ENCOUNTER — Encounter: Payer: Self-pay | Admitting: Physician Assistant

## 2013-12-10 VITALS — BP 144/86 | HR 84 | Temp 98.4°F | Wt 201.0 lb

## 2013-12-10 DIAGNOSIS — M62838 Other muscle spasm: Secondary | ICD-10-CM

## 2013-12-10 MED ORDER — METHOCARBAMOL 500 MG PO TABS: 500.0000 mg | ORAL_TABLET | Freq: Three times a day (TID) | ORAL | Status: DC

## 2013-12-10 NOTE — Assessment & Plan Note (Signed)
Rx Robaxin.  Alternate Tylenol and Ibuprofen if needed.  Apply topical Aspercreme and Icy Hot.  Heating pad to area.  Avoid heavy lifting and overexertion.  Activity as tolerated.  Call or return to clinic if symptoms do not continue to improve.

## 2013-12-10 NOTE — Progress Notes (Signed)
Pre-visit discussion using our clinic review tool. No additional management support is needed unless otherwise documented below in the visit note.  

## 2013-12-10 NOTE — Patient Instructions (Signed)
Please use Robaxin as directed.  Alternate Tylenol and Ibuprofen as needed for neck/back pain.  Apply topical Salon Pas or Icy Hot to affected area.  Apply heating pad to area for 15 minutes, 3-4 x day.  Call or return to clinic if symptoms not improving over the next week.  The number to my usual office is [phone].

## 2013-12-10 NOTE — Progress Notes (Signed)
Patient presents to clinic today c/o neck tightness and tenderness over the past week.  Patient states symptoms are improving but she wanted to get it checked out.  Symptoms started earlier in the week after a long plane flight to [place].  Patient with history of significant scoliosis and disc disease.  Patient has not taken much for pain.  Denies decreased ROM but does note some soreness with ROM of LUE.  Denies trauma or injury.  Past Medical History  Diagnosis Date  . HTN (hypertension)   . Environmental allergies 02-27-13    OTC meds daily  . Headache(784.0) 02-27-13    occ. migraines  . Transfusion history     childhood s/p Tonsillectomy    Current Outpatient Prescriptions on File Prior to Visit  Medication Sig Dispense Refill  . amLODipine (NORVASC) 5 MG tablet TAKE 1 TABLET BY MOUTH DAILY.  30 tablet  2  . cetirizine (ZYRTEC) 10 MG tablet Take 10 mg by mouth at bedtime.       . Cholecalciferol (VITAMIN D3) 5000 UNITS CAPS Take 5,000 Units by mouth 2 (two) times a week.      . mometasone (NASONEX) 50 MCG/ACT nasal spray Place 2 sprays into the nose daily as needed (for allergies).       . potassium chloride SA (K-DUR,KLOR-CON) 20 MEQ tablet Take 1 tablet (20 mEq total) by mouth once.  30 tablet  2  . PREMARIN 0.625 MG tablet TAKE ONE TABLET BY MOUTH DAILY  30 tablet  5  . tetrahydrozoline 0.05 % ophthalmic solution Place 1 drop into both eyes daily as needed (for dry eyes).      . valsartan-hydrochlorothiazide (DIOVAN-HCT) 320-25 MG per tablet TAKE 1 TABLET BY MOUTH EVERY DAY  30 tablet  2   No current facility-administered medications on file prior to visit.    Allergies  Allergen Reactions  . Fish Allergy Itching and Swelling  . Percocet [Oxycodone-Acetaminophen] Swelling    Family History  Problem Relation Age of Onset  . Colon cancer Father   . Prostate cancer Father   . Heart disease Father   . Hypertension Father   . Heart disease Mother   . Stroke Mother   .  Hypertension Mother   . Kidney disease Maternal Grandfather   . Diabetes Maternal Grandfather   . Sudden death Paternal Grandfather   . Sudden death Paternal Uncle     Cardiac issues    History   Social History  . Marital Status: Widowed    Spouse Name: N/A    Number of Children: 3  . Years of Education: N/A   Occupational History  . [employer]    Social History Main Topics  . Smoking status: Former Smoker    Quit date: 09/06/2005  . Smokeless tobacco: Never Used  . Alcohol Use: Yes     Comment: Rarely  . Drug Use: No  . Sexual Activity: Yes   Other Topics Concern  . Not on file   Social History Narrative  . No narrative on file    Review of Systems - See HPI.  All other ROS are negative.  BP 144/86  Pulse 84  Temp(Src) 98.4 F (36.9 C) (Oral)  Wt 201 lb (91.173 kg)  SpO2 98%  Physical Exam  Constitutional: She is oriented to person, place, and time and well-developed, well-nourished, and in no distress.  HENT:  Head: Normocephalic and atraumatic.  Eyes: Conjunctivae are normal. Pupils are equal, round, and reactive to light.  Neck: Normal range of motion. Neck supple. Muscular tenderness present. No spinous process tenderness present. No rigidity. Normal range of motion present.  Cardiovascular: Normal rate, regular rhythm, normal heart sounds and intact distal pulses.   Pulmonary/Chest: Effort normal and breath sounds normal. No respiratory distress. She has no wheezes. She has no rales. She exhibits no tenderness.  Musculoskeletal:       Cervical back: She exhibits tenderness and spasm. She exhibits no bony tenderness.       Thoracic back: She exhibits tenderness and spasm. She exhibits no bony tenderness.  Neurological: She is alert and oriented to person, place, and time.  Skin: Skin is warm and dry. No rash noted.  Psychiatric: Affect normal.   Recent Results (from the past 2160 hour(s))  POCT URINALYSIS DIPSTICK     Status: Abnormal    Collection Time    10/15/13  2:54 PM      Result Value Ref Range   Color, UA Yellow     Clarity, UA Hazy     Glucose, UA Neg     Bilirubin, UA Neg     Ketones, UA Neg     Spec Grav, UA 1.025     Blood, UA Trace     pH, UA 6.0     Protein, UA Trace     Urobilinogen, UA 0.2     Nitrite, UA Neg     Leukocytes, UA Negative    URINE CULTURE     Status: None   Collection Time    10/15/13  4:30 PM      Result Value Ref Range   Colony Count 60,000 COLONIES/ML     Organism ID, Bacteria Multiple bacterial morphotypes present, none     Organism ID, Bacteria predominant. Suggest appropriate recollection if      Organism ID, Bacteria clinically indicated.     Assessment/Plan: Muscle spasms of neck Rx Robaxin.  Alternate Tylenol and Ibuprofen if needed.  Apply topical Aspercreme and Icy Hot.  Heating pad to area.  Avoid heavy lifting and overexertion.  Activity as tolerated.  Call or return to clinic if symptoms do not continue to improve.

## 2014-01-23 ENCOUNTER — Ambulatory Visit (INDEPENDENT_AMBULATORY_CARE_PROVIDER_SITE_OTHER): Payer: BC Managed Care – PPO | Admitting: Internal Medicine

## 2014-01-23 ENCOUNTER — Encounter: Payer: Self-pay | Admitting: Internal Medicine

## 2014-01-23 VITALS — BP 107/71 | HR 91 | Temp 98.6°F | Wt 197.0 lb

## 2014-01-23 DIAGNOSIS — H60399 Other infective otitis externa, unspecified ear: Secondary | ICD-10-CM

## 2014-01-23 MED ORDER — NEOMYCIN-POLYMYXIN-HC 3.5-10000-1 OT SUSP: 3.0000 [drp] | Freq: Three times a day (TID) | OTIC | Status: DC

## 2014-01-23 NOTE — Progress Notes (Signed)
Subjective:    Patient ID: Helen Young, female    DOB: 15-Aug-1961, 53 y.o.   MRN: [id number]  DOS:  01/23/2014 Type of  visit: Acute visit Few days history of the right year itching and stinging. No discharge or bleeding noted. No recent URI type of symptoms or allergies.    Past Medical History  Diagnosis Date  . HTN (hypertension)   . Environmental allergies 02-27-13    OTC meds daily  . Headache(784.0) 02-27-13    occ. migraines  . Transfusion history     childhood s/p Tonsillectomy    Past Surgical History  Procedure Laterality Date  . Inguinal hernia repair      x's 2   . Knee surgery Left   . Total abdominal hysterectomy w/ bilateral salpingoophorectomy    . Neck surgery      Disc replaced with a steel plate  . Tonsillectomy      childhoood  . Abdominal hysterectomy    . Incisional hernia repair N/A 03/02/2013    Procedure: LAPAROSCOPIC INCISIONAL HERNIA CONVERTED TO OPEN ;  Surgeon: Harl Bowie, MD;  Location: [facility];  Service: General;  Laterality: N/A;  . Cystostomy N/A 03/02/2013    Procedure: CYSTOSTOMY CLOSURE ;  Surgeon: Alexis Frock, MD;  Location: [facility];  Service: Urology;  Laterality: N/A;    History   Social History  . Marital Status: Widowed    Spouse Name: N/A    Number of Children: 3  . Years of Education: N/A   Occupational History  . [employer]    Social History Main Topics  . Smoking status: Former Smoker    Quit date: 09/06/2005  . Smokeless tobacco: Never Used  . Alcohol Use: Yes     Comment: Rarely  . Drug Use: No  . Sexual Activity: Yes   Other Topics Concern  . Not on file   Social History Narrative  . No narrative on file        Medication List       This list is accurate as of: 01/23/14  7:05 PM.  Always use your most recent med list.               amLODipine 5 MG tablet  Commonly known as:  NORVASC  TAKE 1 TABLET BY MOUTH DAILY.     cetirizine 10 MG tablet  Commonly known as:  ZYRTEC    Take 10 mg by mouth at bedtime.     methocarbamol 500 MG tablet  Commonly known as:  ROBAXIN  Take 1 tablet (500 mg total) by mouth 3 (three) times daily.     mometasone 50 MCG/ACT nasal spray  Commonly known as:  NASONEX  Place 2 sprays into the nose daily as needed (for allergies).     neomycin-polymyxin-hydrocortisone 3.5-10000-1 otic suspension  Commonly known as:  CORTISPORIN  Place 3 drops into the right ear 3 (three) times daily. As directed     potassium chloride SA 20 MEQ tablet  Commonly known as:  K-DUR,KLOR-CON  Take 1 tablet (20 mEq total) by mouth once.     PREMARIN 0.625 MG tablet  Generic drug:  estrogens (conjugated)  TAKE ONE TABLET BY MOUTH DAILY     tetrahydrozoline 0.05 % ophthalmic solution  Place 1 drop into both eyes daily as needed (for dry eyes).     valsartan-hydrochlorothiazide 320-25 MG per tablet  Commonly known as:  DIOVAN-HCT  TAKE 1 TABLET BY MOUTH EVERY DAY  Vitamin D3 5000 UNITS Caps  Take 5,000 Units by mouth 2 (two) times a week.           Objective:   Physical Exam BP 107/71  Pulse 91  Temp(Src) 98.6 F (37 C) (Oral)  Wt 197 lb (89.359 kg)  SpO2 97% General -- alert, well-developed, NAD.    HEENT--  Left ear normal Right year with a small amount of whitish debris, it was removed with a spoon, canal skin is slightly red but not swollen, no discharge. Tympanic membrane normal Psych-- Cognition and judgment appear intact. Cooperative with normal attention span and concentration. No anxious or depressed appearing.        Assessment & Plan:   Mild otitis externa? ears cleaned today, will rx  Cortisporin. If not improving she will let me know (fungal otitis?)

## 2014-01-23 NOTE — Progress Notes (Signed)
Pre-visit discussion using our clinic review tool. No additional management support is needed unless otherwise documented below in the visit note.  

## 2014-01-23 NOTE — Patient Instructions (Signed)
Use the eardrops 3 times a day for 5 days, avoid using Q-tips. If no better in one week or if you get worse ---->  please let us know

## 2014-02-28 ENCOUNTER — Other Ambulatory Visit: Payer: Self-pay | Admitting: Family Medicine

## 2014-05-21 ENCOUNTER — Encounter: Payer: Self-pay | Admitting: Family Medicine

## 2014-05-21 ENCOUNTER — Ambulatory Visit (INDEPENDENT_AMBULATORY_CARE_PROVIDER_SITE_OTHER): Payer: BC Managed Care – PPO | Admitting: Family Medicine

## 2014-05-21 VITALS — BP 136/98 | HR 92 | Temp 98.2°F | Wt 198.2 lb

## 2014-05-21 DIAGNOSIS — M542 Cervicalgia: Secondary | ICD-10-CM

## 2014-05-21 MED ORDER — HYDROCODONE-ACETAMINOPHEN 5-300 MG PO TABS: ORAL_TABLET | ORAL | Status: DC

## 2014-05-21 MED ORDER — CYCLOBENZAPRINE HCL 10 MG PO TABS: 10.0000 mg | ORAL_TABLET | Freq: Three times a day (TID) | ORAL | Status: DC | PRN

## 2014-05-21 NOTE — Progress Notes (Signed)
   Subjective:    Patient ID: Helen Young, female    DOB: 02-Sep-1961, 53 y.o.   MRN: [id number]  HPI  pt states her neck started bothering her about 2 weeks.  No known injury.   Review of Systems As above    Objective:   Physical Exam  BP 136/98  Pulse 92  Temp(Src) 98.2 F (36.8 C) (Oral)  Wt 198 lb 3.1 oz (89.9 kg)  SpO2 98% General appearance: alert, cooperative, appears stated age and no distress Neck: no adenopathy, no carotid bruit, no JVD, supple, symmetrical, trachea midline and thyroid not enlarged, symmetric, no tenderness/mass/nodules Neuro  + muscle spasm R trp ,  from      Assessment & Plan:  1. Neck pain  - DG Cervical Spine Complete; Future - cyclobenzaprine (FLEXERIL) 10 MG tablet; Take 1 tablet (10 mg total) by mouth 3 (three) times daily as needed for muscle spasms.  Dispense: 30 tablet; Refill: 0 - Hydrocodone-Acetaminophen 5-300 MG TABS; 1 po q6h prn  Dispense: 60 each; Refill: 0

## 2014-05-21 NOTE — Progress Notes (Signed)
Pre visit review using our clinic review tool, if applicable. No additional management support is needed unless otherwise documented below in the visit note. 

## 2014-05-21 NOTE — Patient Instructions (Signed)

## 2014-05-22 ENCOUNTER — Ambulatory Visit (HOSPITAL_BASED_OUTPATIENT_CLINIC_OR_DEPARTMENT_OTHER)
Admission: RE | Admit: 2014-05-22 | Discharge: 2014-05-22 | Disposition: A | Discharge status: Home / Self Care | Payer: BC Managed Care – PPO | Source: Ambulatory Visit | Attending: Family Medicine | Admitting: Family Medicine

## 2014-05-22 DIAGNOSIS — M542 Cervicalgia: Secondary | ICD-10-CM | POA: Diagnosis present

## 2014-07-24 ENCOUNTER — Encounter: Payer: Self-pay | Admitting: Family

## 2014-07-24 ENCOUNTER — Ambulatory Visit (INDEPENDENT_AMBULATORY_CARE_PROVIDER_SITE_OTHER): Payer: BC Managed Care – PPO | Admitting: Family

## 2014-07-24 VITALS — BP 114/74 | HR 100 | Temp 98.3°F | Resp 16 | Wt 198.4 lb

## 2014-07-24 DIAGNOSIS — H60391 Other infective otitis externa, right ear: Secondary | ICD-10-CM

## 2014-07-24 MED ORDER — NEOMYCIN-POLYMYXIN-HC 3.5-10000-1 OT SUSP: 3.0000 [drp] | Freq: Three times a day (TID) | OTIC | Status: DC

## 2014-07-24 NOTE — Patient Instructions (Signed)
Start cortisporin otic drops for your right ear.  Call if symptoms worsen or if symptoms are not resolved in 1 week.  Otitis Externa Otitis externa is a bacterial or fungal infection of the outer ear canal. This is the area from the eardrum to the outside of the ear. Otitis externa is sometimes called "swimmer's ear." CAUSES  Possible causes of infection include:  Swimming in dirty water.  Moisture remaining in the ear after swimming or bathing.  Mild injury (trauma) to the ear.  Objects stuck in the ear (foreign body).  Cuts or scrapes (abrasions) on the outside of the ear. SIGNS AND SYMPTOMS  The first symptom of infection is often itching in the ear canal. Later signs and symptoms may include swelling and redness of the ear canal, ear pain, and yellowish-white fluid (pus) coming from the ear. The ear pain may be worse when pulling on the earlobe. DIAGNOSIS  Your health care provider will perform a physical exam. A sample of fluid may be taken from the ear and examined for bacteria or fungi. TREATMENT  Antibiotic ear drops are often given for 10 to 14 days. Treatment may also include pain medicine or corticosteroids to reduce itching and swelling. HOME CARE INSTRUCTIONS   Apply antibiotic ear drops to the ear canal as prescribed by your health care provider.  Take medicines only as directed by your health care provider.  If you have diabetes, follow any additional treatment instructions from your health care provider.  Keep all follow-up visits as directed by your health care provider. PREVENTION   Keep your ear dry. Use the corner of a towel to absorb water out of the ear canal after swimming or bathing.  Avoid scratching or putting objects inside your ear. This can damage the ear canal or remove the protective wax that lines the canal. This makes it easier for bacteria and fungi to grow.  Avoid swimming in lakes, polluted water, or poorly chlorinated pools.  You may use  ear drops made of rubbing alcohol and vinegar after swimming. Combine equal parts of white vinegar and alcohol in a bottle. Put 3 or 4 drops into each ear after swimming. SEEK MEDICAL CARE IF:   You have a fever.  Your ear is still red, swollen, painful, or draining pus after 3 days.  Your redness, swelling, or pain gets worse.  You have a severe headache.  You have redness, swelling, pain, or tenderness in the area behind your ear. MAKE SURE YOU:   Understand these instructions.  Will watch your condition.  Will get help right away if you are not doing well or get worse. Document Released: 08/23/2005 Document Revised: 01/07/2014 Document Reviewed: 09/09/2011 [facility] Patient Information 2015 [place], [place]. This information is not intended to replace advice given to you by your health care provider. Make sure you discuss any questions you have with your health care provider.

## 2014-07-24 NOTE — Progress Notes (Signed)
Pre visit review using our clinic review tool, if applicable. No additional management support is needed unless otherwise documented below in the visit note. 

## 2014-07-24 NOTE — Progress Notes (Signed)
Subjective:    Patient ID: Helen Young, female    DOB: 1961-02-18, 53 y.o.   MRN: [id number]  HPI  Helen Young is a 53 yr old female who presents today with chief complaint of right ear drainage. Reports symptoms have been present for several weeks. Denies associate pain.  Denies hearing deficits.     Review of Systems See HPI  Past Medical History  Diagnosis Date  . HTN (hypertension)   . Environmental allergies 02-27-13    OTC meds daily  . Headache(784.0) 02-27-13    occ. migraines  . Transfusion history     childhood s/p Tonsillectomy    History   Social History  . Marital Status: Widowed    Spouse Name: N/A    Number of Children: 3  . Years of Education: N/A   Occupational History  . [employer]    Social History Main Topics  . Smoking status: Former Smoker    Quit date: 09/06/2005  . Smokeless tobacco: Never Used  . Alcohol Use: Yes     Comment: Rarely  . Drug Use: No  . Sexual Activity: Yes   Other Topics Concern  . Not on file   Social History Narrative    Past Surgical History  Procedure Laterality Date  . Inguinal hernia repair      x's 2   . Knee surgery Left   . Total abdominal hysterectomy w/ bilateral salpingoophorectomy    . Neck surgery      Disc replaced with a steel plate  . Tonsillectomy      childhoood  . Abdominal hysterectomy    . Incisional hernia repair N/A 03/02/2013    Procedure: LAPAROSCOPIC INCISIONAL HERNIA CONVERTED TO OPEN ;  Surgeon: Harl Bowie, MD;  Location: [facility];  Service: General;  Laterality: N/A;  . Cystostomy N/A 03/02/2013    Procedure: CYSTOSTOMY CLOSURE ;  Surgeon: Alexis Frock, MD;  Location: [facility];  Service: Urology;  Laterality: N/A;    Family History  Problem Relation Age of Onset  . Colon cancer Father   . Prostate cancer Father   . Heart disease Father   . Hypertension Father   . Heart disease Mother   . Stroke Mother   . Hypertension Mother   . Kidney disease Maternal  Grandfather   . Diabetes Maternal Grandfather   . Sudden death Paternal Grandfather   . Sudden death Paternal Uncle     Cardiac issues    Allergies  Allergen Reactions  . Fish Allergy Itching and Swelling  . Percocet [Oxycodone-Acetaminophen] Swelling    Current Outpatient Prescriptions on File Prior to Visit  Medication Sig Dispense Refill  . amLODipine (NORVASC) 5 MG tablet TAKE 1 TABLET BY MOUTH EVERY DAY 90 tablet 1  . cetirizine (ZYRTEC) 10 MG tablet Take 10 mg by mouth at bedtime.     . Cholecalciferol (VITAMIN D3) 5000 UNITS CAPS Take 5,000 Units by mouth 2 (two) times a week.    . cyclobenzaprine (FLEXERIL) 10 MG tablet Take 1 tablet (10 mg total) by mouth 3 (three) times daily as needed for muscle spasms. 30 tablet 0  . Hydrocodone-Acetaminophen 5-300 MG TABS 1 po q6h prn 60 each 0  . methocarbamol (ROBAXIN) 500 MG tablet Take 1 tablet (500 mg total) by mouth 3 (three) times daily. 90 tablet 0  . mometasone (NASONEX) 50 MCG/ACT nasal spray Place 2 sprays into the nose daily as needed (for allergies).     . potassium  chloride SA (K-DUR,KLOR-CON) 20 MEQ tablet Take 1 tablet (20 mEq total) by mouth once. 30 tablet 2  . PREMARIN 0.625 MG tablet TAKE ONE TABLET BY MOUTH DAILY 30 tablet 5  . tetrahydrozoline 0.05 % ophthalmic solution Place 1 drop into both eyes daily as needed (for dry eyes).    . valsartan-hydrochlorothiazide (DIOVAN-HCT) 320-25 MG per tablet TAKE 1 TABLET BY MOUTH EVERY DAY 90 tablet 1   No current facility-administered medications on file prior to visit.    BP 114/74 mmHg  Pulse 100  Temp(Src) 98.3 F (36.8 C) (Oral)  Resp 16  Wt 198 lb 6.4 oz (89.994 kg)  SpO2 100%        Objective:   Physical Exam  Constitutional: She appears well-developed and well-nourished. No distress.  HENT:  Head: Normocephalic and atraumatic.  Right Ear: Tympanic membrane normal.  Left Ear: Tympanic membrane and ear canal normal.  Dry skin noted near external ear canal.  Exudate noted in ear canal. No swelling.           Assessment & Plan:

## 2014-07-24 NOTE — Assessment & Plan Note (Signed)
Will rx with cortisporin otic. Follow up if symptoms worsen or if symptoms do not improve. Flu shot today.

## 2014-08-25 ENCOUNTER — Other Ambulatory Visit: Payer: Self-pay | Admitting: Family Medicine

## 2014-10-04 ENCOUNTER — Ambulatory Visit (HOSPITAL_BASED_OUTPATIENT_CLINIC_OR_DEPARTMENT_OTHER)
Admission: RE | Admit: 2014-10-04 | Discharge: 2014-10-04 | Disposition: A | Discharge status: Home / Self Care | Payer: BC Managed Care – PPO | Source: Ambulatory Visit | Attending: Family Medicine | Admitting: Family Medicine

## 2014-10-04 ENCOUNTER — Ambulatory Visit (INDEPENDENT_AMBULATORY_CARE_PROVIDER_SITE_OTHER): Payer: BC Managed Care – PPO | Admitting: Family Medicine

## 2014-10-04 VITALS — BP 136/88 | HR 88 | Temp 98.8°F | Wt 203.8 lb

## 2014-10-04 DIAGNOSIS — M79644 Pain in right finger(s): Secondary | ICD-10-CM | POA: Insufficient documentation

## 2014-10-04 DIAGNOSIS — W19XXXA Unspecified fall, initial encounter: Secondary | ICD-10-CM | POA: Insufficient documentation

## 2014-10-04 DIAGNOSIS — M25531 Pain in right wrist: Secondary | ICD-10-CM | POA: Diagnosis not present

## 2014-10-04 NOTE — Progress Notes (Signed)
Pre visit review using our clinic review tool, if applicable. No additional management support is needed unless otherwise documented below in the visit note. 

## 2014-10-04 NOTE — Patient Instructions (Signed)

## 2014-10-04 NOTE — Progress Notes (Signed)
  Subjective:    Helen Young is an 54 y.o. female who presents for evaluation of right wrist pain. Onset was sudden, related to a fall from standing. Mechanism of injury: fall. The pain is moderate, worsens with movement, and is relieved by rest. There is no associated numbness, tingling, weakness in r hand. There is history of injury. Evaluation to date: none. Treatment to date: OTC analgesics.  The following portions of the patient's history were reviewed and updated as appropriate: allergies, current medications, past family history, past medical history, past social history, past surgical history and problem list.  Review of Systems Pertinent items are noted in HPI.   Objective:    BP 136/88 mmHg  Pulse 88  Temp(Src) 98.8 F (37.1 C) (Oral)  Wt 203 lb 12.8 oz (92.443 kg)  SpO2 95% Right wrist:  soft tissue tenderness and swelling at the base of thenar emminence  Left wrist:  normal exam, no swelling, tenderness, instability; ligaments intact, full ROM both hands, wrists, and finger joints   Imaging: X-ray right wrist: not available   Assessment:    Wrist strain on the right side   Plan:    Resr, ice, compression, and elevation (RICE) therapy. Reduction in offending activity discussed. OTC analgesics as needed. thumb spica splint--rto prn

## 2014-10-05 ENCOUNTER — Encounter: Payer: Self-pay | Admitting: Family Medicine

## 2014-10-07 ENCOUNTER — Encounter: Payer: Self-pay | Admitting: Family Medicine

## 2014-10-10 ENCOUNTER — Other Ambulatory Visit: Payer: Self-pay

## 2014-10-10 DIAGNOSIS — M25531 Pain in right wrist: Secondary | ICD-10-CM

## 2014-10-10 NOTE — Telephone Encounter (Signed)
We can refer her to [facility]

## 2014-10-14 ENCOUNTER — Encounter: Payer: Self-pay | Admitting: Family Medicine

## 2014-10-14 ENCOUNTER — Ambulatory Visit (HOSPITAL_BASED_OUTPATIENT_CLINIC_OR_DEPARTMENT_OTHER)
Admission: RE | Admit: 2014-10-14 | Discharge: 2014-10-14 | Disposition: A | Discharge status: Home / Self Care | Payer: BC Managed Care – PPO | Source: Ambulatory Visit | Attending: Family Medicine | Admitting: Family Medicine

## 2014-10-14 ENCOUNTER — Ambulatory Visit (INDEPENDENT_AMBULATORY_CARE_PROVIDER_SITE_OTHER): Payer: BC Managed Care – PPO | Admitting: Family Medicine

## 2014-10-14 VITALS — BP 138/95 | HR 87 | Ht 65.0 in | Wt 203.0 lb

## 2014-10-14 DIAGNOSIS — S6991XA Unspecified injury of right wrist, hand and finger(s), initial encounter: Secondary | ICD-10-CM

## 2014-10-14 DIAGNOSIS — M25531 Pain in right wrist: Secondary | ICD-10-CM | POA: Diagnosis present

## 2014-10-14 DIAGNOSIS — W19XXXD Unspecified fall, subsequent encounter: Secondary | ICD-10-CM | POA: Insufficient documentation

## 2014-10-14 NOTE — Patient Instructions (Signed)
You have a wrist sprain and contusion. Wear the wrist brace for a total of 4 weeks from the injury (can wear it longer if you're still having pain. Icing 15 minutes at a time 3-4 times a day. Ibuprofen 600mg  three times a day with food OR aleve 2 tabs twice a day with food as needed for pain and inflammation. Expect the sprain portion of this to resolve within 4 weeks but the bone bruising here can take a few months to completely resolve. Follow up with me in 4 weeks if you're struggling.

## 2014-10-15 NOTE — Progress Notes (Signed)
PCP and referred by: Garnet Koyanagi, DO  Subjective:   HPI: Patient is a 54 y.o. female here for right wrist injury.  Patient reoprts on 1/22 she was putting up wall decals when she tripped and fell, sustaining [place] injury to right wrist. Pain radial side of volar wrist. Is right handed. Pain with lifting items. Slight swelling. Radiographs negative for fracture. Been wearing a wrist brace since then. Achy 5/10 level of pain  Past Medical History  Diagnosis Date  . HTN (hypertension)   . Environmental allergies 02-27-13    OTC meds daily  . Headache(784.0) 02-27-13    occ. migraines  . Transfusion history     childhood s/p Tonsillectomy    Current Outpatient Prescriptions on File Prior to Visit  Medication Sig Dispense Refill  . amLODipine (NORVASC) 5 MG tablet TAKE 1 TABLET BY MOUTH EVERY DAY 30 tablet 5  . cetirizine (ZYRTEC) 10 MG tablet Take 10 mg by mouth at bedtime.     . Cholecalciferol (VITAMIN D3) 5000 UNITS CAPS Take 5,000 Units by mouth 2 (two) times a week.    . methocarbamol (ROBAXIN) 500 MG tablet Take 1 tablet (500 mg total) by mouth 3 (three) times daily. 90 tablet 0  . mometasone (NASONEX) 50 MCG/ACT nasal spray Place 2 sprays into the nose daily as needed (for allergies).     . potassium chloride SA (K-DUR,KLOR-CON) 20 MEQ tablet Take 1 tablet (20 mEq total) by mouth once. 30 tablet 2  . PREMARIN 0.625 MG tablet TAKE ONE TABLET BY MOUTH DAILY 30 tablet 5  . tetrahydrozoline 0.05 % ophthalmic solution Place 1 drop into both eyes daily as needed (for dry eyes).    . valsartan-hydrochlorothiazide (DIOVAN-HCT) 320-25 MG per tablet TAKE 1 TABLET BY MOUTH EVERY DAY 30 tablet 5   No current facility-administered medications on file prior to visit.    Past Surgical History  Procedure Laterality Date  . Inguinal hernia repair      x's 2   . Knee surgery Left   . Total abdominal hysterectomy w/ bilateral salpingoophorectomy    . Neck surgery      Disc replaced  with a steel plate  . Tonsillectomy      childhoood  . Abdominal hysterectomy    . Incisional hernia repair N/A 03/02/2013    Procedure: LAPAROSCOPIC INCISIONAL HERNIA CONVERTED TO OPEN ;  Surgeon: Harl Bowie, MD;  Location: [facility];  Service: General;  Laterality: N/A;  . Cystostomy N/A 03/02/2013    Procedure: CYSTOSTOMY CLOSURE ;  Surgeon: Alexis Frock, MD;  Location: [facility];  Service: Urology;  Laterality: N/A;    Allergies  Allergen Reactions  . Fish Allergy Itching and Swelling  . Percocet [Oxycodone-Acetaminophen] Swelling    History   Social History  . Marital Status: Widowed    Spouse Name: N/A    Number of Children: 3  . Years of Education: N/A   Occupational History  . [employer]    Social History Main Topics  . Smoking status: Former Smoker    Quit date: 09/06/2005  . Smokeless tobacco: Never Used  . Alcohol Use: 0.0 oz/week    0 Not specified per week     Comment: Rarely  . Drug Use: No  . Sexual Activity: Yes   Other Topics Concern  . Not on file   Social History Narrative    Family History  Problem Relation Age of Onset  . Colon cancer Father   . Prostate cancer  Father   . Heart disease Father   . Hypertension Father   . Heart disease Mother   . Stroke Mother   . Hypertension Mother   . Kidney disease Maternal Grandfather   . Diabetes Maternal Grandfather   . Sudden death Paternal Grandfather   . Sudden death Paternal Uncle     Cardiac issues    BP 138/95 mmHg  Pulse 87  Ht 5\' 5"  (1.651 m)  Wt 203 lb (92.08 kg)  BMI 33.78 kg/m2  Review of Systems: See HPI above.    Objective:  Physical Exam:  Gen: NAD  Right wrist: No gross deformity, swelling, bruising. TTP near base of thumb volar area.  No snuffbox, radius, ulna, other tenderness. FROM wrist and all digits. NVI distally.    Assessment & Plan:  1. Right wrist injury - radiographs including today's carpal tunnel view negative for fracture - 2/2 contusion  and sprain.  Wrist brace for support, icing, nsaids.  F/u in 4 weeks if still struggling.

## 2014-10-16 ENCOUNTER — Ambulatory Visit: Payer: BC Managed Care – PPO | Admitting: Family Medicine

## 2014-10-16 ENCOUNTER — Encounter: Payer: Self-pay | Admitting: Family Medicine

## 2014-10-16 NOTE — Assessment & Plan Note (Signed)
radiographs including today's carpal tunnel view negative for fracture - 2/2 contusion and sprain.  Wrist brace for support, icing, nsaids.  F/u in 4 weeks if still struggling.

## 2014-10-28 ENCOUNTER — Encounter: Payer: Self-pay | Admitting: *Deleted

## 2014-11-15 ENCOUNTER — Encounter: Payer: Self-pay | Admitting: Family Medicine

## 2014-11-15 ENCOUNTER — Ambulatory Visit (INDEPENDENT_AMBULATORY_CARE_PROVIDER_SITE_OTHER): Payer: BC Managed Care – PPO | Admitting: Medical

## 2014-11-15 ENCOUNTER — Encounter: Payer: Self-pay | Admitting: Medical

## 2014-11-15 VITALS — BP 135/90 | HR 80 | Temp 98.4°F | Ht 65.0 in | Wt 292.0 lb

## 2014-11-15 DIAGNOSIS — I10 Essential (primary) hypertension: Secondary | ICD-10-CM

## 2014-11-15 LAB — CBC WITH DIFFERENTIAL/PLATELET
Basophils Absolute: 0.1 10*3/uL (ref 0.0–0.1)
Basophils Relative: 1 % (ref 0–1)
EOS ABS: 0.1 10*3/uL (ref 0.0–0.7)
EOS PCT: 2 % (ref 0–5)
HCT: 41.2 % (ref 36.0–46.0)
Hemoglobin: 13.7 g/dL (ref 12.0–15.0)
Lymphocytes Relative: 48 % — ABNORMAL HIGH (ref 12–46)
Lymphs Abs: 3.1 10*3/uL (ref 0.7–4.0)
MCH: 29.6 pg (ref 26.0–34.0)
MCHC: 33.3 g/dL (ref 30.0–36.0)
MCV: 89 fL (ref 78.0–100.0)
MONO ABS: 0.5 10*3/uL (ref 0.1–1.0)
MPV: 9 fL (ref 8.6–12.4)
Monocytes Relative: 8 % (ref 3–12)
NEUTROS ABS: 2.7 10*3/uL (ref 1.7–7.7)
Neutrophils Relative %: 41 % — ABNORMAL LOW (ref 43–77)
PLATELETS: 383 10*3/uL (ref 150–400)
RBC: 4.63 MIL/uL (ref 3.87–5.11)
RDW: 12.7 % (ref 11.5–15.5)
WBC: 6.5 10*3/uL (ref 4.0–10.5)

## 2014-11-15 LAB — COMPREHENSIVE METABOLIC PANEL
ALBUMIN: 4.5 g/dL (ref 3.5–5.2)
ALT: 11 U/L (ref 0–35)
AST: 15 U/L (ref 0–37)
Alkaline Phosphatase: 51 U/L (ref 39–117)
BUN: 10 mg/dL (ref 6–23)
CALCIUM: 10.2 mg/dL (ref 8.4–10.5)
CHLORIDE: 99 meq/L (ref 96–112)
CO2: 28 meq/L (ref 19–32)
Creat: 0.86 mg/dL (ref 0.50–1.10)
GLUCOSE: 75 mg/dL (ref 70–99)
POTASSIUM: 4.4 meq/L (ref 3.5–5.3)
Sodium: 140 mEq/L (ref 135–145)
Total Bilirubin: 0.5 mg/dL (ref 0.2–1.2)
Total Protein: 7.6 g/dL (ref 6.0–8.3)

## 2014-11-15 NOTE — Patient Instructions (Signed)
HTN (hypertension) Your bp readings are better today than they were yesteday. Stress may be playing role in recent increase. I would check you bp daily after your take your bp med in am. If you bp is above 140/90 then increase amlodipine to 10 mg every day. Continue diovan hct same dose.  If you do increase amlodipine to 10 mg then please make appointment in 2 wks for bp check.  If you get any neurologic or cardiac signs or symptoms then ED evaluation.  With your recent flushed feeling and use of diurectic component in one of bp meds. I do want to do labs today and check your k, na levels.

## 2014-11-15 NOTE — Progress Notes (Signed)
Subjective:    Patient ID: Helen Young, female    DOB: 12-28-1960, 54 y.o.   MRN: [id number]  HPI   Pt in for reporting wed bp 155/103. Pt states various checks that day about same. Yesterday 147/98. Today diastolic over 660. Then today 147/95. Pt has been diovan/hct and amlodipine. No recent dose changes.   Pt on Sunday felt flushed very briefly. Took a nap and felt better. Wed briefly the same. Yesterday felt well. No reported cardiac or neurologic signs or symptoms.  Pt states some moderate stress. 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 Has to have it done by Monday am.     Review of Systems  Constitutional: Negative for fever, chills, diaphoresis, activity change and fatigue.       Flushed feeling transiently Tuesday and wed.  Respiratory: Negative for cough, chest tightness and shortness of breath.   Cardiovascular: Negative for chest pain, palpitations and leg swelling.  Gastrointestinal: Negative for nausea, vomiting and abdominal pain.  Musculoskeletal: Negative for neck pain and neck stiffness.  Neurological: Negative for dizziness, tremors, seizures, syncope, facial asymmetry, speech difficulty, weakness, light-headedness, numbness and headaches.  Psychiatric/Behavioral: Negative for behavioral problems, confusion and agitation. The patient is not nervous/anxious.    Past Medical History  Diagnosis Date  . HTN (hypertension)   . Environmental allergies 02-27-13    OTC meds daily  . Headache(784.0) 02-27-13    occ. migraines  . Transfusion history     childhood s/p Tonsillectomy    History   Social History  . Marital Status: Widowed    Spouse Name: N/A  . Number of Children: 3  . Years of Education: N/A   Occupational History  . [employer]    Social History Main Topics  . Smoking status: Former Smoker    Quit date: 09/06/2005  . Smokeless tobacco: Never Used  . Alcohol Use: 0.0 oz/week    0 Standard drinks or equivalent per week   Comment: Rarely  . Drug Use: No  . Sexual Activity: Yes   Other Topics Concern  . Not on file   Social History Narrative    Past Surgical History  Procedure Laterality Date  . Inguinal hernia repair      x's 2   . Knee surgery Left   . Total abdominal hysterectomy w/ bilateral salpingoophorectomy    . Neck surgery      Disc replaced with a steel plate  . Tonsillectomy      childhoood  . Abdominal hysterectomy    . Incisional hernia repair N/A 03/02/2013    Procedure: LAPAROSCOPIC INCISIONAL HERNIA CONVERTED TO OPEN ;  Surgeon: Harl Bowie, MD;  Location: [facility];  Service: General;  Laterality: N/A;  . Cystostomy N/A 03/02/2013    Procedure: CYSTOSTOMY CLOSURE ;  Surgeon: Alexis Frock, MD;  Location: [facility];  Service: Urology;  Laterality: N/A;    Family History  Problem Relation Age of Onset  . Colon cancer Father   . Prostate cancer Father   . Heart disease Father   . Hypertension Father   . Heart disease Mother   . Stroke Mother   . Hypertension Mother   . Kidney disease Maternal Grandfather   . Diabetes Maternal Grandfather   . Sudden death Paternal Grandfather   . Sudden death Paternal Uncle     Cardiac issues    Allergies  Allergen Reactions  . Fish Allergy Itching and Swelling  . Percocet [Oxycodone-Acetaminophen] Swelling  Current Outpatient Prescriptions on File Prior to Visit  Medication Sig Dispense Refill  . amLODipine (NORVASC) 5 MG tablet TAKE 1 TABLET BY MOUTH EVERY DAY 30 tablet 5  . cetirizine (ZYRTEC) 10 MG tablet Take 10 mg by mouth at bedtime.     . Cholecalciferol (VITAMIN D3) 5000 UNITS CAPS Take 5,000 Units by mouth 2 (two) times a week.    . methocarbamol (ROBAXIN) 500 MG tablet Take 1 tablet (500 mg total) by mouth 3 (three) times daily. 90 tablet 0  . mometasone (NASONEX) 50 MCG/ACT nasal spray Place 2 sprays into the nose daily as needed (for allergies).     . potassium chloride SA (K-DUR,KLOR-CON) 20 MEQ tablet Take 1 tablet  (20 mEq total) by mouth once. 30 tablet 2  . PREMARIN 0.625 MG tablet TAKE ONE TABLET BY MOUTH DAILY 30 tablet 5  . tetrahydrozoline 0.05 % ophthalmic solution Place 1 drop into both eyes daily as needed (for dry eyes).    . valsartan-hydrochlorothiazide (DIOVAN-HCT) 320-25 MG per tablet TAKE 1 TABLET BY MOUTH EVERY DAY 30 tablet 5   No current facility-administered medications on file prior to visit.    BP 135/90 mmHg  Pulse 80  Temp(Src) 98.4 F (36.9 C) (Oral)  Ht 5\' 5"  (1.651 m)  Wt 292 lb (132.45 kg)  BMI 48.59 kg/m2  SpO2 100%      Objective:   Physical Exam  General Mental Status- Alert. General Appearance- Not in acute distress.   Skin General: Color- Normal Color. Moisture- Normal Moisture.  Neck Carotid Arteries- Normal color. Moisture- Normal Moisture. No carotid bruits. No JVD.  Chest and Lung Exam Auscultation: Breath Sounds:-Normal.  Cardiovascular Auscultation:Rythm- Regular, rate and rhythm. Murmurs & Other Heart Sounds:Auscultation of the heart reveals- No Murmurs.   Neurologic Cranial Nerve exam:- CN III-XII intact(No nystagmus), symmetric smile. Drift Test:- No drift. Romberg Exam:- Negative.  Heal to Toe Gait exam:-Normal. Finger to Nose:- Normal/Intact Strength:- 5/5 equal and symmetric strength both upper and lower extremities.      Assessment & Plan:

## 2014-11-15 NOTE — Progress Notes (Signed)
Pre visit review using our clinic review tool, if applicable. No additional management support is needed unless otherwise documented below in the visit note. 

## 2014-11-15 NOTE — Assessment & Plan Note (Signed)
Your bp readings are better today than they were yesteday. Stress may be playing role in recent increase. I would check you bp daily after your take your bp med in am. If you bp is above 140/90 then increase amlodipine to 10 mg every day. Continue diovan hct same dose.  If you do increase amlodipine to 10 mg then please make appointment in 2 wks for bp check.  If you get any neurologic or cardiac signs or symptoms then ED evaluation.  With your recent flushed feeling and use of diurectic component in one of bp meds. I do want to do labs today and check your k, na levels.

## 2014-11-15 NOTE — Telephone Encounter (Signed)
C/o:  Elevated blood pressure (see email), dull, nagging headaches, slight dizziness, nausea with no vomiting, and excessive fatigue.  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   Advice: Appointment today with Mackie Pai, PA-C at 2:15 pm.

## 2014-12-02 ENCOUNTER — Ambulatory Visit: Payer: BC Managed Care – PPO | Admitting: Medical

## 2014-12-06 ENCOUNTER — Encounter: Payer: Self-pay | Admitting: Medical

## 2014-12-06 ENCOUNTER — Ambulatory Visit (INDEPENDENT_AMBULATORY_CARE_PROVIDER_SITE_OTHER): Payer: BC Managed Care – PPO | Admitting: Medical

## 2014-12-06 VITALS — BP 135/99 | HR 92 | Temp 98.1°F | Ht 65.0 in | Wt 201.4 lb

## 2014-12-06 DIAGNOSIS — M5441 Lumbago with sciatica, right side: Secondary | ICD-10-CM | POA: Diagnosis not present

## 2014-12-06 DIAGNOSIS — I10 Essential (primary) hypertension: Secondary | ICD-10-CM

## 2014-12-06 DIAGNOSIS — M5442 Lumbago with sciatica, left side: Secondary | ICD-10-CM | POA: Diagnosis not present

## 2014-12-06 DIAGNOSIS — M549 Dorsalgia, unspecified: Secondary | ICD-10-CM | POA: Insufficient documentation

## 2014-12-06 MED ORDER — DICLOFENAC SODIUM 75 MG PO TBEC: 75.0000 mg | DELAYED_RELEASE_TABLET | Freq: Two times a day (BID) | ORAL | Status: DC

## 2014-12-06 MED ORDER — CYCLOBENZAPRINE HCL 5 MG PO TABS: 5.0000 mg | ORAL_TABLET | Freq: Every day | ORAL | Status: DC

## 2014-12-06 MED ORDER — AMLODIPINE BESYLATE 5 MG PO TABS: 5.0000 mg | ORAL_TABLET | Freq: Every day | ORAL | Status: DC

## 2014-12-06 NOTE — Progress Notes (Signed)
Pre visit review using our clinic review tool, if applicable. No additional management support is needed unless otherwise documented below in the visit note. 

## 2014-12-06 NOTE — Assessment & Plan Note (Signed)
Lower lumbar pain likely related to scoliosis. Rx diclofenac for pain if were to reoccur. And low dose flexeril if needed for back spasms. If back pain frequently reoccurring then l-s spine.

## 2014-12-06 NOTE — Patient Instructions (Signed)
HTN (hypertension) Fair bp when I checked today myself. Your daily reading around l120/70-80 are very good.  This was on the amlodpine 5 mg and your current dose of diovan. Continue this  regimen.  But also check bp every day. If trend/average over 140/90 would recommend 10 mg norvasc Reduced risk of stroke or MI. Any neurologic signs or cardiac signs or symptoms advise ED.    Back pain Lower lumbar pain likely related to scoliosis. Rx diclofenac for pain if were to reoccur. And low dose flexeril if needed for back spasms. If back pain frequently reoccurring then l-s spine.     Follow up in 2-3 months pcp or as needed.

## 2014-12-06 NOTE — Assessment & Plan Note (Addendum)
Fair bp when I checked today myself. Your daily reading around  Are about  120/70-80 are very good.  This was on the amlodpine 5 mg and your current dose of diovan. Continue this  regimen.  But also check bp every day. If trend/average over 140/90 would recommend 10 mg norvasc. Tight control of bp will reduce  risk of stroke or MI. Any neurologic signs or cardiac signs or symptoms advise ED.

## 2014-12-06 NOTE — Progress Notes (Signed)
Subjective:    Patient ID: Helen Young, female    DOB: [birth year], 54 y.o.   MRN: [id number]  HPI  Pt states since I last saw her she has been checking bp. 119/71 and  120/79. Other readings have fallen in this range as well. Checking one time a day in am. Pt initial bp when came in was high today.. She got stuck in traffic jam and was worried about missing appointment.  Pt states that she used 10 mg norvasc only briefly for 4 days. But then after finishing the grant she went back to 5 mg and her bp for 3 wks have been controlled.  Pt has some scoliosis. Hx of some occasional lower back pain. Hx of some back pain when she stands on feet long time. Pain recently mild and occuring past 3 weeks. Today not a bad day. Yesterday was cooking a cake and standing and had some prominent low back pain. But resolved now. No pain shooting to leg today. But then states 2 days ago had some transient pain that ran down leg. But rare and not constant. No saddle anesthesia. No bladder dysfunction.      Review of Systems  Constitutional: Negative for fever, chills, diaphoresis, activity change and fatigue.  Respiratory: Negative for cough, chest tightness, shortness of breath and wheezing.   Cardiovascular: Negative for chest pain, palpitations and leg swelling.  Gastrointestinal: Negative for nausea, vomiting and abdominal pain.  Genitourinary: Negative for dysuria, urgency, hematuria and flank pain.  Musculoskeletal: Positive for back pain. Negative for neck pain and neck stiffness.  Neurological: Negative for dizziness, tremors, seizures, syncope, facial asymmetry, speech difficulty, weakness, light-headedness, numbness and headaches.  Hematological: Negative for adenopathy. Does not bruise/bleed easily.  Psychiatric/Behavioral: Negative for behavioral problems, confusion and agitation. The patient is not nervous/anxious.    Past Medical History  Diagnosis Date  . HTN (hypertension)   .  Environmental allergies 02-27-13    OTC meds daily  . Headache(784.0) 02-27-13    occ. migraines  . Transfusion history     childhood s/p Tonsillectomy    History   Social History  . Marital Status: Widowed    Spouse Name: N/A  . Number of Children: 3  . Years of Education: N/A   Occupational History  . [employer]    Social History Main Topics  . Smoking status: Former Smoker    Quit date: 09/06/2005  . Smokeless tobacco: Never Used  . Alcohol Use: 0.0 oz/week    0 Standard drinks or equivalent per week     Comment: Rarely  . Drug Use: No  . Sexual Activity: Yes   Other Topics Concern  . Not on file   Social History Narrative    Past Surgical History  Procedure Laterality Date  . Inguinal hernia repair      x's 2   . Knee surgery Left   . Total abdominal hysterectomy w/ bilateral salpingoophorectomy    . Neck surgery      Disc replaced with a steel plate  . Tonsillectomy      childhoood  . Abdominal hysterectomy    . Incisional hernia repair N/A 03/02/2013    Procedure: LAPAROSCOPIC INCISIONAL HERNIA CONVERTED TO OPEN ;  Surgeon: Harl Bowie, MD;  Location: [facility];  Service: General;  Laterality: N/A;  . Cystostomy N/A 03/02/2013    Procedure: CYSTOSTOMY CLOSURE ;  Surgeon: Alexis Frock, MD;  Location: [facility];  Service: Urology;  Laterality: N/A;  Family History  Problem Relation Age of Onset  . Colon cancer Father   . Prostate cancer Father   . Heart disease Father   . Hypertension Father   . Heart disease Mother   . Stroke Mother   . Hypertension Mother   . Kidney disease Maternal Grandfather   . Diabetes Maternal Grandfather   . Sudden death Paternal Grandfather   . Sudden death Paternal Uncle     Cardiac issues    Allergies  Allergen Reactions  . Fish Allergy Itching and Swelling  . Percocet [Oxycodone-Acetaminophen] Swelling    Current Outpatient Prescriptions on File Prior to Visit  Medication Sig Dispense Refill  .  cetirizine (ZYRTEC) 10 MG tablet Take 10 mg by mouth at bedtime.     . Cholecalciferol (VITAMIN D3) 5000 UNITS CAPS Take 5,000 Units by mouth 2 (two) times a week.    . methocarbamol (ROBAXIN) 500 MG tablet Take 1 tablet (500 mg total) by mouth 3 (three) times daily. 90 tablet 0  . mometasone (NASONEX) 50 MCG/ACT nasal spray Place 2 sprays into the nose daily as needed (for allergies).     . potassium chloride SA (K-DUR,KLOR-CON) 20 MEQ tablet Take 1 tablet (20 mEq total) by mouth once. 30 tablet 2  . PREMARIN 0.625 MG tablet TAKE ONE TABLET BY MOUTH DAILY 30 tablet 5  . tetrahydrozoline 0.05 % ophthalmic solution Place 1 drop into both eyes daily as needed (for dry eyes).    . valsartan-hydrochlorothiazide (DIOVAN-HCT) 320-25 MG per tablet TAKE 1 TABLET BY MOUTH EVERY DAY 30 tablet 5   No current facility-administered medications on file prior to visit.    BP 135/99 mmHg  Pulse 92  Temp(Src) 98.1 F (36.7 C) (Oral)  Ht 5\' 5"  (1.651 m)  Wt 201 lb 6.4 oz (91.354 kg)  BMI 33.51 kg/m2  SpO2 97%       Objective:   Physical Exam   General Mental Status- Alert. General Appearance- Not in acute distress.   Skin General: Color- Normal Color. Moisture- Normal Moisture.  Neck Carotid Arteries- Normal color. Moisture- Normal Moisture. No carotid bruits. No JVD.  Chest and Lung Exam Auscultation: Breath Sounds:-Normal.  Cardiovascular Auscultation:Rythm- Regular. Murmurs & Other Heart Sounds:Auscultation of the heart reveals- No Murmurs.  Abdomen Inspection:-Inspeection Normal. Palpation/Percussion:Note:No mass. Palpation and Percussion of the abdomen reveal- Non Tender, Non Distended + BS, no rebound or guarding.    Neurologic Cranial Nerve exam:- CN III-XII intact(No nystagmus), symmetric smile. Finger to Nose:- Normal/Intact Strength:- 5/5 equal and symmetric strength both upper and lower extremities.    Back Mid lumbar spine tenderness to palpation mild Faint pain  on straight leg lift. No pain on lateral movements and flexion/extension of the spine.  Lower ext neurologic  L5-S1 sensation intact bilaterally. Normal patellar reflexes bilaterally. No foot drop bilaterally.       Assessment & Plan:

## 2015-05-08 ENCOUNTER — Other Ambulatory Visit: Payer: Self-pay | Admitting: Family Medicine

## 2015-07-08 ENCOUNTER — Telehealth: Payer: Self-pay | Admitting: Family Medicine

## 2015-07-08 NOTE — Telephone Encounter (Signed)
Patient has moved and requests to xfer to [place] location due to geographical ease of access. Is this something that you are ok with Dr Etter Sjogren? Dr Quay Burow?

## 2015-07-08 NOTE — Telephone Encounter (Signed)
Ok with me 

## 2015-07-08 NOTE — Telephone Encounter (Signed)
im fine with that

## 2015-07-16 ENCOUNTER — Ambulatory Visit (INDEPENDENT_AMBULATORY_CARE_PROVIDER_SITE_OTHER): Payer: BC Managed Care – PPO | Admitting: Internal Medicine

## 2015-07-16 ENCOUNTER — Encounter: Payer: Self-pay | Admitting: Internal Medicine

## 2015-07-16 VITALS — BP 120/84 | HR 88 | Temp 98.6°F | Resp 16 | Wt 193.0 lb

## 2015-07-16 DIAGNOSIS — R1030 Lower abdominal pain, unspecified: Secondary | ICD-10-CM | POA: Insufficient documentation

## 2015-07-16 DIAGNOSIS — R1032 Left lower quadrant pain: Secondary | ICD-10-CM | POA: Diagnosis not present

## 2015-07-16 DIAGNOSIS — Z23 Encounter for immunization: Secondary | ICD-10-CM | POA: Diagnosis not present

## 2015-07-16 DIAGNOSIS — I10 Essential (primary) hypertension: Secondary | ICD-10-CM | POA: Diagnosis not present

## 2015-07-16 DIAGNOSIS — M545 Low back pain, unspecified: Secondary | ICD-10-CM

## 2015-07-16 DIAGNOSIS — R200 Anesthesia of skin: Secondary | ICD-10-CM | POA: Insufficient documentation

## 2015-07-16 DIAGNOSIS — R208 Other disturbances of skin sensation: Secondary | ICD-10-CM

## 2015-07-16 NOTE — Progress Notes (Signed)
Subjective:    Patient ID: Helen Young, female    DOB: 11/19/1960, 54 y.o.   MRN: [id number]  HPI She is here to establish with a new pcp. She has concerns regarding left groin pain and left leg numbness.  Hypertension: She is taking her medication daily. She is compliant with a low sodium diet.  She denies chest pain, palpitations, edema, shortness of breath and regular headaches. She is exercising regularly.  She does monitor her blood pressure at home.    Left calf numbness:  This is intermittent and started in the past year.  It is coming more often recently.  There is no pattern to when the numbness comes - not related to activity or back pain.   It occasionally radiates into the foot. She denies pain and weakness in the leg.  She has the numbness 3-4 times a week.     Scoliosis, back pain, s/p cervical fusion, cervical DDD, lumbar DDD:  She gets a catch in her lower back and it feels like it will give way if she stands for a long period of time.  Rest usually resolves the pain.  She used to have chronic daily LBP, but that improved with exercising reguarly.  She denies any daily symptoms now.    Right groin knot:  She has a history of inguinal hernia on the left side and has had three repairs in the past.  She has some discomfort in the left groin at times with walking and standing.  She has had multiple abdominal surgeries in the past.    Medications and allergies reviewed with patient and updated if appropriate.  Patient Active Problem List   Diagnosis Date Noted  . Back pain 12/06/2014  . Right wrist injury 10/16/2014  . Muscle spasms of neck 12/10/2013  . Obesity (BMI 30-39.9) 10/15/2013  . Enteritis post op 03/10/2013  . Recurrent ventral incisional hernia 02/09/2013  . Thoracic back pain 10/09/2012  . HTN (hypertension) 04/06/2012  . Scoliosis 04/06/2012  . Environmental allergies 04/06/2012  . Postmenopausal 04/06/2012    Current Outpatient Prescriptions on File  Prior to Visit  Medication Sig Dispense Refill  . amLODipine (NORVASC) 5 MG tablet Take 1 tablet (5 mg total) by mouth daily. 30 tablet 5  . cetirizine (ZYRTEC) 10 MG tablet Take 10 mg by mouth at bedtime.     . mometasone (NASONEX) 50 MCG/ACT nasal spray Place 2 sprays into the nose daily as needed (for allergies).     Marland Kitchen PREMARIN 0.625 MG tablet TAKE ONE TABLET BY MOUTH DAILY 30 tablet 5  . tetrahydrozoline 0.05 % ophthalmic solution Place 1 drop into both eyes daily as needed (for dry eyes).    . valsartan-hydrochlorothiazide (DIOVAN-HCT) 320-25 MG per tablet Take 1 tablet by mouth daily. Office visit due now 90 tablet 0   No current facility-administered medications on file prior to visit.    Past Medical History  Diagnosis Date  . HTN (hypertension)   . Environmental allergies 02-27-13    OTC meds daily  . Headache(784.0) 02-27-13    occ. migraines  . Transfusion history     childhood s/p Tonsillectomy    Past Surgical History  Procedure Laterality Date  . Inguinal hernia repair      x's 2   . Knee surgery Left   . Total abdominal hysterectomy w/ bilateral salpingoophorectomy    . Neck surgery      Disc replaced with a steel plate  . Tonsillectomy  childhoood  . Abdominal hysterectomy    . Incisional hernia repair N/A 03/02/2013    Procedure: LAPAROSCOPIC INCISIONAL HERNIA CONVERTED TO OPEN ;  Surgeon: Harl Bowie, MD;  Location: [facility];  Service: General;  Laterality: N/A;  . Cystostomy N/A 03/02/2013    Procedure: CYSTOSTOMY CLOSURE ;  Surgeon: Alexis Frock, MD;  Location: [facility];  Service: Urology;  Laterality: N/A;    Social History   Social History  . Marital Status: Widowed    Spouse Name: N/A  . Number of Children: 3  . Years of Education: N/A   Occupational History  . [employer]    Social History Main Topics  . Smoking status: Former Smoker    Quit date: 09/06/2005  . Smokeless tobacco: Never Used  . Alcohol Use: 0.0 oz/week    0  Standard drinks or equivalent per week     Comment: Rarely  . Drug Use: No  . Sexual Activity: Yes   Other Topics Concern  . None   Social History Narrative    Review of Systems  Constitutional: Negative for fever, chills, appetite change, fatigue and unexpected weight change.  HENT: Negative for hearing loss.   Eyes: Negative for visual disturbance.  Respiratory: Negative for cough, shortness of breath and wheezing.   Cardiovascular: Negative for chest pain, palpitations and leg swelling.  Gastrointestinal: Negative for nausea, abdominal pain, diarrhea, constipation and blood in stool.       No GERD  Genitourinary: Negative for dysuria.  Musculoskeletal: Positive for back pain. Negative for arthralgias.  Neurological: Positive for numbness. Negative for light-headedness and headaches.  Psychiatric/Behavioral: Negative for dysphoric mood. The patient is not nervous/anxious.        Objective:   Filed Vitals:   07/16/15 1012  BP: 120/84  Pulse: 88  Temp: 98.6 F (37 C)  Resp: 16   Filed Weights   07/16/15 1012  Weight: 193 lb (87.544 kg)   Body mass index is 32.12 kg/(m^2).   Physical Exam  Constitutional: She appears well-developed and well-nourished.  HENT:  Head: Normocephalic and atraumatic.  Right Ear: External ear normal.  Left Ear: External ear normal.  Normal b/l ear canals and TMs  Eyes: Conjunctivae are normal.  Neck: Neck supple. No tracheal deviation present. No thyromegaly present.  No carotid bruit  Cardiovascular: Normal rate, regular rhythm, normal heart sounds and intact distal pulses.   Pulmonary/Chest: Effort normal and breath sounds normal. No respiratory distress. She has no wheezes.  Abdominal: Soft. She exhibits no distension. There is no tenderness.  Musculoskeletal: She exhibits no edema.  Lymphadenopathy:    She has no cervical adenopathy.  Skin: Skin is warm and dry.  Psychiatric: She has a normal mood and affect. Her behavior is  normal.         Assessment & Plan:   See Problem List.  Follow up annually for PE

## 2015-07-16 NOTE — Assessment & Plan Note (Signed)
Intermittent ? Lumbar radiculopathy or peripheral nerve pinched will order and EMG - further evaluation or testing depending on EMG results

## 2015-07-16 NOTE — Assessment & Plan Note (Signed)
Well controlled, stable Continue current medications Continue regular exercise Continue to monitor BP at home Needs blood work annually

## 2015-07-16 NOTE — Progress Notes (Signed)
Pre visit review using our clinic review tool, if applicable. No additional management support is needed unless otherwise documented below in the visit note. 

## 2015-07-16 NOTE — Assessment & Plan Note (Signed)
Possible recurrence of inguinal hernia Has had repeated surgery Will refer back to Dr. Ninfa Linden for eval, possible surgery

## 2015-07-16 NOTE — Patient Instructions (Addendum)
  We have reviewed your prior records including labs and tests today.  All other Health Maintenance issues reviewed.   All recommended immunizations and age-appropriate screenings are up-to-date.  Flu vaccine administered today.   Medications reviewed and updated.  No changes recommended at this time.  Your prescription(s) have been submitted to your pharmacy. Please take as directed and contact our office if you believe you are having problem(s) with the medication(s).  A referral for Dr. Ninfa Linden.  A nerve study was ordered to evaluate the numbness in your left leg.  You will hear from our office regarding both.  Please schedule follow up for a physical exam.

## 2015-07-16 NOTE — Assessment & Plan Note (Signed)
Intermittent lower back - possibly from thoracic scoliosis Usually relieved with rest Improved with regular exercise Has not needed any pain medication (was prescribe diclofenac and muscle relaxer in past)

## 2015-07-23 ENCOUNTER — Encounter: Payer: Self-pay | Admitting: Internal Medicine

## 2015-08-06 ENCOUNTER — Other Ambulatory Visit: Payer: Self-pay | Admitting: Medical

## 2015-08-15 ENCOUNTER — Other Ambulatory Visit: Payer: Self-pay | Admitting: Surgery

## 2015-08-15 DIAGNOSIS — R1032 Left lower quadrant pain: Secondary | ICD-10-CM

## 2015-08-22 ENCOUNTER — Encounter: Payer: Self-pay | Admitting: Internal Medicine

## 2015-08-27 ENCOUNTER — Ambulatory Visit
Admission: RE | Admit: 2015-08-27 | Discharge: 2015-08-27 | Disposition: A | Discharge status: Home / Self Care | Payer: BC Managed Care – PPO | Source: Ambulatory Visit | Attending: Surgery | Admitting: Surgery

## 2015-08-27 DIAGNOSIS — R1032 Left lower quadrant pain: Secondary | ICD-10-CM

## 2015-08-27 MED ORDER — IOPAMIDOL (ISOVUE-300) INJECTION 61%
100.0000 mL | Freq: Once | INTRAVENOUS | Status: AC | PRN
  Administered 2015-08-27: 100 mL via INTRAVENOUS

## 2015-10-09 ENCOUNTER — Other Ambulatory Visit: Payer: Self-pay | Admitting: Family Medicine

## 2015-10-22 ENCOUNTER — Telehealth: Payer: Self-pay | Admitting: Internal Medicine

## 2015-10-22 DIAGNOSIS — R200 Anesthesia of skin: Secondary | ICD-10-CM

## 2015-10-22 NOTE — Telephone Encounter (Signed)
For nerve conduction study please enter as amb referral to neurology and type "EMG" or "NCV" in the comments. Thank you!

## 2015-10-22 NOTE — Telephone Encounter (Signed)
Referral placed per your request.

## 2015-10-24 ENCOUNTER — Encounter (HOSPITAL_COMMUNITY): Payer: Self-pay | Admitting: *Deleted

## 2015-10-24 ENCOUNTER — Emergency Department (HOSPITAL_COMMUNITY): Payer: BC Managed Care – PPO

## 2015-10-24 ENCOUNTER — Emergency Department (HOSPITAL_COMMUNITY)
Admission: EM | Admit: 2015-10-24 | Discharge: 2015-10-24 | Disposition: A | Discharge status: Home / Self Care | Payer: BC Managed Care – PPO | Attending: Physician Assistant | Admitting: Physician Assistant

## 2015-10-24 DIAGNOSIS — Z79899 Other long term (current) drug therapy: Secondary | ICD-10-CM | POA: Insufficient documentation

## 2015-10-24 DIAGNOSIS — R2241 Localized swelling, mass and lump, right lower limb: Secondary | ICD-10-CM | POA: Diagnosis not present

## 2015-10-24 DIAGNOSIS — I10 Essential (primary) hypertension: Secondary | ICD-10-CM | POA: Diagnosis not present

## 2015-10-24 DIAGNOSIS — Z87891 Personal history of nicotine dependence: Secondary | ICD-10-CM | POA: Insufficient documentation

## 2015-10-24 DIAGNOSIS — M79671 Pain in right foot: Secondary | ICD-10-CM | POA: Insufficient documentation

## 2015-10-24 DIAGNOSIS — G43909 Migraine, unspecified, not intractable, without status migrainosus: Secondary | ICD-10-CM | POA: Insufficient documentation

## 2015-10-24 DIAGNOSIS — M79674 Pain in right toe(s): Secondary | ICD-10-CM | POA: Diagnosis not present

## 2015-10-24 MED ORDER — IBUPROFEN 800 MG PO TABS: 800.0000 mg | ORAL_TABLET | Freq: Three times a day (TID) | ORAL | Status: DC

## 2015-10-24 MED ORDER — HYDROCODONE-ACETAMINOPHEN 5-325 MG PO TABS: 1.0000 | ORAL_TABLET | ORAL | Status: DC | PRN

## 2015-10-24 MED ORDER — COLCHICINE 0.6 MG PO TABS
1.2000 mg | ORAL_TABLET | Freq: Once | ORAL | Status: AC
  Administered 2015-10-24: 1.2 mg via ORAL
  Filled 2015-10-24: qty 2

## 2015-10-24 MED ORDER — COLCHICINE 0.6 MG PO TABS: 0.6000 mg | ORAL_TABLET | Freq: Every day | ORAL | Status: DC

## 2015-10-24 MED ORDER — HYDROCODONE-ACETAMINOPHEN 5-325 MG PO TABS
2.0000 | ORAL_TABLET | Freq: Once | ORAL | Status: AC
  Administered 2015-10-24: 2 via ORAL
  Filled 2015-10-24: qty 2

## 2015-10-24 MED ORDER — KETOROLAC TROMETHAMINE 60 MG/2ML IM SOLN
60.0000 mg | Freq: Once | INTRAMUSCULAR | Status: AC
  Administered 2015-10-24: 60 mg via INTRAMUSCULAR
  Filled 2015-10-24: qty 2

## 2015-10-24 NOTE — ED Provider Notes (Signed)
CSN: [id number]     Arrival date & time 10/24/15  1409 History  By signing my name below, I, Rayna Sexton, attest that this documentation has been prepared under the direction and in the presence of Shawn C. Joy, PA-C. Electronically Signed: Rayna Sexton, ED Scribe. 10/24/2015. 4:49 PM.    Chief Complaint  Patient presents with  . Foot Pain    Rt   The history is provided by the patient. No language interpreter was used.    HPI Comments: Helen Young is a 55 y.o. female who presents to the Emergency Department complaining of worsening, non-radiating, sudden onset, 10/10, throbbing, right superior and medial foot pain onset this morning. Pt notes that she was walking normally this morning and then noticed pain in her right toes and mild, diffuse, swelling. Her pain worsens with palpation and movement of the affected foot. She has a nerve study scheduled for 2/28 due to a PMHx of scoliosis and DDD. Pt denies a PMHx of DM. She denies fevers, chills, n/v, neuro deficits, or any other associated symptoms at this time.    Past Medical History  Diagnosis Date  . HTN (hypertension)   . Environmental allergies 02-27-13    OTC meds daily  . Headache(784.0) 02-27-13    occ. migraines  . Transfusion history     childhood s/p Tonsillectomy   Past Surgical History  Procedure Laterality Date  . Inguinal hernia repair      x's 2   . Knee surgery Left     torn meniscus - arthroscopic repair  . Total abdominal hysterectomy w/ bilateral salpingoophorectomy      prolapsed uterus at 38-46 years old, ovaries and tubes removed 10 years later due to cysts  . Neck surgery      Disc replaced with a steel plate  . Tonsillectomy      childhoood  . Abdominal hysterectomy    . Incisional hernia repair N/A 03/02/2013    Procedure: LAPAROSCOPIC INCISIONAL HERNIA CONVERTED TO OPEN ;  Surgeon: Harl Bowie, MD;  Location: [facility];  Service: General;  Laterality: N/A;  . Cystostomy N/A 03/02/2013     Procedure: CYSTOSTOMY CLOSURE ;  Surgeon: Alexis Frock, MD;  Location: [facility];  Service: Urology;  Laterality: N/A;   Family History  Problem Relation Age of Onset  . Colon cancer Father   . Prostate cancer Father   . Heart disease Father   . Hypertension Father   . Heart disease Mother   . Stroke Mother   . Hypertension Mother   . Kidney disease Maternal Grandfather   . Diabetes Maternal Grandfather   . Sudden death Paternal Grandfather   . Sudden death Paternal Uncle     Cardiac issues   Social History  Substance Use Topics  . Smoking status: Former Smoker    Quit date: 09/06/2005  . Smokeless tobacco: Never Used  . Alcohol Use: 0.0 oz/week    0 Standard drinks or equivalent per week     Comment: Rarely   OB History    No data available     Review of Systems  Constitutional: Negative for fever and chills.  Gastrointestinal: Negative for nausea and vomiting.  Musculoskeletal: Positive for joint swelling and arthralgias.  Skin: Negative for color change, pallor and wound.    Allergies  Fish allergy and Percocet  Home Medications   Prior to Admission medications   Medication Sig Start Date End Date Taking? Authorizing Provider  amLODipine (NORVASC) 5 MG  tablet TAKE 1 TABLET(5 MG) BY MOUTH DAILY 08/06/15   Mackie Pai, PA-C  cetirizine (ZYRTEC) 10 MG tablet Take 10 mg by mouth at bedtime.     Historical Provider, MD  colchicine 0.6 MG tablet Take 1 tablet (0.6 mg total) by mouth daily. 10/24/15   Shawn C Joy, PA-C  HYDROcodone-acetaminophen (NORCO/VICODIN) 5-325 MG tablet Take 1-2 tablets by mouth every 4 (four) hours as needed. 10/24/15   Shawn C Joy, PA-C  ibuprofen (ADVIL,MOTRIN) 800 MG tablet Take 1 tablet (800 mg total) by mouth 3 (three) times daily. 10/24/15   Shawn C Joy, PA-C  mometasone (NASONEX) 50 MCG/ACT nasal spray Place 2 sprays into the nose daily as needed (for allergies).     Historical Provider, MD  PREMARIN 0.625 MG tablet TAKE ONE TABLET BY MOUTH  DAILY 09/09/13   Midge Minium, MD  tetrahydrozoline 0.05 % ophthalmic solution Place 1 drop into both eyes daily as needed (for dry eyes).    Historical Provider, MD  valsartan-hydrochlorothiazide (DIOVAN-HCT) 320-25 MG per tablet Take 1 tablet by mouth daily. Office visit due now 05/08/15   Alferd Apa Lowne, DO   BP 141/94 mmHg  Pulse 87  Temp(Src) 98.2 F (36.8 C) (Oral)  Resp 18  SpO2 100%    Physical Exam  Constitutional: She is oriented to person, place, and time. She appears well-developed and well-nourished.  HENT:  Head: Normocephalic and atraumatic.  Eyes: Conjunctivae are normal.  Neck: Normal range of motion. Neck supple.  Cardiovascular: Normal rate.   Pulmonary/Chest: Effort normal. No respiratory distress.  Musculoskeletal: Normal range of motion. She exhibits edema and tenderness.  Right foot: swelling and exquisite tenderness across superior surface to proximal toe joint; swelling on medial surface and some swelling on inferior surface just proximal to the right toe; CMS intact. No increased warmth.  Neurological: She is alert and oriented to person, place, and time.  No sensory deficits.  Skin: Skin is warm and dry.  Psychiatric: She has a normal mood and affect.  Nursing note and vitals reviewed.   ED Course  Procedures  DIAGNOSTIC STUDIES: Oxygen Saturation is 100% on RA, normal by my interpretation.    COORDINATION OF CARE: 4:47 PM Discussed next steps with pt. She verbalized understanding and is agreeable with the plan.   Labs Review Labs Reviewed - No data to display  Imaging Review Dg Foot Complete Right  10/24/2015  CLINICAL DATA:  Acute right foot pain without known injury. EXAM: RIGHT FOOT COMPLETE - 3+ VIEW COMPARISON:  None. FINDINGS: There is no evidence of fracture or dislocation. There is no evidence of arthropathy or other focal bone abnormality. Soft tissues are unremarkable. IMPRESSION: Normal right foot. Electronically Signed   By: Marijo Conception, M.D.   On: 10/24/2015 14:56   I have personally reviewed and evaluated these images as part of my medical decision-making.   EKG Interpretation None      MDM   Final diagnoses:  Foot pain, right    Helen Young presents with right foot pain and swelling that occurred this morning.  This patient's presentation, especially the exquisite tenderness and pain out of proportion from the appearance, gives evidence to this being an acute gout. She had no trauma to the area. Patient was started on culture seen and prescribed anti-inflammatory medications. Patient was given very strict return precautions. Patient voices understanding of these instructions, accepts the plan, and is comfortable with discharge.  I personally performed the services described  in this documentation, which was scribed in my presence. The recorded information has been reviewed and is accurate.    Lorayne Bender, PA-C 10/24/15 2007  Courteney Lyn Mackuen, MD 10/24/15 [phone]

## 2015-10-24 NOTE — Discharge Instructions (Signed)
You have been seen today for foot pain. Your imaging showed no abnormalities. Your symptoms give evidence for gout. Follow up with orthopedics as soon as possible. Follow up with PCP as needed. Return to ED should symptoms worsen. Take the colchicine as directed until symptoms resolve. Complete cessation of treatment can be safely done within two to three days of complete resolution of the attack. Take ibuprofen every 8 hours in addition to the colchicine.

## 2015-10-24 NOTE — ED Notes (Signed)
Pt has a ride home.  

## 2015-10-24 NOTE — ED Notes (Signed)
Pt states about 10 am developed pain and swelling in rt foot, across top of foot tender, good pulse, warm to touch. No injury noted

## 2015-11-04 ENCOUNTER — Ambulatory Visit (INDEPENDENT_AMBULATORY_CARE_PROVIDER_SITE_OTHER): Payer: BC Managed Care – PPO | Admitting: Neurology

## 2015-11-04 DIAGNOSIS — R208 Other disturbances of skin sensation: Secondary | ICD-10-CM

## 2015-11-04 DIAGNOSIS — R200 Anesthesia of skin: Secondary | ICD-10-CM

## 2015-11-04 NOTE — Procedures (Signed)
[facility]  [address], [address]  [place], [place] [address] Tel: [phone] Fax:  [phone] Test Date:  11/04/2015  Patient: Helen Young DOB: Jan 12, 1961 Physician: Narda Amber, DO  Sex: Female Height: 5\' 58"  Ref Phys: Billey Gosling, M.D.  ID#: [id number] Temp: 32.2C Technician: Jerilynn Mages. Dean   Patient Complaints: This is a 55 year old female referred for evaluation of intermittent left lower leg paresthesias.  NCV & EMG Findings: Extensive electrodiagnostic testing of the left lower extremity shows: 1. Left sural and superficial peroneal sensory responses are within normal limits. 2. Left peroneal and tibial motor responses are within normal limits. 3. Left tibial H reflex study is within normal limits. 4. There is no evidence of active or chronic motor axon loss changes affecting any of the tested muscles. Motor unit configuration and recruitment pattern is within normal limits.  Impression: This is a normal study of the left lower extremity. In particular, there is no evidence of a generalized sensorimotor polyneuropathy or lumbosacral radiculopathy.   ___________________________ Narda Amber, DO    Nerve Conduction Studies Anti Sensory Summary Table   Site NR Peak (ms) Norm Peak (ms) P-T Amp (V) Norm P-T Amp  Left Sup Peroneal Anti Sensory (Ant Lat Mall)  32.2C  12 cm    3.3 <4.6 9.7 >4  Left Sural Anti Sensory (Lat Mall)  32.2C  Calf    3.3 <4.6 5.6 >4   Motor Summary Table   Site NR Onset (ms) Norm Onset (ms) O-P Amp (mV) Norm O-P Amp Site1 Site2 Delta-0 (ms) Dist (cm) Vel (m/s) Norm Vel (m/s)  Left Peroneal Motor (Ext Dig Brev)  32.2C  Ankle    3.6 <6.0 5.2 >2.5 B Fib Ankle 6.6 34.0 52 >40  B Fib    10.2  5.2  Poplt B Fib 1.9 10.0 53 >40  Poplt    12.1  5.0         Left Tibial Motor (Abd Hall Brev)  32.2C  Ankle    4.7 <6.0 7.0 >4 Knee Ankle 7.3 36.0 49 >40  Knee    12.0  5.0          H Reflex Studies   NR H-Lat (ms) Lat Norm (ms) L-R H-Lat  (ms) M-Lat (ms) HLat-MLat (ms)  Left Tibial (Gastroc)  32.2C     31.70 <35  4.35 27.35   EMG   Side Muscle Ins Act Fibs Psw Fasc Number Recrt Dur Dur. Amp Amp. Poly Poly. Comment  Left AntTibialis Nml Nml Nml Nml Nml Nml Nml Nml Nml Nml Nml Nml N/A  Left Gastroc Nml Nml Nml Nml Nml Nml Nml Nml Nml Nml Nml Nml N/A  Left Flex Dig Long Nml Nml Nml Nml Nml Nml Nml Nml Nml Nml Nml Nml N/A  Left RectFemoris Nml Nml Nml Nml Nml Nml Nml Nml Nml Nml Nml Nml N/A  Left GluteusMed Nml Nml Nml Nml Nml Nml Nml Nml Nml Nml Nml Nml N/A  Left BicepsFemS Nml Nml Nml Nml Nml Nml Nml Nml Nml Nml Nml Nml N/A      Waveforms:

## 2015-11-06 ENCOUNTER — Ambulatory Visit (INDEPENDENT_AMBULATORY_CARE_PROVIDER_SITE_OTHER): Payer: BC Managed Care – PPO | Admitting: Internal Medicine

## 2015-11-06 ENCOUNTER — Encounter: Payer: Self-pay | Admitting: Internal Medicine

## 2015-11-06 VITALS — BP 136/90 | HR 81 | Temp 98.2°F | Resp 16 | Wt 192.0 lb

## 2015-11-06 DIAGNOSIS — G8929 Other chronic pain: Secondary | ICD-10-CM | POA: Diagnosis not present

## 2015-11-06 DIAGNOSIS — N62 Hypertrophy of breast: Secondary | ICD-10-CM | POA: Diagnosis not present

## 2015-11-06 DIAGNOSIS — M549 Dorsalgia, unspecified: Secondary | ICD-10-CM

## 2015-11-06 DIAGNOSIS — M546 Pain in thoracic spine: Secondary | ICD-10-CM | POA: Diagnosis not present

## 2015-11-06 DIAGNOSIS — M542 Cervicalgia: Secondary | ICD-10-CM | POA: Diagnosis not present

## 2015-11-06 NOTE — Progress Notes (Signed)
Pre visit review using our clinic review tool, if applicable. No additional management support is needed unless otherwise documented below in the visit note. 

## 2015-11-06 NOTE — Progress Notes (Signed)
Subjective:    Patient ID: Helen Young, female    DOB: November 29, 1960, 55 y.o.   MRN: [id number]  HPI  She is here to get a referral for a plastic surgeon for breast reduction surgery.   She has chronic neck and upper back pain.  She is starting to get shoulder pain as well.  She has large breasts and the pressure on her upper back is intense.  She cant wait to take off her bra when she gets home.  She was told by an orthopedic that she should have a breast reduction.  She has had neck surgery in the past for disc disease.    Her muscle pain is chronic and it is getting worse.     Medications and allergies reviewed with patient and updated if appropriate.  Patient Active Problem List   Diagnosis Date Noted  . Numbness in left leg 07/16/2015  . Groin pain 07/16/2015  . Back pain 12/06/2014  . Obesity (BMI 30-39.9) 10/15/2013  . Recurrent ventral incisional hernia 02/09/2013  . Thoracic back pain 10/09/2012  . HTN (hypertension) 04/06/2012  . Scoliosis 04/06/2012  . Environmental allergies 04/06/2012  . Postmenopausal 04/06/2012    Current Outpatient Prescriptions on File Prior to Visit  Medication Sig Dispense Refill  . amLODipine (NORVASC) 5 MG tablet TAKE 1 TABLET(5 MG) BY MOUTH DAILY 30 tablet 0  . cetirizine (ZYRTEC) 10 MG tablet Take 10 mg by mouth at bedtime.     . colchicine 0.6 MG tablet Take 1 tablet (0.6 mg total) by mouth daily. 15 tablet 1  . HYDROcodone-acetaminophen (NORCO/VICODIN) 5-325 MG tablet Take 1-2 tablets by mouth every 4 (four) hours as needed. 6 tablet 0  . ibuprofen (ADVIL,MOTRIN) 800 MG tablet Take 1 tablet (800 mg total) by mouth 3 (three) times daily. 21 tablet 0  . mometasone (NASONEX) 50 MCG/ACT nasal spray Place 2 sprays into the nose daily as needed (for allergies).     Marland Kitchen PREMARIN 0.625 MG tablet TAKE ONE TABLET BY MOUTH DAILY 30 tablet 5  . tetrahydrozoline 0.05 % ophthalmic solution Place 1 drop into both eyes daily as needed (for dry eyes).      . valsartan-hydrochlorothiazide (DIOVAN-HCT) 320-25 MG per tablet Take 1 tablet by mouth daily. Office visit due now 90 tablet 0   No current facility-administered medications on file prior to visit.    Past Medical History  Diagnosis Date  . HTN (hypertension)   . Environmental allergies 02-27-13    OTC meds daily  . Headache(784.0) 02-27-13    occ. migraines  . Transfusion history     childhood s/p Tonsillectomy    Past Surgical History  Procedure Laterality Date  . Inguinal hernia repair      x's 2   . Knee surgery Left     torn meniscus - arthroscopic repair  . Total abdominal hysterectomy w/ bilateral salpingoophorectomy      prolapsed uterus at 83-59 years old, ovaries and tubes removed 10 years later due to cysts  . Neck surgery      Disc replaced with a steel plate  . Tonsillectomy      childhoood  . Abdominal hysterectomy    . Incisional hernia repair N/A 03/02/2013    Procedure: LAPAROSCOPIC INCISIONAL HERNIA CONVERTED TO OPEN ;  Surgeon: Harl Bowie, MD;  Location: [facility];  Service: General;  Laterality: N/A;  . Cystostomy N/A 03/02/2013    Procedure: CYSTOSTOMY CLOSURE ;  Surgeon: Alexis Frock, MD;  Location: [facility];  Service: Urology;  Laterality: N/A;    Social History   Social History  . Marital Status: Widowed    Spouse Name: N/A  . Number of Children: 3  . Years of Education: N/A   Occupational History  . [employer]    Social History Main Topics  . Smoking status: Former Smoker    Quit date: 09/06/2005  . Smokeless tobacco: Never Used  . Alcohol Use: 0.0 oz/week    0 Standard drinks or equivalent per week     Comment: Rarely  . Drug Use: No  . Sexual Activity: Yes   Other Topics Concern  . None   Social History Narrative    Family History  Problem Relation Age of Onset  . Colon cancer Father   . Prostate cancer Father   . Heart disease Father   . Hypertension Father   . Heart disease Mother   . Stroke Mother   .  Hypertension Mother   . Kidney disease Maternal Grandfather   . Diabetes Maternal Grandfather   . Sudden death Paternal Grandfather   . Sudden death Paternal Uncle     Cardiac issues    Review of Systems See HPI    Objective:   Filed Vitals:   11/06/15 1044  BP: 136/90  Pulse: 81  Temp: 98.2 F (36.8 C)  Resp: 16   Filed Weights   11/06/15 1044  Weight: 192 lb (87.091 kg)   Body mass index is 31.95 kg/(m^2).   Physical Exam  Constitutional: She appears well-developed and well-nourished.  Musculoskeletal: Normal range of motion. She exhibits tenderness (cervical spine posterior cervical paravertebral muscle, upper thoracic spine, thoracic vertebral muscle, upper back extending to her shoulders). She exhibits no edema.  Skin:  Indentations in upper back from bra straps b/l          Assessment & Plan:   She has large breasts and this is causing chronic neck, upper back and now shoulder pain.  She has chronic muscle pain and tightness Referral ordered for plastic surgery I think this surgery is medically necessary given her degree of chronic musculoskeletal pain

## 2015-11-06 NOTE — Patient Instructions (Signed)
A referral was ordered for plastic surgery.

## 2015-11-26 ENCOUNTER — Encounter: Payer: Self-pay | Admitting: Internal Medicine

## 2015-11-27 ENCOUNTER — Encounter: Payer: Self-pay | Admitting: Gastroenterology

## 2015-11-27 NOTE — Telephone Encounter (Signed)
Letter written please fax

## 2015-11-29 ENCOUNTER — Other Ambulatory Visit: Payer: Self-pay | Admitting: Medical

## 2015-12-02 ENCOUNTER — Encounter: Payer: Self-pay | Admitting: Gastroenterology

## 2015-12-16 ENCOUNTER — Other Ambulatory Visit (INDEPENDENT_AMBULATORY_CARE_PROVIDER_SITE_OTHER): Payer: BC Managed Care – PPO

## 2015-12-16 ENCOUNTER — Encounter: Payer: Self-pay | Admitting: Internal Medicine

## 2015-12-16 ENCOUNTER — Ambulatory Visit (INDEPENDENT_AMBULATORY_CARE_PROVIDER_SITE_OTHER): Payer: BC Managed Care – PPO | Admitting: Internal Medicine

## 2015-12-16 VITALS — BP 132/90 | HR 79 | Temp 98.4°F | Resp 16 | Wt 192.0 lb

## 2015-12-16 DIAGNOSIS — I10 Essential (primary) hypertension: Secondary | ICD-10-CM

## 2015-12-16 DIAGNOSIS — Z139 Encounter for screening, unspecified: Secondary | ICD-10-CM

## 2015-12-16 DIAGNOSIS — Z0000 Encounter for general adult medical examination without abnormal findings: Secondary | ICD-10-CM

## 2015-12-16 LAB — HIV ANTIBODY (ROUTINE TESTING W REFLEX): HIV 1&2 Ab, 4th Generation: NONREACTIVE

## 2015-12-16 LAB — CBC WITH DIFFERENTIAL/PLATELET
BASOS ABS: 0 10*3/uL (ref 0.0–0.1)
BASOS PCT: 0.7 % (ref 0.0–3.0)
Eosinophils Absolute: 0.2 10*3/uL (ref 0.0–0.7)
Eosinophils Relative: 2.6 % (ref 0.0–5.0)
HCT: 40.8 % (ref 36.0–46.0)
Hemoglobin: 13.5 g/dL (ref 12.0–15.0)
LYMPHS ABS: 3.1 10*3/uL (ref 0.7–4.0)
Lymphocytes Relative: 45.9 % (ref 12.0–46.0)
MCHC: 33.1 g/dL (ref 30.0–36.0)
MCV: 87.9 fl (ref 78.0–100.0)
MONOS PCT: 7.9 % (ref 3.0–12.0)
Monocytes Absolute: 0.5 10*3/uL (ref 0.1–1.0)
NEUTROS ABS: 2.9 10*3/uL (ref 1.4–7.7)
NEUTROS PCT: 42.9 % — AB (ref 43.0–77.0)
PLATELETS: 386 10*3/uL (ref 150.0–400.0)
RBC: 4.65 Mil/uL (ref 3.87–5.11)
RDW: 12.4 % (ref 11.5–15.5)
WBC: 6.8 10*3/uL (ref 4.0–10.5)

## 2015-12-16 LAB — COMPREHENSIVE METABOLIC PANEL
ALT: 10 U/L (ref 0–35)
AST: 13 U/L (ref 0–37)
Albumin: 4.2 g/dL (ref 3.5–5.2)
Alkaline Phosphatase: 45 U/L (ref 39–117)
BUN: 11 mg/dL (ref 6–23)
CALCIUM: 9.4 mg/dL (ref 8.4–10.5)
CHLORIDE: 106 meq/L (ref 96–112)
CO2: 28 meq/L (ref 19–32)
Creatinine, Ser: 0.79 mg/dL (ref 0.40–1.20)
GFR: 96.68 mL/min (ref >60.00)
GLUCOSE: 83 mg/dL (ref 70–99)
POTASSIUM: 3.9 meq/L (ref 3.5–5.1)
Sodium: 141 mEq/L (ref 135–145)
Total Bilirubin: 0.5 mg/dL (ref 0.2–1.2)
Total Protein: 7.5 g/dL (ref 6.0–8.3)

## 2015-12-16 LAB — LIPID PANEL
CHOL/HDL RATIO: 3
Cholesterol: 188 mg/dL (ref 0–200)
HDL: 54.8 mg/dL (ref >39.00)
LDL CALC: 110 mg/dL — AB (ref 0–99)
NONHDL: 133.17
TRIGLYCERIDES: 115 mg/dL (ref 0.0–149.0)
VLDL: 23 mg/dL (ref 0.0–40.0)

## 2015-12-16 LAB — TSH: TSH: 0.71 u[IU]/mL (ref 0.35–4.50)

## 2015-12-16 MED ORDER — VALSARTAN-HYDROCHLOROTHIAZIDE 320-25 MG PO TABS
1.0000 | ORAL_TABLET | Freq: Every day | ORAL | Status: DC
Start: 2015-12-16 — End: 2016-12-01

## 2015-12-16 MED ORDER — AMLODIPINE BESYLATE 5 MG PO TABS: ORAL_TABLET | ORAL | Status: DC

## 2015-12-16 NOTE — Progress Notes (Signed)
Subjective:    Patient ID: Helen Young, female    DOB: 05/30/1961, 55 y.o.   MRN: [id number]  HPI She is here for a physical exam.   She does not have any concerns or questions.   Hypertension: She is taking her medication daily, but rans out of the valsartan-hctz 4 days ago. She is compliant with a low sodium diet.  She denies chest pain, palpitations, edema, shortness of breath and regular headaches. She is exercising, but not as much as she would like and not regularly.  She does monitor her blood pressure at home, 115/79, she will occasionally see a 130.      Medications and allergies reviewed with patient and updated if appropriate.  Patient Active Problem List   Diagnosis Date Noted  . Numbness in left leg 07/16/2015  . Groin pain 07/16/2015  . Back pain 12/06/2014  . Obesity (BMI 30-39.9) 10/15/2013  . Recurrent ventral incisional hernia 02/09/2013  . Thoracic back pain 10/09/2012  . HTN (hypertension) 04/06/2012  . Scoliosis 04/06/2012  . Environmental allergies 04/06/2012  . Postmenopausal 04/06/2012    Current Outpatient Prescriptions on File Prior to Visit  Medication Sig Dispense Refill  . amLODipine (NORVASC) 5 MG tablet TAKE 1 TABLET(5 MG) BY MOUTH DAILY 30 tablet 0  . cetirizine (ZYRTEC) 10 MG tablet Take 10 mg by mouth at bedtime.     . colchicine 0.6 MG tablet Take 1 tablet (0.6 mg total) by mouth daily. 15 tablet 1  . ibuprofen (ADVIL,MOTRIN) 800 MG tablet Take 1 tablet (800 mg total) by mouth 3 (three) times daily. 21 tablet 0  . mometasone (NASONEX) 50 MCG/ACT nasal spray Place 2 sprays into the nose daily as needed (for allergies).     Marland Kitchen PREMARIN 0.625 MG tablet TAKE ONE TABLET BY MOUTH DAILY 30 tablet 5  . tetrahydrozoline 0.05 % ophthalmic solution Place 1 drop into both eyes daily as needed (for dry eyes).    . valsartan-hydrochlorothiazide (DIOVAN-HCT) 320-25 MG per tablet Take 1 tablet by mouth daily. Office visit due now 90 tablet 0   No  current facility-administered medications on file prior to visit.    Past Medical History  Diagnosis Date  . HTN (hypertension)   . Environmental allergies 02-27-13    OTC meds daily  . Headache(784.0) 02-27-13    occ. migraines  . Transfusion history     childhood s/p Tonsillectomy    Past Surgical History  Procedure Laterality Date  . Inguinal hernia repair      x's 2   . Knee surgery Left     torn meniscus - arthroscopic repair  . Total abdominal hysterectomy w/ bilateral salpingoophorectomy      prolapsed uterus at 89-23 years old, ovaries and tubes removed 10 years later due to cysts  . Neck surgery      Disc replaced with a steel plate  . Tonsillectomy      childhoood  . Abdominal hysterectomy    . Incisional hernia repair N/A 03/02/2013    Procedure: LAPAROSCOPIC INCISIONAL HERNIA CONVERTED TO OPEN ;  Surgeon: Harl Bowie, MD;  Location: [facility];  Service: General;  Laterality: N/A;  . Cystostomy N/A 03/02/2013    Procedure: CYSTOSTOMY CLOSURE ;  Surgeon: Alexis Frock, MD;  Location: [facility];  Service: Urology;  Laterality: N/A;    Social History   Social History  . Marital Status: Widowed    Spouse Name: N/A  . Number of Children: 3  .  Years of Education: N/A   Occupational History  . [employer]    Social History Main Topics  . Smoking status: Former Smoker    Quit date: 09/06/2005  . Smokeless tobacco: Never Used  . Alcohol Use: 0.0 oz/week    0 Standard drinks or equivalent per week     Comment: Rarely  . Drug Use: No  . Sexual Activity: Yes   Other Topics Concern  . Not on file   Social History Narrative    Family History  Problem Relation Age of Onset  . Colon cancer Father   . Prostate cancer Father   . Heart disease Father   . Hypertension Father   . Heart disease Mother   . Stroke Mother   . Hypertension Mother   . Kidney disease Maternal Grandfather   . Diabetes Maternal Grandfather   . Sudden death Paternal  Grandfather   . Sudden death Paternal Uncle     Cardiac issues    Review of Systems  Constitutional: Negative for fever, chills, appetite change and fatigue.  HENT: Negative for hearing loss.   Eyes: Negative for visual disturbance.  Respiratory: Negative for cough, shortness of breath and wheezing.   Cardiovascular: Negative for chest pain, palpitations and leg swelling.  Gastrointestinal: Negative for nausea, abdominal pain, diarrhea, constipation and blood in stool.       No gerd  Genitourinary: Negative for dysuria and hematuria.  Musculoskeletal: Positive for neck pain and neck stiffness. Negative for myalgias, back pain and arthralgias.  Skin: Negative for color change and rash.  Neurological: Negative for dizziness, light-headedness and headaches.  Psychiatric/Behavioral: Negative for dysphoric mood. The patient is not nervous/anxious.        Objective:   Filed Vitals:   12/16/15 1526  BP: 132/90  Pulse: 79  Temp: 98.4 F (36.9 C)  Resp: 16   Filed Weights   12/16/15 1526  Weight: 192 lb (87.091 kg)   Body mass index is 31.95 kg/(m^2).   Physical Exam Constitutional: She appears well-developed and well-nourished. No distress.  HENT:  Head: Normocephalic and atraumatic.  Right Ear: External ear normal. Normal ear canal and TM Left Ear: External ear normal.  Normal ear canal and TM Mouth/Throat: Oropharynx is clear and moist.  Eyes: Conjunctivae and EOM are normal.  Neck: Neck supple. No tracheal deviation present. No thyromegaly present.  No carotid bruit  Cardiovascular: Normal rate, regular rhythm and normal heart sounds.   No murmur heard.  No edema. Pulmonary/Chest: Effort normal and breath sounds normal. No respiratory distress. She has no wheezes. She has no rales.  Breast: deferred to Gyn Abdominal: Soft. She exhibits no distension. There is no tenderness.  Lymphadenopathy: She has no cervical adenopathy.  Skin: Skin is warm and dry. She is not  diaphoretic.  Psychiatric: She has a normal mood and affect. Her behavior is normal.         Assessment & Plan:   Physical exam: Screening blood work ordered, including hiv and hcv Immunizations   Up to date  Colonoscopy  Up to date  - has one scheduled for next month - dad had colon ca Mammogram Up to date - September 2016 Gyn Up to date  - September 2016 Dexa  -had one 3-4 years ago - normal Eye exams  Up to date  EKG - last 2013, will not repeat today Exercise  -irregular exercise - trying to make more regular Weight - has lost some weight and working on weight  loss Skin  - no changes or concerns. Advised monitoring skin regularly Substance  - no evidence of abuse  Scheduled for breast reduction surgery in July 2017 if approved by insurance  See Problem List for Assessment and Plan of chronic medical problems.  Follow up annually

## 2015-12-16 NOTE — Assessment & Plan Note (Signed)
BP slightly elevated here today, but has not taken one of her meds for 4 days Monitors it at home and it is well controlled Continue currents meds Check cmp

## 2015-12-16 NOTE — Progress Notes (Signed)
Pre visit review using our clinic review tool, if applicable. No additional management support is needed unless otherwise documented below in the visit note. 

## 2015-12-16 NOTE — Patient Instructions (Addendum)
Test(s) ordered today. Your results will be released to MyChart (or called to you) after review, usually within 72hours after test completion. If any changes need to be made, you will be notified at that same time.  All other Health Maintenance issues reviewed.   All recommended immunizations and age-appropriate screenings are up-to-date or discussed.  No immunizations administered today.   Medications reviewed and updated.  No changes recommended at this time.  Your prescription(s) have been submitted to your pharmacy. Please take as directed and contact our office if you believe you are having problem(s) with the medication(s).   Please followup in one year   Health Maintenance, Female Adopting a healthy lifestyle and getting preventive care can go a long way to promote health and wellness. Talk with your health care provider about what schedule of regular examinations is right for you. This is a good chance for you to check in with your provider about disease prevention and staying healthy. In between checkups, there are plenty of things you can do on your own. Experts have done a lot of research about which lifestyle changes and preventive measures are most likely to keep you healthy. Ask your health care provider for more information. WEIGHT AND DIET  Eat a healthy diet  Be sure to include plenty of vegetables, fruits, low-fat dairy products, and lean protein.  Do not eat a lot of foods high in solid fats, added sugars, or salt.  Get regular exercise. This is one of the most important things you can do for your health.  Most adults should exercise for at least 150 minutes each week. The exercise should increase your heart rate and make you sweat (moderate-intensity exercise).  Most adults should also do strengthening exercises at least twice a week. This is in addition to the moderate-intensity exercise.  Maintain a healthy weight  Body mass index (BMI) is a measurement that  can be used to identify possible weight problems. It estimates body fat based on height and weight. Your health care provider can help determine your BMI and help you achieve or maintain a healthy weight.  For females 20 years of age and older:   A BMI below 18.5 is considered underweight.  A BMI of 18.5 to 24.9 is normal.  A BMI of 25 to 29.9 is considered overweight.  A BMI of 30 and above is considered obese.  Watch levels of cholesterol and blood lipids  You should start having your blood tested for lipids and cholesterol at 57 years of age, then have this test every 5 years.  You may need to have your cholesterol levels checked more often if:  Your lipid or cholesterol levels are high.  You are older than 57 years of age.  You are at high risk for heart disease.  CANCER SCREENING   Lung Cancer  Lung cancer screening is recommended for adults 55-80 years old who are at high risk for lung cancer because of a history of smoking.  A yearly low-dose CT scan of the lungs is recommended for people who:  Currently smoke.  Have quit within the past 15 years.  Have at least a 30-pack-year history of smoking. A pack year is smoking an average of one pack of cigarettes a day for 1 year.  Yearly screening should continue until it has been 15 years since you quit.  Yearly screening should stop if you develop a health problem that would prevent you from having lung cancer treatment.  Breast Cancer    Practice breast self-awareness. This means understanding how your breasts normally appear and feel.  It also means doing regular breast self-exams. Let your health care provider know about any changes, no matter how small.  If you are in your 20s or 30s, you should have a clinical breast exam (CBE) by a health care provider every 1-3 years as part of a regular health exam.  If you are 40 or older, have a CBE every year. Also consider having a breast X-ray (mammogram) every  year.  If you have a family history of breast cancer, talk to your health care provider about genetic screening.  If you are at high risk for breast cancer, talk to your health care provider about having an MRI and a mammogram every year.  Breast cancer gene (BRCA) assessment is recommended for women who have family members with BRCA-related cancers. BRCA-related cancers include:  Breast.  Ovarian.  Tubal.  Peritoneal cancers.  Results of the assessment will determine the need for genetic counseling and BRCA1 and BRCA2 testing. Cervical Cancer Your health care provider may recommend that you be screened regularly for cancer of the pelvic organs (ovaries, uterus, and vagina). This screening involves a pelvic examination, including checking for microscopic changes to the surface of your cervix (Pap test). You may be encouraged to have this screening done every 3 years, beginning at age 21.  For women ages 30-65, health care providers may recommend pelvic exams and Pap testing every 3 years, or they may recommend the Pap and pelvic exam, combined with testing for human papilloma virus (HPV), every 5 years. Some types of HPV increase your risk of cervical cancer. Testing for HPV may also be done on women of any age with unclear Pap test results.  Other health care providers may not recommend any screening for nonpregnant women who are considered low risk for pelvic cancer and who do not have symptoms. Ask your health care provider if a screening pelvic exam is right for you.  If you have had past treatment for cervical cancer or a condition that could lead to cancer, you need Pap tests and screening for cancer for at least 20 years after your treatment. If Pap tests have been discontinued, your risk factors (such as having a new sexual partner) need to be reassessed to determine if screening should resume. Some women have medical problems that increase the chance of getting cervical cancer. In  these cases, your health care provider may recommend more frequent screening and Pap tests. Colorectal Cancer  This type of cancer can be detected and often prevented.  Routine colorectal cancer screening usually begins at 57 years of age and continues through 57 years of age.  Your health care provider may recommend screening at an earlier age if you have risk factors for colon cancer.  Your health care provider may also recommend using home test kits to check for hidden blood in the stool.  A small camera at the end of a tube can be used to examine your colon directly (sigmoidoscopy or colonoscopy). This is done to check for the earliest forms of colorectal cancer.  Routine screening usually begins at age 50.  Direct examination of the colon should be repeated every 5-10 years through 57 years of age. However, you may need to be screened more often if early forms of precancerous polyps or small growths are found. Skin Cancer  Check your skin from head to toe regularly.  Tell your health care provider about any   new moles or changes in moles, especially if there is a change in a mole's shape or color.  Also tell your health care provider if you have a mole that is larger than the size of a pencil eraser.  Always use sunscreen. Apply sunscreen liberally and repeatedly throughout the day.  Protect yourself by wearing long sleeves, pants, a wide-brimmed hat, and sunglasses whenever you are outside. HEART DISEASE, DIABETES, AND HIGH BLOOD PRESSURE   High blood pressure causes heart disease and increases the risk of stroke. High blood pressure is more likely to develop in:  People who have blood pressure in the high end of the normal range (130-139/85-89 mm Hg).  People who are overweight or obese.  People who are African American.  If you are 18-39 years of age, have your blood pressure checked every 3-5 years. If you are 40 years of age or older, have your blood pressure checked  every year. You should have your blood pressure measured twice--once when you are at a hospital or clinic, and once when you are not at a hospital or clinic. Record the average of the two measurements. To check your blood pressure when you are not at a hospital or clinic, you can use:  An automated blood pressure machine at a pharmacy.  A home blood pressure monitor.  If you are between 55 years and 79 years old, ask your health care provider if you should take aspirin to prevent strokes.  Have regular diabetes screenings. This involves taking a blood sample to check your fasting blood sugar level.  If you are at a normal weight and have a low risk for diabetes, have this test once every three years after 57 years of age.  If you are overweight and have a high risk for diabetes, consider being tested at a younger age or more often. PREVENTING INFECTION  Hepatitis B  If you have a higher risk for hepatitis B, you should be screened for this virus. You are considered at high risk for hepatitis B if:  You were born in a country where hepatitis B is common. Ask your health care provider which countries are considered high risk.  Your parents were born in a high-risk country, and you have not been immunized against hepatitis B (hepatitis B vaccine).  You have HIV or AIDS.  You use needles to inject street drugs.  You live with someone who has hepatitis B.  You have had sex with someone who has hepatitis B.  You get hemodialysis treatment.  You take certain medicines for conditions, including cancer, organ transplantation, and autoimmune conditions. Hepatitis C  Blood testing is recommended for:  Everyone born from 1945 through 1965.  Anyone with known risk factors for hepatitis C. Sexually transmitted infections (STIs)  You should be screened for sexually transmitted infections (STIs) including gonorrhea and chlamydia if:  You are sexually active and are younger than 57 years  of age.  You are older than 57 years of age and your health care provider tells you that you are at risk for this type of infection.  Your sexual activity has changed since you were last screened and you are at an increased risk for chlamydia or gonorrhea. Ask your health care provider if you are at risk.  If you do not have HIV, but are at risk, it may be recommended that you take a prescription medicine daily to prevent HIV infection. This is called pre-exposure prophylaxis (PrEP). You are considered at risk   if:  You are sexually active and do not regularly use condoms or know the HIV status of your partner(s).  You take drugs by injection.  You are sexually active with a partner who has HIV. Talk with your health care provider about whether you are at high risk of being infected with HIV. If you choose to begin PrEP, you should first be tested for HIV. You should then be tested every 3 months for as long as you are taking PrEP.  PREGNANCY   If you are premenopausal and you may become pregnant, ask your health care provider about preconception counseling.  If you may become pregnant, take 400 to 800 micrograms (mcg) of folic acid every day.  If you want to prevent pregnancy, talk to your health care provider about birth control (contraception). OSTEOPOROSIS AND MENOPAUSE   Osteoporosis is a disease in which the bones lose minerals and strength with aging. This can result in serious bone fractures. Your risk for osteoporosis can be identified using a bone density scan.  If you are 65 years of age or older, or if you are at risk for osteoporosis and fractures, ask your health care provider if you should be screened.  Ask your health care provider whether you should take a calcium or vitamin D supplement to lower your risk for osteoporosis.  Menopause may have certain physical symptoms and risks.  Hormone replacement therapy may reduce some of these symptoms and risks. Talk to your  health care provider about whether hormone replacement therapy is right for you.  HOME CARE INSTRUCTIONS   Schedule regular health, dental, and eye exams.  Stay current with your immunizations.   Do not use any tobacco products including cigarettes, chewing tobacco, or electronic cigarettes.  If you are pregnant, do not drink alcohol.  If you are breastfeeding, limit how much and how often you drink alcohol.  Limit alcohol intake to no more than 1 drink per day for nonpregnant women. One drink equals 12 ounces of beer, 5 ounces of wine, or 1 ounces of hard liquor.  Do not use street drugs.  Do not share needles.  Ask your health care provider for help if you need support or information about quitting drugs.  Tell your health care provider if you often feel depressed.  Tell your health care provider if you have ever been abused or do not feel safe at home.   This information is not intended to replace advice given to you by your health care provider. Make sure you discuss any questions you have with your health care provider.   Document Released: 03/08/2011 Document Revised: 09/13/2014 Document Reviewed: 07/25/2013 Elsevier Interactive Patient Education 2016 [employer]  

## 2015-12-17 LAB — HEPATITIS C ANTIBODY: HCV Ab: NEGATIVE

## 2015-12-21 ENCOUNTER — Encounter: Payer: Self-pay | Admitting: Internal Medicine

## 2015-12-23 ENCOUNTER — Telehealth: Payer: Self-pay | Admitting: Emergency Medicine

## 2015-12-23 NOTE — Telephone Encounter (Signed)
Spoke with pt and Dr Dessie Coma' office. Have forwarded the fax we received from the insurance company in regards to pts surgery.

## 2015-12-29 ENCOUNTER — Other Ambulatory Visit: Payer: Self-pay | Admitting: Medical

## 2016-01-12 ENCOUNTER — Encounter: Payer: BC Managed Care – PPO | Admitting: Gastroenterology

## 2016-01-31 ENCOUNTER — Other Ambulatory Visit: Payer: Self-pay | Admitting: Medical

## 2016-03-16 HISTORY — PX: BREAST REDUCTION SURGERY: SHX8

## 2016-08-11 ENCOUNTER — Encounter: Payer: Self-pay | Admitting: Internal Medicine

## 2016-08-11 ENCOUNTER — Ambulatory Visit (INDEPENDENT_AMBULATORY_CARE_PROVIDER_SITE_OTHER): Payer: BC Managed Care – PPO | Admitting: Internal Medicine

## 2016-08-11 VITALS — BP 116/80 | HR 119 | Temp 98.1°F | Resp 16 | Wt 186.0 lb

## 2016-08-11 DIAGNOSIS — Z23 Encounter for immunization: Secondary | ICD-10-CM | POA: Diagnosis not present

## 2016-08-11 DIAGNOSIS — G479 Sleep disorder, unspecified: Secondary | ICD-10-CM | POA: Diagnosis not present

## 2016-08-11 DIAGNOSIS — R202 Paresthesia of skin: Secondary | ICD-10-CM

## 2016-08-11 MED ORDER — TRAZODONE HCL 50 MG PO TABS: 50.0000 mg | ORAL_TABLET | Freq: Every evening | ORAL | 3 refills | Status: DC | PRN

## 2016-08-11 NOTE — Progress Notes (Signed)
Subjective:    Patient ID: Helen Young, female    DOB: 02-13-1961, 55 y.o.   MRN: [id number]  HPI She is here for an acute visit.   Fatigue/ no energy:  She noticed this for the past couple weeks.  Her body feels like it shuts down around 2pm.  She is ok the rest of the day.  She is not sleepig well.  It does not matter what time she goes to sleep.  She wakes frequently.  She wakes early.  She denies major changes elesewhere in her life, but her 60 yo grandson is living with her which is more stress.  She has stress but she actually thinks it is better.  Ultimately, she thinks it is probaby stress that is causing this. She has not tried any otc medication and ideally does not want to take anything daily or long term.    Tingling in lower back:The past couple of weeks she has had a tingling sensation or adjusting sensation in her lower back. She has had intermittent tingling down her left leg. She denies any pain in the back or in the leg. She has not needed to take any pain medication. It is an uncomfortable feeling, but nonpainful. She has not had any sensation in the past couple of days. She denies any rashes.  If she lays on the floor and brings from used for chest does relieve this sensation normal.  She had a similar sensation in her shoulder and left arm and resume all after an injection in her neck. She had this problem at that time. She did have a tingling sensation in her leg during this episode, but also resolved after the injection. She has not had any recurrence since then.   Medications and allergies reviewed with patient and updated if appropriate.  Patient Active Problem List   Diagnosis Date Noted  . Numbness in left leg 07/16/2015  . Groin pain 07/16/2015  . Back pain 12/06/2014  . Obesity (BMI 30-39.9) 10/15/2013  . Recurrent ventral incisional hernia 02/09/2013  . Thoracic back pain 10/09/2012  . HTN (hypertension) 04/06/2012  . Scoliosis 04/06/2012  . Environmental  allergies 04/06/2012  . Postmenopausal 04/06/2012    Current Outpatient Prescriptions on File Prior to Visit  Medication Sig Dispense Refill  . amLODipine (NORVASC) 5 MG tablet TAKE 1 TABLET(5 MG) BY MOUTH DAILY 30 tablet 0  . cetirizine (ZYRTEC) 10 MG tablet Take 10 mg by mouth at bedtime.     Marland Kitchen ibuprofen (ADVIL,MOTRIN) 800 MG tablet Take 1 tablet (800 mg total) by mouth 3 (three) times daily. 21 tablet 0  . mometasone (NASONEX) 50 MCG/ACT nasal spray Place 2 sprays into the nose daily as needed (for allergies).     Marland Kitchen PREMARIN 0.625 MG tablet TAKE ONE TABLET BY MOUTH DAILY 30 tablet 5  . tetrahydrozoline 0.05 % ophthalmic solution Place 1 drop into both eyes daily as needed (for dry eyes).    . valsartan-hydrochlorothiazide (DIOVAN-HCT) 320-25 MG tablet Take 1 tablet by mouth daily. 90 tablet 3   No current facility-administered medications on file prior to visit.     Past Medical History:  Diagnosis Date  . Environmental allergies 02-27-13   OTC meds daily  . Headache(784.0) 02-27-13   occ. migraines  . HTN (hypertension)   . Transfusion history    childhood s/p Tonsillectomy    Past Surgical History:  Procedure Laterality Date  . ABDOMINAL HYSTERECTOMY    . CYSTOSTOMY N/A 03/02/2013  Procedure: CYSTOSTOMY CLOSURE ;  Surgeon: Alexis Frock, MD;  Location: [facility];  Service: Urology;  Laterality: N/A;  . INCISIONAL HERNIA REPAIR N/A 03/02/2013   Procedure: LAPAROSCOPIC INCISIONAL HERNIA CONVERTED TO OPEN ;  Surgeon: Harl Bowie, MD;  Location: [facility];  Service: General;  Laterality: N/A;  . INGUINAL HERNIA REPAIR     x's 2   . KNEE SURGERY Left    torn meniscus - arthroscopic repair  . NECK SURGERY     Disc replaced with a steel plate  . TONSILLECTOMY     childhoood  . TOTAL ABDOMINAL HYSTERECTOMY W/ BILATERAL SALPINGOOPHORECTOMY     prolapsed uterus at 14-32 years old, ovaries and tubes removed 10 years later due to cysts    Social History   Social History  .  Marital status: Widowed    Spouse name: N/A  . Number of children: 3  . Years of education: N/A   Occupational History  . [place] History Main Topics  . Smoking status: Former Smoker    Quit date: 09/06/2005  . Smokeless tobacco: Never Used  . Alcohol use 0.0 oz/week     Comment: Rarely  . Drug use: No  . Sexual activity: Yes   Other Topics Concern  . None   Social History Narrative   No regular exercise right now    Family History  Problem Relation Age of Onset  . Colon cancer Father   . Prostate cancer Father   . Heart disease Father   . Hypertension Father   . Heart disease Mother   . Stroke Mother   . Hypertension Mother   . Kidney disease Maternal Grandfather   . Diabetes Maternal Grandfather   . Sudden death Paternal Grandfather   . Sudden death Paternal Uncle     Cardiac issues    Review of Systems  Constitutional: Positive for fatigue. Negative for chills and fever.  Musculoskeletal: Negative for back pain.  Neurological: Positive for numbness. Negative for weakness.  Psychiatric/Behavioral: Positive for sleep disturbance.       Objective:   Vitals:   08/11/16 1115  BP: 116/80  Pulse: (!) 119  Resp: 16  Temp: 98.1 F (36.7 C)   Filed Weights   08/11/16 1115  Weight: 186 lb (84.4 kg)   Body mass index is 30.95 kg/m.   Physical Exam  Constitutional: She is oriented to person, place, and time. She appears well-developed and well-nourished. No distress.  Musculoskeletal: She exhibits no edema.  No tenderness in the lumbar region, no symptoms with extension or flexion of spine  Neurological: She is alert and oriented to person, place, and time.  Normal gait, normal sensation and strength lower extremities bilaterally  Skin: Skin is warm and dry. She is not diaphoretic.  Psychiatric: She has a normal mood and affect. Her behavior is normal. Judgment and thought content normal.          Assessment & Plan:     See Problem List for Assessment and Plan of chronic medical problems.

## 2016-08-11 NOTE — Patient Instructions (Signed)
If your tingling gets worse let me know and I can refer you to specialist.   Flu immunization administered today.   Medications reviewed and updated.  Changes include starting trazodone for sleep.   Your prescription(s) have been submitted to your pharmacy. Please take as directed and contact our office if you believe you are having problem(s) with the medication(s).

## 2016-08-11 NOTE — Assessment & Plan Note (Signed)
New problem over the past couple of weeks likely related to increased stress Discussed options-ultimately needs to work on stress or anxiety level Continue regular exercise Discussed melatonin-she will try this Discussed trazodone and she is interested in trying this. Hopefully this will restart her normal sleep schedule and she will not have to continue long-term She will start with half a tablet nightly and increase slowly as discussed to 1 full tablet and then 2 tablets if needed

## 2016-08-11 NOTE — Assessment & Plan Note (Signed)
Tingling sensation in her lower back and left leg for 2 weeks, asymptomatic for 2 days No pain or other concerning symptoms Likely a mildly pinched nerve Since symptoms are mild and have not been present for a couple of days no further evaluation at this time Encouraged stretching If her symptoms recur she can call and I can refer to sports medicine for further evaluation if she wishes

## 2016-08-11 NOTE — Progress Notes (Signed)
Pre visit review using our clinic review tool, if applicable. No additional management support is needed unless otherwise documented below in the visit note. 

## 2016-08-28 ENCOUNTER — Ambulatory Visit (INDEPENDENT_AMBULATORY_CARE_PROVIDER_SITE_OTHER): Payer: BC Managed Care – PPO | Admitting: Primary Care

## 2016-08-28 ENCOUNTER — Encounter: Payer: Self-pay | Admitting: Primary Care

## 2016-08-28 VITALS — BP 124/86 | HR 94 | Temp 99.0°F | Wt 183.0 lb

## 2016-08-28 DIAGNOSIS — J069 Acute upper respiratory infection, unspecified: Secondary | ICD-10-CM

## 2016-08-28 MED ORDER — BENZONATATE 200 MG PO CAPS: 200.0000 mg | ORAL_CAPSULE | Freq: Three times a day (TID) | ORAL | 0 refills | Status: DC | PRN

## 2016-08-28 NOTE — Progress Notes (Signed)
Subjective:    Patient ID: Helen Young, female    DOB: Sep 05, 1961, 55 y.o.   MRN: [id number]  HPI  Helen Young is a 55 year old female who presents today with a chief complaint of cough. She also reports sore throat, chest congestion. Her symptoms began 2-3 days ago. Her cough is worse at night. Her cough is mildly productive with clear sputum. She's been taking Mucinex DM with some improvement. She denies sick contacts, fevers, shortness of breath. She will be traveling today and is worried she may be getting ill.  Review of Systems  Constitutional: Negative for chills, fatigue and fever.  HENT: Positive for congestion, sinus pressure and sore throat. Negative for ear pain.   Respiratory: Positive for cough. Negative for shortness of breath.   Cardiovascular: Negative for chest pain.       Past Medical History:  Diagnosis Date  . Environmental allergies 02-27-13   OTC meds daily  . Headache(784.0) 02-27-13   occ. migraines  . HTN (hypertension)   . Transfusion history    childhood s/p Tonsillectomy     Social History   Social History  . Marital status: Widowed    Spouse name: N/A  . Number of children: 3  . Years of education: N/A   Occupational History  . [employer] History Main Topics  . Smoking status: Former Smoker    Quit date: 09/06/2005  . Smokeless tobacco: Never Used  . Alcohol use 0.0 oz/week     Comment: Rarely  . Drug use: No  . Sexual activity: Yes   Other Topics Concern  . Not on file   Social History Narrative   No regular exercise right now    Past Surgical History:  Procedure Laterality Date  . ABDOMINAL HYSTERECTOMY    . CYSTOSTOMY N/A 03/02/2013   Procedure: CYSTOSTOMY CLOSURE ;  Surgeon: Alexis Frock, MD;  Location: [facility];  Service: Urology;  Laterality: N/A;  . INCISIONAL HERNIA REPAIR N/A 03/02/2013   Procedure: LAPAROSCOPIC INCISIONAL HERNIA CONVERTED TO OPEN ;  Surgeon: Harl Bowie, MD;   Location: [facility];  Service: General;  Laterality: N/A;  . INGUINAL HERNIA REPAIR     x's 2   . KNEE SURGERY Left    torn meniscus - arthroscopic repair  . NECK SURGERY     Disc replaced with a steel plate  . TONSILLECTOMY     childhoood  . TOTAL ABDOMINAL HYSTERECTOMY W/ BILATERAL SALPINGOOPHORECTOMY     prolapsed uterus at 30-103 years old, ovaries and tubes removed 10 years later due to cysts    Family History  Problem Relation Age of Onset  . Colon cancer Father   . Prostate cancer Father   . Heart disease Father   . Hypertension Father   . Heart disease Mother   . Stroke Mother   . Hypertension Mother   . Kidney disease Maternal Grandfather   . Diabetes Maternal Grandfather   . Sudden death Paternal Grandfather   . Sudden death Paternal Uncle     Cardiac issues    Allergies  Allergen Reactions  . Fish Allergy Itching and Swelling  . Percocet [Oxycodone-Acetaminophen] Swelling    Current Outpatient Prescriptions on File Prior to Visit  Medication Sig Dispense Refill  . amLODipine (NORVASC) 5 MG tablet TAKE 1 TABLET(5 MG) BY MOUTH DAILY 30 tablet 0  . cetirizine (ZYRTEC) 10 MG tablet Take 10 mg by mouth at bedtime.     Marland Kitchen  ibuprofen (ADVIL,MOTRIN) 800 MG tablet Take 1 tablet (800 mg total) by mouth 3 (three) times daily. 21 tablet 0  . mometasone (NASONEX) 50 MCG/ACT nasal spray Place 2 sprays into the nose daily as needed (for allergies).     Marland Kitchen PREMARIN 0.625 MG tablet TAKE ONE TABLET BY MOUTH DAILY 30 tablet 5  . tetrahydrozoline 0.05 % ophthalmic solution Place 1 drop into both eyes daily as needed (for dry eyes).    . traZODone (DESYREL) 50 MG tablet Take 1-2 tablets (50-100 mg total) by mouth at bedtime as needed for sleep. 60 tablet 3  . valsartan-hydrochlorothiazide (DIOVAN-HCT) 320-25 MG tablet Take 1 tablet by mouth daily. 90 tablet 3   No current facility-administered medications on file prior to visit.     BP 124/86 (BP Location: Right Arm, Patient Position:  Sitting, Cuff Size: Normal)   Pulse 94   Temp 99 F (37.2 C) (Oral)   Wt 183 lb (83 kg)   SpO2 98%   BMI 30.45 kg/m    Objective:   Physical Exam  Constitutional: She appears well-nourished. She does not appear ill.  HENT:  Right Ear: Tympanic membrane and ear canal normal.  Left Ear: Tympanic membrane and ear canal normal.  Nose: Right sinus exhibits no maxillary sinus tenderness and no frontal sinus tenderness. Left sinus exhibits no maxillary sinus tenderness and no frontal sinus tenderness.  Mouth/Throat: Oropharynx is clear and moist.  Eyes: Conjunctivae are normal.  Neck: Neck supple.  Cardiovascular: Normal rate and regular rhythm.   Pulmonary/Chest: Effort normal. She has no wheezes. She has rales in the right upper field and the left upper field.  Mild rales/congestion to bilateral upper fields, clears with cough.  Lymphadenopathy:    She has no cervical adenopathy.  Skin: Skin is warm and dry.          Assessment & Plan:  URI:  Cough, congestion, sore throat x 2-3 days. Some improvement with Mucinex OTC. Exam today with mostly clear lungs, minor upper congestion, does not appear acutely ill, stable vitals. Suspect viral URI and will treat with supportive measures.  Rx for Tessalon Pearls sent to pharmacy. Discussed to continue Mucinex, fluids, rest. Return precautions provided.  Sheral Flow, NP

## 2016-08-28 NOTE — Patient Instructions (Signed)
Your symptoms are representative of a viral illness which will resolve on its own over time. Our goal is to treat your symptoms in order to aid your body in the healing process and to make you more comfortable.   You may take Benzonatate capsules for cough. Take 1 capsule by mouth three times daily as needed for cough.  Continue Mucinex for chest congestion. Ensure you are staying hydrated with water, especially when taking the Mucinex to help break up the congestion.   Please notify me if you develop persistent fevers of 101, start coughing up green mucous, notice increased fatigue or weakness, or feel worse after 1 week of onset of symptoms.   It was a pleasure meeting you! Safe travels!   Upper Respiratory Infection, Adult Most upper respiratory infections (URIs) are a viral infection of the air passages leading to the lungs. A URI affects the nose, throat, and upper air passages. The most common type of URI is nasopharyngitis and is typically referred to as "the common cold." URIs run their course and usually go away on their own. Most of the time, a URI does not require medical attention, but sometimes a bacterial infection in the upper airways can follow a viral infection. This is called a secondary infection. Sinus and middle ear infections are common types of secondary upper respiratory infections. Bacterial pneumonia can also complicate a URI. A URI can worsen asthma and chronic obstructive pulmonary disease (COPD). Sometimes, these complications can require emergency medical care and may be life threatening. What are the causes? Almost all URIs are caused by viruses. A virus is a type of germ and can spread from one person to another. What increases the risk? You may be at risk for a URI if:  You smoke.  You have chronic heart or lung disease.  You have a weakened defense (immune) system.  You are very young or very old.  You have nasal allergies or asthma.  You work in  crowded or poorly ventilated areas.  You work in health care facilities or schools. What are the signs or symptoms? Symptoms typically develop 2-3 days after you come in contact with a cold virus. Most viral URIs last 7-10 days. However, viral URIs from the influenza virus (flu virus) can last 14-18 days and are typically more severe. Symptoms may include:  Runny or stuffy (congested) nose.  Sneezing.  Cough.  Sore throat.  Headache.  Fatigue.  Fever.  Loss of appetite.  Pain in your forehead, behind your eyes, and over your cheekbones (sinus pain).  Muscle aches. How is this diagnosed? Your health care provider may diagnose a URI by:  Physical exam.  Tests to check that your symptoms are not due to another condition such as:  Strep throat.  Sinusitis.  Pneumonia.  Asthma. How is this treated? A URI goes away on its own with time. It cannot be cured with medicines, but medicines may be prescribed or recommended to relieve symptoms. Medicines may help:  Reduce your fever.  Reduce your cough.  Relieve nasal congestion. Follow these instructions at home:  Take medicines only as directed by your health care provider.  Gargle warm saltwater or take cough drops to comfort your throat as directed by your health care provider.  Use a warm mist humidifier or inhale steam from a shower to increase air moisture. This may make it easier to breathe.  Drink enough fluid to keep your urine clear or pale yellow.  Eat soups and other  clear broths and maintain good nutrition.  Rest as needed.  Return to work when your temperature has returned to normal or as your health care provider advises. You may need to stay home longer to avoid infecting others. You can also use a face mask and careful hand washing to prevent spread of the virus.  Increase the usage of your inhaler if you have asthma.  Do not use any tobacco products, including cigarettes, chewing tobacco, or  electronic cigarettes. If you need help quitting, ask your health care provider. How is this prevented? The best way to protect yourself from getting a cold is to practice good hygiene.  Avoid oral or hand contact with people with cold symptoms.  Wash your hands often if contact occurs. There is no clear evidence that vitamin C, vitamin E, echinacea, or exercise reduces the chance of developing a cold. However, it is always recommended to get plenty of rest, exercise, and practice good nutrition. Contact a health care provider if:  You are getting worse rather than better.  Your symptoms are not controlled by medicine.  You have chills.  You have worsening shortness of breath.  You have brown or red mucus.  You have yellow or brown nasal discharge.  You have pain in your face, especially when you bend forward.  You have a fever.  You have swollen neck glands.  You have pain while swallowing.  You have white areas in the back of your throat. Get help right away if:  You have severe or persistent:  Headache.  Ear pain.  Sinus pain.  Chest pain.  You have chronic lung disease and any of the following:  Wheezing.  Prolonged cough.  Coughing up blood.  A change in your usual mucus.  You have a stiff neck.  You have changes in your:  Vision.  Hearing.  Thinking.  Mood. This information is not intended to replace advice given to you by your health care provider. Make sure you discuss any questions you have with your health care provider. Document Released: 02/16/2001 Document Revised: 04/25/2016 Document Reviewed: 11/28/2013 Elsevier Interactive Patient Education  2017 [employer].

## 2016-08-28 NOTE — Progress Notes (Signed)
Pre visit review using our clinic review tool, if applicable. No additional management support is needed unless otherwise documented below in the visit note. 

## 2016-10-23 ENCOUNTER — Encounter: Payer: Self-pay | Admitting: Adult Health

## 2016-10-23 ENCOUNTER — Ambulatory Visit (INDEPENDENT_AMBULATORY_CARE_PROVIDER_SITE_OTHER): Payer: BC Managed Care – PPO | Admitting: Adult Health

## 2016-10-23 VITALS — BP 110/82 | HR 91 | Temp 98.6°F | Resp 16 | Ht 65.0 in | Wt 188.0 lb

## 2016-10-23 DIAGNOSIS — T148XXA Other injury of unspecified body region, initial encounter: Secondary | ICD-10-CM

## 2016-10-23 MED ORDER — CYCLOBENZAPRINE HCL 10 MG PO TABS: 10.0000 mg | ORAL_TABLET | Freq: Every day | ORAL | 0 refills | Status: DC

## 2016-10-23 NOTE — Progress Notes (Signed)
Subjective:    Patient ID: Helen Young, female    DOB: 17-Sep-1960, 56 y.o.   MRN: [id number]  HPI  56 year old female who presents to Saturday clinic with 24 hours of left sided shoulder pain. The pain started when she woke up yesterday morning. The pain is described as sharp and pulling when she performs movements. She has full ROM of her neck. Denies any headaches, blurred vision, radiating pain down her left arm or up jaw line.   She has been using Tylenol Arthritis with some relief.   Denies any trauma   Review of Systems  All other systems reviewed and are negative.   See HPI  Past Medical History:  Diagnosis Date  . Environmental allergies 02-27-13   OTC meds daily  . Headache(784.0) 02-27-13   occ. migraines  . HTN (hypertension)   . Transfusion history    childhood s/p Tonsillectomy    Social History   Social History  . Marital status: Widowed    Spouse name: N/A  . Number of children: 3  . Years of education: N/A   Occupational History  . [employer] History Main Topics  . Smoking status: Former Smoker    Quit date: 09/06/2005  . Smokeless tobacco: Never Used  . Alcohol use 0.0 oz/week     Comment: Rarely  . Drug use: No  . Sexual activity: Yes   Other Topics Concern  . Not on file   Social History Narrative   No regular exercise right now    Past Surgical History:  Procedure Laterality Date  . ABDOMINAL HYSTERECTOMY    . CYSTOSTOMY N/A 03/02/2013   Procedure: CYSTOSTOMY CLOSURE ;  Surgeon: Alexis Frock, MD;  Location: [facility];  Service: Urology;  Laterality: N/A;  . INCISIONAL HERNIA REPAIR N/A 03/02/2013   Procedure: LAPAROSCOPIC INCISIONAL HERNIA CONVERTED TO OPEN ;  Surgeon: Harl Bowie, MD;  Location: [facility];  Service: General;  Laterality: N/A;  . INGUINAL HERNIA REPAIR     x's 2   . KNEE SURGERY Left    torn meniscus - arthroscopic repair  . NECK SURGERY     Disc replaced with a steel plate  .  TONSILLECTOMY     childhoood  . TOTAL ABDOMINAL HYSTERECTOMY W/ BILATERAL SALPINGOOPHORECTOMY     prolapsed uterus at 47-81 years old, ovaries and tubes removed 10 years later due to cysts    Family History  Problem Relation Age of Onset  . Colon cancer Father   . Prostate cancer Father   . Heart disease Father   . Hypertension Father   . Heart disease Mother   . Stroke Mother   . Hypertension Mother   . Kidney disease Maternal Grandfather   . Diabetes Maternal Grandfather   . Sudden death Paternal Grandfather   . Sudden death Paternal Uncle     Cardiac issues    Allergies  Allergen Reactions  . Fish Allergy Itching and Swelling  . Percocet [Oxycodone-Acetaminophen] Swelling    Current Outpatient Prescriptions on File Prior to Visit  Medication Sig Dispense Refill  . amLODipine (NORVASC) 5 MG tablet TAKE 1 TABLET(5 MG) BY MOUTH DAILY 30 tablet 0  . benzonatate (TESSALON) 200 MG capsule Take 1 capsule (200 mg total) by mouth 3 (three) times daily as needed for cough. 30 capsule 0  . cetirizine (ZYRTEC) 10 MG tablet Take 10 mg by mouth at bedtime.     Marland Kitchen ibuprofen (  ADVIL,MOTRIN) 800 MG tablet Take 1 tablet (800 mg total) by mouth 3 (three) times daily. 21 tablet 0  . mometasone (NASONEX) 50 MCG/ACT nasal spray Place 2 sprays into the nose daily as needed (for allergies).     Marland Kitchen PREMARIN 0.625 MG tablet TAKE ONE TABLET BY MOUTH DAILY 30 tablet 5  . tetrahydrozoline 0.05 % ophthalmic solution Place 1 drop into both eyes daily as needed (for dry eyes).    . traZODone (DESYREL) 50 MG tablet Take 1-2 tablets (50-100 mg total) by mouth at bedtime as needed for sleep. 60 tablet 3  . valsartan-hydrochlorothiazide (DIOVAN-HCT) 320-25 MG tablet Take 1 tablet by mouth daily. 90 tablet 3   No current facility-administered medications on file prior to visit.     BP 110/82 (BP Location: Left Arm, Patient Position: Sitting, Cuff Size: Large)   Pulse 91   Temp 98.6 F (37 C) (Oral)   Resp  16   Ht 5\' 5"  (1.651 m)   Wt 188 lb (85.3 kg)   SpO2 97%   BMI 31.28 kg/m       Objective:   Physical Exam  Constitutional: She is oriented to person, place, and time. She appears well-developed and well-nourished. No distress.  Cardiovascular: Normal rate, regular rhythm, normal heart sounds and intact distal pulses.  Exam reveals no gallop and no friction rub.   No murmur heard. Pulmonary/Chest: Effort normal and breath sounds normal. No respiratory distress. She has no wheezes. She has no rales. She exhibits no tenderness.  Musculoskeletal: Normal range of motion. She exhibits tenderness.  Inflamed trapezius with pain with palpation around trapezius and scapula   Neurological: She is alert and oriented to person, place, and time. She has normal reflexes.  Skin: Skin is warm and dry. No rash noted. She is not diaphoretic. No erythema. No pallor.  Nursing note and vitals reviewed.     Assessment & Plan:  1. Muscle strain - cyclobenzaprine (FLEXERIL) 10 MG tablet; Take 1 tablet (10 mg total) by mouth at bedtime.  Dispense: 15 tablet; Refill: 0 - Advised Motrin 600mg  Q8 H and heating pad  - Follow up with PCP if no improvement  Dorothyann Peng, NP

## 2016-10-23 NOTE — Progress Notes (Signed)
Pre visit review using our clinic review tool, if applicable. No additional management support is needed unless otherwise documented below in the visit note. 

## 2016-11-10 ENCOUNTER — Encounter: Payer: Self-pay | Admitting: Gastroenterology

## 2016-12-01 ENCOUNTER — Ambulatory Visit (INDEPENDENT_AMBULATORY_CARE_PROVIDER_SITE_OTHER): Payer: BC Managed Care – PPO | Admitting: Internal Medicine

## 2016-12-01 ENCOUNTER — Encounter: Payer: Self-pay | Admitting: Internal Medicine

## 2016-12-01 ENCOUNTER — Other Ambulatory Visit (INDEPENDENT_AMBULATORY_CARE_PROVIDER_SITE_OTHER): Payer: BC Managed Care – PPO

## 2016-12-01 VITALS — BP 114/84 | HR 97 | Temp 98.1°F | Resp 16 | Ht 65.0 in | Wt 189.0 lb

## 2016-12-01 DIAGNOSIS — I10 Essential (primary) hypertension: Secondary | ICD-10-CM

## 2016-12-01 DIAGNOSIS — G479 Sleep disorder, unspecified: Secondary | ICD-10-CM | POA: Diagnosis not present

## 2016-12-01 DIAGNOSIS — Z0000 Encounter for general adult medical examination without abnormal findings: Secondary | ICD-10-CM | POA: Diagnosis not present

## 2016-12-01 DIAGNOSIS — E559 Vitamin D deficiency, unspecified: Secondary | ICD-10-CM

## 2016-12-01 LAB — COMPREHENSIVE METABOLIC PANEL
ALT: 12 U/L (ref 0–35)
AST: 15 U/L (ref 0–37)
Albumin: 4.2 g/dL (ref 3.5–5.2)
Alkaline Phosphatase: 46 U/L (ref 39–117)
BILIRUBIN TOTAL: 0.5 mg/dL (ref 0.2–1.2)
BUN: 12 mg/dL (ref 6–23)
CALCIUM: 9.4 mg/dL (ref 8.4–10.5)
CO2: 30 meq/L (ref 19–32)
CREATININE: 0.8 mg/dL (ref 0.40–1.20)
Chloride: 102 mEq/L (ref 96–112)
GFR: 94.96 mL/min (ref >60.00)
GLUCOSE: 85 mg/dL (ref 70–99)
Potassium: 3.9 mEq/L (ref 3.5–5.1)
Sodium: 139 mEq/L (ref 135–145)
Total Protein: 7.2 g/dL (ref 6.0–8.3)

## 2016-12-01 LAB — LIPID PANEL
CHOL/HDL RATIO: 4
Cholesterol: 198 mg/dL (ref 0–200)
HDL: 51.9 mg/dL (ref >39.00)
LDL Cholesterol: 129 mg/dL — ABNORMAL HIGH (ref 0–99)
NONHDL: 146.3
Triglycerides: 87 mg/dL (ref 0.0–149.0)
VLDL: 17.4 mg/dL (ref 0.0–40.0)

## 2016-12-01 LAB — CBC WITH DIFFERENTIAL/PLATELET
BASOS ABS: 0.1 10*3/uL (ref 0.0–0.1)
Basophils Relative: 1 % (ref 0.0–3.0)
Eosinophils Absolute: 0.1 10*3/uL (ref 0.0–0.7)
Eosinophils Relative: 2.1 % (ref 0.0–5.0)
HEMATOCRIT: 38.9 % (ref 36.0–46.0)
Hemoglobin: 12.8 g/dL (ref 12.0–15.0)
LYMPHS PCT: 44.4 % (ref 12.0–46.0)
Lymphs Abs: 2.6 10*3/uL (ref 0.7–4.0)
MCHC: 32.8 g/dL (ref 30.0–36.0)
MCV: 89.3 fl (ref 78.0–100.0)
MONOS PCT: 7.3 % (ref 3.0–12.0)
Monocytes Absolute: 0.4 10*3/uL (ref 0.1–1.0)
Neutro Abs: 2.7 10*3/uL (ref 1.4–7.7)
Neutrophils Relative %: 45.2 % (ref 43.0–77.0)
Platelets: 369 10*3/uL (ref 150.0–400.0)
RBC: 4.36 Mil/uL (ref 3.87–5.11)
RDW: 12 % (ref 11.5–15.5)
WBC: 5.9 10*3/uL (ref 4.0–10.5)

## 2016-12-01 LAB — VITAMIN D 25 HYDROXY (VIT D DEFICIENCY, FRACTURES): VITD: 24.98 ng/mL — ABNORMAL LOW (ref 30.00–100.00)

## 2016-12-01 LAB — TSH: TSH: 0.5 u[IU]/mL (ref 0.35–4.50)

## 2016-12-01 MED ORDER — VALSARTAN-HYDROCHLOROTHIAZIDE 320-12.5 MG PO TABS: 1.0000 | ORAL_TABLET | Freq: Every day | ORAL | 3 refills | Status: DC

## 2016-12-01 NOTE — Patient Instructions (Addendum)
An EKG was done today.  Test(s) ordered today. Your results will be released to [facility] (or called to you) after review, usually within 72hours after test completion. If any changes need to be made, you will be notified at that same time.  All other Health Maintenance issues reviewed.   All recommended immunizations and age-appropriate screenings are up-to-date or discussed.  No immunizations administered today.   Medications reviewed and updated.  Changes include decreasing diovan-hctz to 320-12.5 mg daily.  Continue to monitor your BP at home.   Your prescription(s) have been submitted to your pharmacy. Please take as directed and contact our office if you believe you are having problem(s) with the medication(s).   Please followup in one year for a physical, sooner if needed   Health Maintenance, Female Adopting a healthy lifestyle and getting preventive care can go a long way to promote health and wellness. Talk with your health care provider about what schedule of regular examinations is right for you. This is a good chance for you to check in with your provider about disease prevention and staying healthy. In between checkups, there are plenty of things you can do on your own. Experts have done a lot of research about which lifestyle changes and preventive measures are most likely to keep you healthy. Ask your health care provider for more information. Weight and diet Eat a healthy diet  Be sure to include plenty of vegetables, fruits, low-fat dairy products, and lean protein.  Do not eat a lot of foods high in solid fats, added sugars, or salt.  Get regular exercise. This is one of the most important things you can do for your health.  Most adults should exercise for at least 150 minutes each week. The exercise should increase your heart rate and make you sweat (moderate-intensity exercise).  Most adults should also do strengthening exercises at least twice a week. This is in  addition to the moderate-intensity exercise. Maintain a healthy weight  Body mass index (BMI) is a measurement that can be used to identify possible weight problems. It estimates body fat based on height and weight. Your health care provider can help determine your BMI and help you achieve or maintain a healthy weight.  For females 41 years of age and older:  A BMI below 18.5 is considered underweight.  A BMI of 18.5 to 24.9 is normal.  A BMI of 25 to 29.9 is considered overweight.  A BMI of 30 and above is considered obese. Watch levels of cholesterol and blood lipids  You should start having your blood tested for lipids and cholesterol at 58 years of age, then have this test every 5 years.  You may need to have your cholesterol levels checked more often if:  Your lipid or cholesterol levels are high.  You are older than 58 years of age.  You are at high risk for heart disease. Cancer screening Lung Cancer  Lung cancer screening is recommended for adults 74-86 years old who are at high risk for lung cancer because of a history of smoking.  A yearly low-dose CT scan of the lungs is recommended for people who:  Currently smoke.  Have quit within the past 15 years.  Have at least a 30-pack-year history of smoking. A pack year is smoking an average of one pack of cigarettes a day for 1 year.  Yearly screening should continue until it has been 15 years since you quit.  Yearly screening should stop if you  develop a health problem that would prevent you from having lung cancer treatment. Breast Cancer  Practice breast self-awareness. This means understanding how your breasts normally appear and feel.  It also means doing regular breast self-exams. Let your health care provider know about any changes, no matter how small.  If you are in your 20s or 30s, you should have a clinical breast exam (CBE) by a health care provider every 1-3 years as part of a regular health  exam.  If you are 47 or older, have a CBE every year. Also consider having a breast X-ray (mammogram) every year.  If you have a family history of breast cancer, talk to your health care provider about genetic screening.  If you are at high risk for breast cancer, talk to your health care provider about having an MRI and a mammogram every year.  Breast cancer gene (BRCA) assessment is recommended for women who have family members with BRCA-related cancers. BRCA-related cancers include:  Breast.  Ovarian.  Tubal.  Peritoneal cancers.  Results of the assessment will determine the need for genetic counseling and BRCA1 and BRCA2 testing. Cervical Cancer  Your health care provider may recommend that you be screened regularly for cancer of the pelvic organs (ovaries, uterus, and vagina). This screening involves a pelvic examination, including checking for microscopic changes to the surface of your cervix (Pap test). You may be encouraged to have this screening done every 3 years, beginning at age 7.  For women ages 60-65, health care providers may recommend pelvic exams and Pap testing every 3 years, or they may recommend the Pap and pelvic exam, combined with testing for human papilloma virus (HPV), every 5 years. Some types of HPV increase your risk of cervical cancer. Testing for HPV may also be done on women of any age with unclear Pap test results.  Other health care providers may not recommend any screening for nonpregnant women who are considered low risk for pelvic cancer and who do not have symptoms. Ask your health care provider if a screening pelvic exam is right for you.  If you have had past treatment for cervical cancer or a condition that could lead to cancer, you need Pap tests and screening for cancer for at least 20 years after your treatment. If Pap tests have been discontinued, your risk factors (such as having a new sexual partner) need to be reassessed to determine if  screening should resume. Some women have medical problems that increase the chance of getting cervical cancer. In these cases, your health care provider may recommend more frequent screening and Pap tests. Colorectal Cancer  This type of cancer can be detected and often prevented.  Routine colorectal cancer screening usually begins at 58 years of age and continues through 58 years of age.  Your health care provider may recommend screening at an earlier age if you have risk factors for colon cancer.  Your health care provider may also recommend using home test kits to check for hidden blood in the stool.  A small camera at the end of a tube can be used to examine your colon directly (sigmoidoscopy or colonoscopy). This is done to check for the earliest forms of colorectal cancer.  Routine screening usually begins at age 60.  Direct examination of the colon should be repeated every 5-10 years through 58 years of age. However, you may need to be screened more often if early forms of precancerous polyps or small growths are found. Skin Cancer  Check your skin from head to toe regularly.  Tell your health care provider about any new moles or changes in moles, especially if there is a change in a mole's shape or color.  Also tell your health care provider if you have a mole that is larger than the size of a pencil eraser.  Always use sunscreen. Apply sunscreen liberally and repeatedly throughout the day.  Protect yourself by wearing long sleeves, pants, a wide-brimmed hat, and sunglasses whenever you are outside. Heart disease, diabetes, and high blood pressure  High blood pressure causes heart disease and increases the risk of stroke. High blood pressure is more likely to develop in:  People who have blood pressure in the high end of the normal range (130-139/85-89 mm Hg).  People who are overweight or obese.  People who are African American.  If you are 36-39 years of age, have your  blood pressure checked every 3-5 years. If you are 91 years of age or older, have your blood pressure checked every year. You should have your blood pressure measured twice-once when you are at a hospital or clinic, and once when you are not at a hospital or clinic. Record the average of the two measurements. To check your blood pressure when you are not at a hospital or clinic, you can use:  An automated blood pressure machine at a pharmacy.  A home blood pressure monitor.  If you are between 63 years and 40 years old, ask your health care provider if you should take aspirin to prevent strokes.  Have regular diabetes screenings. This involves taking a blood sample to check your fasting blood sugar level.  If you are at a normal weight and have a low risk for diabetes, have this test once every three years after 58 years of age.  If you are overweight and have a high risk for diabetes, consider being tested at a younger age or more often. Preventing infection Hepatitis B  If you have a higher risk for hepatitis B, you should be screened for this virus. You are considered at high risk for hepatitis B if:  You were born in a country where hepatitis B is common. Ask your health care provider which countries are considered high risk.  Your parents were born in a high-risk country, and you have not been immunized against hepatitis B (hepatitis B vaccine).  You have HIV or AIDS.  You use needles to inject street drugs.  You live with someone who has hepatitis B.  You have had sex with someone who has hepatitis B.  You get hemodialysis treatment.  You take certain medicines for conditions, including cancer, organ transplantation, and autoimmune conditions. Hepatitis C  Blood testing is recommended for:  Everyone born from [birth year] through 1967.  Anyone with known risk factors for hepatitis C. Sexually transmitted infections (STIs)  You should be screened for sexually transmitted  infections (STIs) including gonorrhea and chlamydia if:  You are sexually active and are younger than 58 years of age.  You are older than 58 years of age and your health care provider tells you that you are at risk for this type of infection.  Your sexual activity has changed since you were last screened and you are at an increased risk for chlamydia or gonorrhea. Ask your health care provider if you are at risk.  If you do not have HIV, but are at risk, it may be recommended that you take a prescription medicine daily to  prevent HIV infection. This is called pre-exposure prophylaxis (PrEP). You are considered at risk if:  You are sexually active and do not regularly use condoms or know the HIV status of your partner(s).  You take drugs by injection.  You are sexually active with a partner who has HIV. Talk with your health care provider about whether you are at high risk of being infected with HIV. If you choose to begin PrEP, you should first be tested for HIV. You should then be tested every 3 months for as long as you are taking PrEP. Pregnancy  If you are premenopausal and you may become pregnant, ask your health care provider about preconception counseling.  If you may become pregnant, take 400 to 800 micrograms (mcg) of folic acid every day.  If you want to prevent pregnancy, talk to your health care provider about birth control (contraception). Osteoporosis and menopause  Osteoporosis is a disease in which the bones lose minerals and strength with aging. This can result in serious bone fractures. Your risk for osteoporosis can be identified using a bone density scan.  If you are 33 years of age or older, or if you are at risk for osteoporosis and fractures, ask your health care provider if you should be screened.  Ask your health care provider whether you should take a calcium or vitamin D supplement to lower your risk for osteoporosis.  Menopause may have certain physical  symptoms and risks.  Hormone replacement therapy may reduce some of these symptoms and risks. Talk to your health care provider about whether hormone replacement therapy is right for you. Follow these instructions at home:  Schedule regular health, dental, and eye exams.  Stay current with your immunizations.  Do not use any tobacco products including cigarettes, chewing tobacco, or electronic cigarettes.  If you are pregnant, do not drink alcohol.  If you are breastfeeding, limit how much and how often you drink alcohol.  Limit alcohol intake to no more than 1 drink per day for nonpregnant women. One drink equals 12 ounces of beer, 5 ounces of wine, or 1 ounces of hard liquor.  Do not use street drugs.  Do not share needles.  Ask your health care provider for help if you need support or information about quitting drugs.  Tell your health care provider if you often feel depressed.  Tell your health care provider if you have ever been abused or do not feel safe at home. This information is not intended to replace advice given to you by your health care provider. Make sure you discuss any questions you have with your health care provider. Document Released: 03/08/2011 Document Revised: 01/29/2016 Document Reviewed: 05/27/2015 Elsevier Interactive Patient Education  2017 [employer].

## 2016-12-01 NOTE — Assessment & Plan Note (Signed)
BP well controlled Will decreased diovan-hctz from 320-25 mg to 320-12.5 mg daily Continue regular exercise Work on weight loss Monitor BP at home cmp

## 2016-12-01 NOTE — Progress Notes (Addendum)
Subjective:    Patient ID: Helen Young, female    DOB: 10-27-1960, 56 y.o.   MRN: [id number]  HPI She is here for a physical exam.   BP at home less than 546 systolically consistently and she would like to decrease her medication.  She is [employer], trainer, and cardio.   She lost her job and is currently looking for a new one.  She is doing well and denies anxiety regarding the situation.   Medications and allergies reviewed with patient and updated if appropriate.  Patient Active Problem List   Diagnosis Date Noted  . Tingling sensation 08/11/2016  . Sleep difficulties 08/11/2016  . Numbness in left leg 07/16/2015  . Groin pain 07/16/2015  . Back pain 12/06/2014  . Obesity (BMI 30-39.9) 10/15/2013  . Recurrent ventral incisional hernia 02/09/2013  . Thoracic back pain 10/09/2012  . HTN (hypertension) 04/06/2012  . Scoliosis 04/06/2012  . Environmental allergies 04/06/2012  . Postmenopausal 04/06/2012    Current Outpatient Prescriptions on File Prior to Visit  Medication Sig Dispense Refill  . amLODipine (NORVASC) 5 MG tablet TAKE 1 TABLET(5 MG) BY MOUTH DAILY 30 tablet 0  . cetirizine (ZYRTEC) 10 MG tablet Take 10 mg by mouth at bedtime.     Marland Kitchen ibuprofen (ADVIL,MOTRIN) 800 MG tablet Take 1 tablet (800 mg total) by mouth 3 (three) times daily. 21 tablet 0  . PREMARIN 0.625 MG tablet TAKE ONE TABLET BY MOUTH DAILY 30 tablet 5  . tetrahydrozoline 0.05 % ophthalmic solution Place 1 drop into both eyes daily as needed (for dry eyes).    . traZODone (DESYREL) 50 MG tablet Take 1-2 tablets (50-100 mg total) by mouth at bedtime as needed for sleep. 60 tablet 3  . valsartan-hydrochlorothiazide (DIOVAN-HCT) 320-25 MG tablet Take 1 tablet by mouth daily. 90 tablet 3   No current facility-administered medications on file prior to visit.     Past Medical History:  Diagnosis Date  . Environmental allergies 02-27-13   OTC meds daily  . Headache(784.0) 02-27-13   occ. migraines   . HTN (hypertension)   . Transfusion history    childhood s/p Tonsillectomy    Past Surgical History:  Procedure Laterality Date  . ABDOMINAL HYSTERECTOMY    . CYSTOSTOMY N/A 03/02/2013   Procedure: CYSTOSTOMY CLOSURE ;  Surgeon: Alexis Frock, MD;  Location: [facility];  Service: Urology;  Laterality: N/A;  . INCISIONAL HERNIA REPAIR N/A 03/02/2013   Procedure: LAPAROSCOPIC INCISIONAL HERNIA CONVERTED TO OPEN ;  Surgeon: Harl Bowie, MD;  Location: [facility];  Service: General;  Laterality: N/A;  . INGUINAL HERNIA REPAIR     x's 2   . KNEE SURGERY Left    torn meniscus - arthroscopic repair  . NECK SURGERY     Disc replaced with a steel plate  . TONSILLECTOMY     childhoood  . TOTAL ABDOMINAL HYSTERECTOMY W/ BILATERAL SALPINGOOPHORECTOMY     prolapsed uterus at 58-68 years old, ovaries and tubes removed 10 years later due to cysts    Social History   Social History  . Marital status: Widowed    Spouse name: N/A  . Number of children: 3  . Years of education: N/A   Occupational History  . [place] History Main Topics  . Smoking status: Former Smoker    Quit date: 09/06/2005  . Smokeless tobacco: Never Used  . Alcohol use 0.0 oz/week     Comment:  Rarely  . Drug use: No  . Sexual activity: Yes   Other Topics Concern  . None   Social History Narrative   No regular exercise right now    Family History  Problem Relation Age of Onset  . Colon cancer Father   . Prostate cancer Father   . Heart disease Father   . Hypertension Father   . Heart disease Mother   . Stroke Mother   . Hypertension Mother   . Kidney disease Maternal Grandfather   . Diabetes Maternal Grandfather   . Sudden death Paternal Grandfather   . Sudden death Paternal Uncle     Cardiac issues    Review of Systems  Constitutional: Negative for chills and fever.  Eyes: Negative for visual disturbance.  Respiratory: Negative for cough, shortness of breath and  wheezing.   Cardiovascular: Negative for chest pain, palpitations and leg swelling.  Gastrointestinal: Negative for abdominal pain, blood in stool, constipation, diarrhea and nausea.       No gerd  Genitourinary: Negative for dysuria and hematuria.  Musculoskeletal: Positive for arthralgias (left knee - ice prn).  Skin: Negative for color change and rash.  Neurological: Negative for light-headedness and headaches.  Psychiatric/Behavioral: Negative for dysphoric mood. The patient is not nervous/anxious.        Objective:   Vitals:   12/01/16 1007  BP: 114/84  Pulse: 97  Resp: 16  Temp: 98.1 F (36.7 C)   Filed Weights   12/01/16 1007  Weight: 189 lb (85.7 kg)   Body mass index is 31.45 kg/m.  Wt Readings from Last 3 Encounters:  12/01/16 189 lb (85.7 kg)  10/23/16 188 lb (85.3 kg)  08/28/16 183 lb (83 kg)     Physical Exam Constitutional: She appears well-developed and well-nourished. No distress.  HENT:  Head: Normocephalic and atraumatic.  Right Ear: External ear normal. Normal ear canal and TM Left Ear: External ear normal.  Normal ear canal and TM Mouth/Throat: Oropharynx is clear and moist.  Eyes: Conjunctivae and EOM are normal.  Neck: Neck supple. No tracheal deviation present. No thyromegaly present.  No carotid bruit  Cardiovascular: Normal rate, regular rhythm and normal heart sounds.   No murmur heard.  No edema. Pulmonary/Chest: Effort normal and breath sounds normal. No respiratory distress. She has no wheezes. She has no rales.  Breast: deferred to Gyn Abdominal: Soft. She exhibits no distension. There is no tenderness.  Lymphadenopathy: She has no cervical adenopathy.  Skin: Skin is warm and dry. She is not diaphoretic.  Psychiatric: She has a normal mood and affect. Her behavior is normal.       Assessment & Plan:   Physical exam: Screening blood work   ordered Immunizations   Up to date  Colonoscopy    Up to date  Mammogram  Up to date    Gyn  Up to date  Eye exams   Up to date  EKG  Last EKG 2013  --- will repeat today - NSR at 84 bpm, RAE, no change compared to 04/2012, normal EKG Exercise  3-4 times a week Weight working on weight loss Skin  No concerns Substance abuse   none  See Problem List for Assessment and Plan of chronic medical problems.  FU annually

## 2016-12-01 NOTE — Progress Notes (Signed)
Pre visit review using our clinic review tool, if applicable. No additional management support is needed unless otherwise documented below in the visit note. 

## 2016-12-01 NOTE — Assessment & Plan Note (Signed)
Check vitamin d level

## 2016-12-01 NOTE — Addendum Note (Signed)
Addended by: Terence Lux B on: 12/01/2016 10:49 AM   Modules accepted: Orders

## 2016-12-01 NOTE — Assessment & Plan Note (Addendum)
Trazodone as needed - has not needed it recently - due to working out regularly

## 2016-12-06 ENCOUNTER — Encounter: Payer: Self-pay | Admitting: Internal Medicine

## 2016-12-24 ENCOUNTER — Ambulatory Visit (AMBULATORY_SURGERY_CENTER): Payer: BC Managed Care – PPO

## 2016-12-24 VITALS — Ht 65.0 in | Wt 190.6 lb

## 2016-12-24 DIAGNOSIS — Z86010 Personal history of colonic polyps: Secondary | ICD-10-CM

## 2016-12-24 MED ORDER — NA SULFATE-K SULFATE-MG SULF 17.5-3.13-1.6 GM/177ML PO SOLN: 1.0000 | Freq: Once | ORAL | 0 refills | Status: AC

## 2016-12-24 NOTE — Progress Notes (Signed)
Denies allergies to eggs or soy products. Denies complication of anesthesia or sedation. Denies use of weight loss medication. Denies use of O2.   Emmi instructions given for colonoscopy.  

## 2016-12-27 ENCOUNTER — Encounter: Payer: Self-pay | Admitting: Gastroenterology

## 2017-01-07 ENCOUNTER — Encounter: Payer: BC Managed Care – PPO | Admitting: Gastroenterology

## 2017-03-06 ENCOUNTER — Other Ambulatory Visit: Payer: Self-pay | Admitting: Internal Medicine

## 2017-05-14 ENCOUNTER — Encounter (HOSPITAL_BASED_OUTPATIENT_CLINIC_OR_DEPARTMENT_OTHER): Payer: Self-pay | Admitting: Emergency Medicine

## 2017-05-14 ENCOUNTER — Emergency Department (HOSPITAL_BASED_OUTPATIENT_CLINIC_OR_DEPARTMENT_OTHER): Payer: Self-pay

## 2017-05-14 ENCOUNTER — Emergency Department (HOSPITAL_BASED_OUTPATIENT_CLINIC_OR_DEPARTMENT_OTHER)
Admission: EM | Admit: 2017-05-14 | Discharge: 2017-05-14 | Disposition: A | Discharge status: Home / Self Care | Payer: Self-pay | Attending: Physician Assistant | Admitting: Physician Assistant

## 2017-05-14 DIAGNOSIS — Z87891 Personal history of nicotine dependence: Secondary | ICD-10-CM | POA: Insufficient documentation

## 2017-05-14 DIAGNOSIS — S4991XA Unspecified injury of right shoulder and upper arm, initial encounter: Secondary | ICD-10-CM | POA: Insufficient documentation

## 2017-05-14 DIAGNOSIS — I10 Essential (primary) hypertension: Secondary | ICD-10-CM | POA: Insufficient documentation

## 2017-05-14 DIAGNOSIS — Y999 Unspecified external cause status: Secondary | ICD-10-CM | POA: Insufficient documentation

## 2017-05-14 DIAGNOSIS — Y939 Activity, unspecified: Secondary | ICD-10-CM | POA: Insufficient documentation

## 2017-05-14 DIAGNOSIS — Z79899 Other long term (current) drug therapy: Secondary | ICD-10-CM | POA: Insufficient documentation

## 2017-05-14 DIAGNOSIS — Y929 Unspecified place or not applicable: Secondary | ICD-10-CM | POA: Insufficient documentation

## 2017-05-14 DIAGNOSIS — W19XXXA Unspecified fall, initial encounter: Secondary | ICD-10-CM | POA: Insufficient documentation

## 2017-05-14 MED ORDER — NAPROXEN 500 MG PO TABS: 500.0000 mg | ORAL_TABLET | Freq: Two times a day (BID) | ORAL | 0 refills | Status: DC

## 2017-05-14 MED ORDER — METHOCARBAMOL 500 MG PO TABS
500.0000 mg | ORAL_TABLET | Freq: Once | ORAL | Status: AC
  Administered 2017-05-14: 500 mg via ORAL
  Filled 2017-05-14: qty 1

## 2017-05-14 MED ORDER — HYDROCODONE-ACETAMINOPHEN 5-325 MG PO TABS: ORAL_TABLET | ORAL | 0 refills | Status: DC

## 2017-05-14 MED ORDER — ONDANSETRON HCL 4 MG/2ML IJ SOLN
4.0000 mg | Freq: Once | INTRAMUSCULAR | Status: AC
  Administered 2017-05-14: 4 mg via INTRAVENOUS
  Filled 2017-05-14: qty 2

## 2017-05-14 MED ORDER — HYDROMORPHONE HCL 1 MG/ML IJ SOLN
0.5000 mg | Freq: Once | INTRAMUSCULAR | Status: AC
  Administered 2017-05-14: 0.5 mg via INTRAVENOUS
  Filled 2017-05-14: qty 1

## 2017-05-14 MED ORDER — METHOCARBAMOL 500 MG PO TABS: 1000.0000 mg | ORAL_TABLET | Freq: Four times a day (QID) | ORAL | 0 refills | Status: DC

## 2017-05-14 MED ORDER — FENTANYL CITRATE (PF) 100 MCG/2ML IJ SOLN
100.0000 ug | Freq: Once | INTRAMUSCULAR | Status: AC
  Administered 2017-05-14: 100 ug via INTRAVENOUS
  Filled 2017-05-14: qty 2

## 2017-05-14 NOTE — ED Triage Notes (Signed)
Pt c/o RT shoulder pain s/p falling while cleaning bathtub and catching herself with RUE on tub; sts she heard a pop

## 2017-05-14 NOTE — ED Provider Notes (Signed)
[facility] Provider Note   CSN: [id number] Arrival date & time: 05/14/17  1035     History   Chief Complaint Chief Complaint  Patient presents with  . Shoulder Pain    HPI Helen Young is a 56 y.o. female.  Patient presents with acute onset of constant right shoulder pain starting 1 hour ago when she fell while cleaning a bathtub. Patient states that she was leaning forward cleaning the other side of the tub when she slipped and braced herself with her right forearm. She felt a "pop". She had immediate pain. No numbness or tingling. Pain is much worse with movement or palpation. No treatments prior to arrival.       Past Medical History:  Diagnosis Date  . Allergy   . Environmental allergies 02-27-13   OTC meds daily  . Headache(784.0) 02-27-13   occ. migraines  . HTN (hypertension)   . Transfusion history    childhood s/p Tonsillectomy    Patient Active Problem List   Diagnosis Date Noted  . Vitamin D deficiency 12/01/2016  . Tingling sensation 08/11/2016  . Sleep difficulties 08/11/2016  . Numbness in left leg 07/16/2015  . Groin pain 07/16/2015  . Back pain 12/06/2014  . Obesity (BMI 30-39.9) 10/15/2013  . Recurrent ventral incisional hernia 02/09/2013  . Thoracic back pain 10/09/2012  . HTN (hypertension) 04/06/2012  . Scoliosis 04/06/2012  . Environmental allergies 04/06/2012  . Postmenopausal 04/06/2012    Past Surgical History:  Procedure Laterality Date  . ABDOMINAL HYSTERECTOMY    . BLADDER REPAIR    . BREAST REDUCTION SURGERY  03/16/2016  . CYSTOSTOMY N/A 03/02/2013   Procedure: CYSTOSTOMY CLOSURE ;  Surgeon: Alexis Frock, MD;  Location: [facility];  Service: Urology;  Laterality: N/A;  . INCISIONAL HERNIA REPAIR N/A 03/02/2013   Procedure: LAPAROSCOPIC INCISIONAL HERNIA CONVERTED TO OPEN ;  Surgeon: Harl Bowie, MD;  Location: [facility];  Service: General;  Laterality: N/A;  . INGUINAL HERNIA REPAIR     x's 2   . KNEE SURGERY Left     torn meniscus - arthroscopic repair  . NECK SURGERY     Disc replaced with a steel plate  . TONSILLECTOMY     childhoood  . TOTAL ABDOMINAL HYSTERECTOMY W/ BILATERAL SALPINGOOPHORECTOMY     prolapsed uterus at 65-21 years old, ovaries and tubes removed 10 years later due to cysts    OB History    No data available       Home Medications    Prior to Admission medications   Medication Sig Start Date End Date Taking? Authorizing Provider  amLODipine (NORVASC) 5 MG tablet TAKE 1 TABLET(5 MG) BY MOUTH DAILY 12/29/15   Saguier, Percell Miller, PA-C  cetirizine (ZYRTEC) 10 MG tablet Take 10 mg by mouth at bedtime.     [provider]  PREMARIN 0.625 MG tablet TAKE ONE TABLET BY MOUTH DAILY 09/09/13   Midge Minium, MD  tetrahydrozoline 0.05 % ophthalmic solution Place 1 drop into both eyes daily as needed (for dry eyes).    [provider]  traZODone (DESYREL) 50 MG tablet Take 1-2 tablets (50-100 mg total) by mouth at bedtime as needed for sleep. 08/11/16   Binnie Rail, MD  valsartan-hydrochlorothiazide (DIOVAN HCT) 320-12.5 MG tablet Take 1 tablet by mouth daily. 12/01/16   Binnie Rail, MD    Family History Family History  Problem Relation Age of Onset  . Colon cancer Father   . Prostate  cancer Father   . Heart disease Father   . Hypertension Father   . Heart disease Mother   . Stroke Mother   . Hypertension Mother   . Kidney disease Maternal Grandfather   . Diabetes Maternal Grandfather   . Sudden death Paternal Grandfather   . Sudden death Paternal Uncle        Cardiac issues  . Esophageal cancer Neg Hx   . Rectal cancer Neg Hx   . Stomach cancer Neg Hx     Social History Social History  Substance Use Topics  . Smoking status: Former Smoker    Quit date: 09/06/2005  . Smokeless tobacco: Never Used  . Alcohol use 0.0 oz/week     Comment: Rarely     Allergies   Fish allergy and Percocet [oxycodone-acetaminophen]   Review of Systems Review of  Systems  Constitutional: Negative for activity change.  Musculoskeletal: Positive for arthralgias. Negative for back pain, joint swelling and neck pain.  Skin: Negative for wound.  Neurological: Negative for weakness and numbness.     Physical Exam Updated Vital Signs BP (!) 138/105 (BP Location: Left Arm)   Pulse 98   Temp 98.6 F (37 C) (Oral)   Resp 18   SpO2 98%   Physical Exam  Constitutional: She appears well-developed and well-nourished.  HENT:  Head: Normocephalic and atraumatic.  Eyes: Conjunctivae are normal.  Neck: Normal range of motion. Neck supple.  Cardiovascular:  Pulses:      Radial pulses are 2+ on the right side, and 2+ on the left side.  Pulmonary/Chest: No respiratory distress.  Musculoskeletal:       Right shoulder: She exhibits decreased range of motion, tenderness, bony tenderness and spasm.       Right elbow: Normal.      Right wrist: Normal.       Cervical back: Normal. She exhibits normal range of motion, no tenderness and no bony tenderness.  Neurological: She is alert.  Skin: Skin is warm and dry.  Psychiatric: She has a normal mood and affect.  Nursing note and vitals reviewed.    ED Treatments / Results  Labs (all labs ordered are listed, but only abnormal results are displayed) Labs Reviewed - No data to display  EKG  EKG Interpretation None       Radiology Dg Shoulder Right  Result Date: 05/14/2017 CLINICAL DATA:  Right shoulder pain after fall. EXAM: RIGHT SHOULDER - 2+ VIEW COMPARISON:  None. FINDINGS: There is no evidence of fracture or dislocation. There is no evidence of arthropathy or other focal bone abnormality. Soft tissues are unremarkable. IMPRESSION: Normal right shoulder. Electronically Signed   By: Marijo Conception, M.D.   On: 05/14/2017 11:48    Procedures Procedures (including critical care time)  Medications Ordered in ED Medications  fentaNYL (SUBLIMAZE) injection 100 mcg (not administered)     Initial  Impression / Assessment and Plan / ED Course  I have reviewed the triage vital signs and the nursing notes.  Pertinent labs & imaging results that were available during my care of the patient were reviewed by me and considered in my medical decision making (see chart for details).     Patient seen and examined. Work-up initiated. Medications ordered.   Vital signs reviewed and are as follows: BP (!) 138/105 (BP Location: Left Arm)   Pulse 98   Temp 98.6 F (37 C) (Oral)   Resp 18   SpO2 98%   Query proximal humerus  fracture vs dislocation vs sprain.   Patient updated on x-ray results. Exam remains unchanged. Normal radial pulse. She continues to have significant pain. 0.5 mg Dilaudid ordered.  Pain controlled after second dose of IV medications. Will discharge to home on meloxicam, naproxen, Vicodin. Discussed at length with patient and husband on possibility of occult fracture and need for follow-up. Patient given referral to Dr. Ericka Pontiff office.  Patient was counseled on RICE protocol and told to rest injury, use ice for no longer than 15 minutes every hour, compress the area, and elevate above the level of their heart as much as possible to reduce swelling. Questions answered. Patient verbalized understanding.    Patient counseled on use of narcotic pain medications. Counseled not to combine these medications with others containing tylenol. Urged not to drink alcohol, drive, or perform any other activities that requires focus while taking these medications. The patient verbalizes understanding and agrees with the plan.   Final Clinical Impressions(s) / ED Diagnoses   Final diagnoses:  Injury of right shoulder, initial encounter   Patient with right shoulder injury, significant amount of pain and guarding. X-ray does not show displaced fracture or dislocation. Right upper extremity is neurovascularly intact. Given amount of pain, feel that follow-up with either PCP or orthopedist  is warranted for recheck. Discussed that we cannot completely rule out occult fracture at this time. Patient was placed in a sling for immobilization and given pain control for home. Discussed signs and symptoms to return.  New Prescriptions Discharge Medication List as of 05/14/2017  1:45 PM    START taking these medications   Details  HYDROcodone-acetaminophen (NORCO/VICODIN) 5-325 MG tablet Take 1-2 tablets every 6 hours as needed for severe pain, Print    methocarbamol (ROBAXIN) 500 MG tablet Take 2 tablets (1,000 mg total) by mouth 4 (four) times daily., Starting Sat 05/14/2017, Print    naproxen (NAPROSYN) 500 MG tablet Take 1 tablet (500 mg total) by mouth 2 (two) times daily., Starting Sat 05/14/2017, Print         Carlisle Cater, PA-C 05/14/17 1426    Macarthur Critchley, MD 05/14/17 1542

## 2017-05-14 NOTE — Discharge Instructions (Signed)
Please read and follow all provided instructions.  Your diagnoses today include:  1. Injury of right shoulder, initial encounter     Tests performed today include:  An x-ray of the affected area - does NOT show any broken bones  Vital signs. See below for your results today.   Medications prescribed:   Vicodin (hydrocodone/acetaminophen) - narcotic pain medication  DO NOT drive or perform any activities that require you to be awake and alert because this medicine can make you drowsy. BE VERY CAREFUL not to take multiple medicines containing Tylenol (also called acetaminophen). Doing so can lead to an overdose which can damage your liver and cause liver failure and possibly death.   Robaxin (methocarbamol) - muscle relaxer medication  DO NOT drive or perform any activities that require you to be awake and alert because this medicine can make you drowsy.   Take any prescribed medications only as directed.  Home care instructions:   Follow any educational materials contained in this packet  Follow R.I.C.E. Protocol:  R - rest your injury   I  - use ice on injury without applying directly to skin  C - compress injury with bandage or splint  E - elevate the injury as much as possible  Follow-up instructions: Please follow-up with your primary care provider or the provided orthopedic physician (bone specialist) in 5 days for a recheck of your shoulder. Ae we discussed, x-rays are not always 100% clear in ruling out a broken bone and I feel you should have your shoulder rechecked given the amount of pain you are having.   Return instructions:   Please return to the Emergency Department if you experience worsening symptoms.   Please return if you have any other emergent concerns.  Additional Information:  Your vital signs today were: BP 125/85 (BP Location: Right Arm)    Pulse 76    Temp 98.6 F (37 C) (Oral)    Resp 18    Ht 5\' 5"  (1.651 m)    Wt 79.4 kg (175 lb)    SpO2  96%    BMI 29.12 kg/m  If your blood pressure (BP) was elevated above 135/85 this visit, please have this repeated by your doctor within one month.

## 2017-05-18 ENCOUNTER — Ambulatory Visit (INDEPENDENT_AMBULATORY_CARE_PROVIDER_SITE_OTHER)
Admission: RE | Admit: 2017-05-18 | Discharge: 2017-05-18 | Disposition: A | Discharge status: Home / Self Care | Payer: Self-pay | Source: Ambulatory Visit | Attending: Internal Medicine | Admitting: Internal Medicine

## 2017-05-18 ENCOUNTER — Encounter: Payer: Self-pay | Admitting: Internal Medicine

## 2017-05-18 ENCOUNTER — Ambulatory Visit (INDEPENDENT_AMBULATORY_CARE_PROVIDER_SITE_OTHER): Payer: Self-pay | Admitting: Internal Medicine

## 2017-05-18 VITALS — BP 120/82 | HR 90 | Temp 98.6°F | Resp 16 | Wt 199.0 lb

## 2017-05-18 DIAGNOSIS — M25511 Pain in right shoulder: Secondary | ICD-10-CM | POA: Insufficient documentation

## 2017-05-18 MED ORDER — CYCLOBENZAPRINE HCL 5 MG PO TABS: 5.0000 mg | ORAL_TABLET | Freq: Three times a day (TID) | ORAL | 1 refills | Status: DC | PRN

## 2017-05-18 NOTE — Assessment & Plan Note (Signed)
Occurred after fall in bathtub 9/8 - went to ED and xray then was neg for fracture Repeat xray today  ? Ligament tear - needs further evaluation - will refer to sports med Pain controlled with naproxen BID and vicodin prn - does not need more of either Will prescribe flexeril - that did help when she took it and methocarbamol did not help

## 2017-05-18 NOTE — Assessment & Plan Note (Deleted)
Occurred after fall in bathtub 9/8 - went to ED and xray then was neg for fracture Repeat xray today  ? Ligament tear - needs further evaluation - will refer to sports med Pain controlled with naproxen BID and vicodin prn - does not need more of either Will prescribe flexeril - that did help when she took it and methocarbamol did not help

## 2017-05-18 NOTE — Patient Instructions (Addendum)
  Medications reviewed and updated.  Changes include taking flexeril as needed.   Your prescription(s) have been submitted to your pharmacy. Please take as directed and contact our office if you believe you are having problem(s) with the medication(s).   Set up an appointment for sports medicine.

## 2017-05-18 NOTE — Progress Notes (Signed)
Subjective:    Patient ID: Helen Young, female    DOB: 1961-03-12, 56 y.o.   MRN: [id number]  HPI The patient is here for follow up of her shoulder injury.  She went to the emergency room 9/8 for shoulder pain.One hour prior to going to the emergency room she felt like cleaning her bathtub and injured her right shoulder. She felt a pop and she tried to brace herself when she fell. She had immediate pain, but denied any numbness or tingling. Movement or palpation made the pain worse.  She decreased range of motion on exam, tenderness and muscle spasm. Range of motion when he was neurovascularly intact. She received fentanyl, methocarbamol, hydromorphone and Zofran. An x-ray of her shoulder was normal. It was discussed with her that she needed to follow-up because of the possibility of occult fracture. She was given a referral to Dr. Ericka Pontiff office. She was advised to rest, ice, compress it and elevated. She was placed in a sling.  She was discharged home with a prescription for Vicodin 5 /325, methocarbamol and naproxen.  She is taking the pain medication as needed.  She is moving the lower arm without difficulty.  Moving her shoulder above 90 degrees causes pain and moving it forward or backwards causes pain. She thinks it is getting better.  She denies any numbness/tingling.  Sleeping is difficult and uncomfortable.    The methocarbamol did not help.  She did take a left over flexeril and that helped but made her very tired.  She is taking naproxen twice a day and vicodin as needed. She has her arm in the sling.  She does not have a scheduled appointment with orthopedics.   Medications and allergies reviewed with patient and updated if appropriate.  Patient Active Problem List   Diagnosis Date Noted  . Vitamin D deficiency 12/01/2016  . Tingling sensation 08/11/2016  . Sleep difficulties 08/11/2016  . Numbness in left leg 07/16/2015  . Groin pain 07/16/2015  . Back pain 12/06/2014    . Obesity (BMI 30-39.9) 10/15/2013  . Recurrent ventral incisional hernia 02/09/2013  . Thoracic back pain 10/09/2012  . HTN (hypertension) 04/06/2012  . Scoliosis 04/06/2012  . Environmental allergies 04/06/2012  . Postmenopausal 04/06/2012    Current Outpatient Prescriptions on File Prior to Visit  Medication Sig Dispense Refill  . amLODipine (NORVASC) 5 MG tablet TAKE 1 TABLET(5 MG) BY MOUTH DAILY 30 tablet 0  . cetirizine (ZYRTEC) 10 MG tablet Take 10 mg by mouth at bedtime.     Marland Kitchen HYDROcodone-acetaminophen (NORCO/VICODIN) 5-325 MG tablet Take 1-2 tablets every 6 hours as needed for severe pain 12 tablet 0  . naproxen (NAPROSYN) 500 MG tablet Take 1 tablet (500 mg total) by mouth 2 (two) times daily. 30 tablet 0  . PREMARIN 0.625 MG tablet TAKE ONE TABLET BY MOUTH DAILY 30 tablet 5  . tetrahydrozoline 0.05 % ophthalmic solution Place 1 drop into both eyes daily as needed (for dry eyes).    . traZODone (DESYREL) 50 MG tablet Take 1-2 tablets (50-100 mg total) by mouth at bedtime as needed for sleep. 60 tablet 3  . valsartan-hydrochlorothiazide (DIOVAN HCT) 320-12.5 MG tablet Take 1 tablet by mouth daily. 90 tablet 3   No current facility-administered medications on file prior to visit.     Past Medical History:  Diagnosis Date  . Allergy   . Environmental allergies 02-27-13   OTC meds daily  . Headache(784.0) 02-27-13   occ. migraines  .  HTN (hypertension)   . Transfusion history    childhood s/p Tonsillectomy    Past Surgical History:  Procedure Laterality Date  . ABDOMINAL HYSTERECTOMY    . BLADDER REPAIR    . BREAST REDUCTION SURGERY  03/16/2016  . CYSTOSTOMY N/A 03/02/2013   Procedure: CYSTOSTOMY CLOSURE ;  Surgeon: Alexis Frock, MD;  Location: [facility];  Service: Urology;  Laterality: N/A;  . INCISIONAL HERNIA REPAIR N/A 03/02/2013   Procedure: LAPAROSCOPIC INCISIONAL HERNIA CONVERTED TO OPEN ;  Surgeon: Harl Bowie, MD;  Location: [facility];  Service: General;   Laterality: N/A;  . INGUINAL HERNIA REPAIR     x's 2   . KNEE SURGERY Left    torn meniscus - arthroscopic repair  . NECK SURGERY     Disc replaced with a steel plate  . TONSILLECTOMY     childhoood  . TOTAL ABDOMINAL HYSTERECTOMY W/ BILATERAL SALPINGOOPHORECTOMY     prolapsed uterus at 81-73 years old, ovaries and tubes removed 10 years later due to cysts    Social History   Social History  . Marital status: Widowed    Spouse name: N/A  . Number of children: 3  . Years of education: N/A   Occupational History  . [employer] History Main Topics  . Smoking status: Former Smoker    Quit date: 09/06/2005  . Smokeless tobacco: Never Used  . Alcohol use 0.0 oz/week     Comment: Rarely  . Drug use: No  . Sexual activity: Yes   Other Topics Concern  . None   Social History Narrative   No regular exercise right now    Family History  Problem Relation Age of Onset  . Colon cancer Father   . Prostate cancer Father   . Heart disease Father   . Hypertension Father   . Heart disease Mother   . Stroke Mother   . Hypertension Mother   . Kidney disease Maternal Grandfather   . Diabetes Maternal Grandfather   . Sudden death Paternal Grandfather   . Sudden death Paternal Uncle        Cardiac issues  . Esophageal cancer Neg Hx   . Rectal cancer Neg Hx   . Stomach cancer Neg Hx     Review of Systems  Constitutional: Negative for chills and fever.  Musculoskeletal: Positive for arthralgias. Negative for joint swelling and neck pain.  Skin: Negative for color change and wound.  Neurological: Positive for weakness (proximal right UE, normal strength distal RUE). Negative for numbness and headaches.       Objective:   Vitals:   05/18/17 1609  BP: 120/82  Pulse: 90  Resp: 16  Temp: 98.6 F (37 C)  SpO2: 98%   Wt Readings from Last 3 Encounters:  05/18/17 199 lb (90.3 kg)  05/14/17 175 lb (79.4 kg)  12/24/16 190 lb 9.6 oz (86.5 kg)    Body mass index is 33.12 kg/m.   Physical Exam    A Right Shoulder exam was performed.   SWELLING: mild EFFUSION: no  WARMTH: no warmth  TENDERNESS: no tenderness on throughout shoulder joint  ROM: decreased ROM with pain  NEUROLOGICAL EXAM: normal sensation in b/l UE, normal strength in hands, decreased strength in shoulder due to pain PULSES: normal        Assessment & Plan:    See Problem List for Assessment and Plan of chronic medical problems.

## 2017-05-19 ENCOUNTER — Encounter: Payer: Self-pay | Admitting: Family Medicine

## 2017-05-19 ENCOUNTER — Ambulatory Visit (INDEPENDENT_AMBULATORY_CARE_PROVIDER_SITE_OTHER): Payer: Self-pay | Admitting: Family Medicine

## 2017-05-19 ENCOUNTER — Ambulatory Visit: Payer: Self-pay

## 2017-05-19 VITALS — BP 120/90 | HR 107 | Ht 65.0 in | Wt 200.0 lb

## 2017-05-19 DIAGNOSIS — M25511 Pain in right shoulder: Secondary | ICD-10-CM

## 2017-05-19 DIAGNOSIS — M75101 Unspecified rotator cuff tear or rupture of right shoulder, not specified as traumatic: Secondary | ICD-10-CM

## 2017-05-19 NOTE — Progress Notes (Signed)
[facility] [address], [place] [address] Phone: [phone] Subjective:    I'm seeing this patient by the request  of:  Binnie Rail, MD   CC: Right shoulder pain  [id number]  Helen Young is a 56 y.o. female coming in with complaint of right shoulder pain. She explains that she was cleaning her tub. While she was cleaning she lost her balance. Her shoulder popped and she heard something move. Her shoulder went limp after she heard the pop. Initally the pain radiated down her arm to her elbow.  Onset- Saturday Location- Posterior shoulder  Duration- Constant at this time Character- Stiff and painful  Aggravating factors- Moving it, raising it Reliving factors- sling Therapies tried- Muscle relaxers, naperson, vicodine Severity- 3 now; when it did happen 10     Past Medical History:  Diagnosis Date  . Allergy   . Environmental allergies 02-27-13   OTC meds daily  . Headache(784.0) 02-27-13   occ. migraines  . HTN (hypertension)   . Transfusion history    childhood s/p Tonsillectomy   Past Surgical History:  Procedure Laterality Date  . ABDOMINAL HYSTERECTOMY    . BLADDER REPAIR    . BREAST REDUCTION SURGERY  03/16/2016  . CYSTOSTOMY N/A 03/02/2013   Procedure: CYSTOSTOMY CLOSURE ;  Surgeon: Alexis Frock, MD;  Location: [facility];  Service: Urology;  Laterality: N/A;  . INCISIONAL HERNIA REPAIR N/A 03/02/2013   Procedure: LAPAROSCOPIC INCISIONAL HERNIA CONVERTED TO OPEN ;  Surgeon: Harl Bowie, MD;  Location: [facility];  Service: General;  Laterality: N/A;  . INGUINAL HERNIA REPAIR     x's 2   . KNEE SURGERY Left    torn meniscus - arthroscopic repair  . NECK SURGERY     Disc replaced with a steel plate  . TONSILLECTOMY     childhoood  . TOTAL ABDOMINAL HYSTERECTOMY W/ BILATERAL SALPINGOOPHORECTOMY     prolapsed uterus at 72-55 years old, ovaries and tubes removed 10 years later due to cysts   Social History    Social History  . Marital status: Widowed    Spouse name: N/A  . Number of children: 3  . Years of education: N/A   Occupational History  . [employer] History Main Topics  . Smoking status: Former Smoker    Quit date: 09/06/2005  . Smokeless tobacco: Never Used  . Alcohol use 0.0 oz/week     Comment: Rarely  . Drug use: No  . Sexual activity: Yes   Other Topics Concern  . Not on file   Social History Narrative   No regular exercise right now   Allergies  Allergen Reactions  . Fish Allergy Itching and Swelling  . Percocet [Oxycodone-Acetaminophen] Swelling   Family History  Problem Relation Age of Onset  . Colon cancer Father   . Prostate cancer Father   . Heart disease Father   . Hypertension Father   . Heart disease Mother   . Stroke Mother   . Hypertension Mother   . Kidney disease Maternal Grandfather   . Diabetes Maternal Grandfather   . Sudden death Paternal Grandfather   . Sudden death Paternal Uncle        Cardiac issues  . Esophageal cancer Neg Hx   . Rectal cancer Neg Hx   . Stomach cancer Neg Hx      Past medical history, social, surgical and family history all reviewed in  electronic medical record.  No pertanent information unless stated regarding to the chief complaint.   Review of Systems:Review of systems updated and as accurate as of 05/19/17  No headache, visual changes, nausea, vomiting, diarrhea, constipation, dizziness, abdominal pain, skin rash, fevers, chills, night sweats, weight loss, swollen lymph nodes, body aches, joint swelling,  chest pain, shortness of breath, mood changes. Positive muscle aches  Objective  There were no vitals taken for this visit. Systems examined below as of 05/19/17   General: No apparent distress alert and oriented x3 mood and affect normal, dressed appropriately.  HEENT: Pupils equal, extraocular movements intact  Respiratory: Patient's speak in full sentences and does not  appear short of breath  Cardiovascular: No lower extremity edema, non tender, no erythema  Skin: Warm dry intact with no signs of infection or rash on extremities or on axial skeleton.  Abdomen: Soft nontender  Neuro: Cranial nerves II through XII are intact, neurovascularly intact in all extremities with 2+ DTRs and 2+ pulses.  Lymph: No lymphadenopathy of posterior or anterior cervical chain or axillae bilaterally.  Gait normal with good balance and coordination.  MSK:  Non tender with full range of motion and good stability and symmetric strength and tone of  elbows, wrist, hip, knee and ankles bilaterally.  Shoulder: Right Inspection reveals no abnormalities, atrophy or asymmetry. Diffuse tenderness noted ROM is unable to fully check secondary to patient's pain Rotator cuff strength 3+ out of 5 signs of impingement with positive Neer and Hawkin's tests, but positive empty can sign. Speeds and Yergason's tests normal. No labral pathology noted with negative Obrien's, negative clunk and good stability. Normal scapular function observed. Painful arc and no drop arm sign. No apprehension sign Contralateral shoulder unremarkable  MSK US performed of: Right This study was ordered, performed, and interpreted by Charlann Boxer D.O.  Shoulder:   Supraspinatus: Patient does have a large tear full-thickness tear noted. 1.06 cm retraction noted. Infraspinatus:  Appears normal on long and transverse views. Significant increase in Doppler flow Subscapularis: Intersubstance tearing noted Teres Minor:  Appears normal on long and transverse views. AC joint:  Positive capsule distention noted Glenohumeral Joint:  Appears normal without effusion. Glenoid Labrum:  Intact without visualized tears. Biceps Tendon: Hypoechoic changes noted within the tendon sheath  Impression: Large near full-thickness tear of the supraspinatus 1 cm retracted    Impression and Recommendations:     This case required  medical decision making of moderate complexity.      Note: This dictation was prepared with Dragon dictation along with smaller phrase technology. Any transcriptional errors that result from this process are unintentional.

## 2017-05-19 NOTE — Assessment & Plan Note (Signed)
Patient does have a high-grade tear of the supraspinatus. Patient was given different treatment options including advance imaging and surgical intervention which patient declined at this time. Discussed with patient on long term competition this. Patient has elected try some icing regimen, home exercises, which activities doing which ones to avoid. Patient has to increase activity very slowly. Patient is going to do an icing regimen. Follow-up with me again in 3 weeks. Worsening symptoms we'll consider injection, formal physical therapy or severe worsening symptoms we'll consider the advanced imaging.

## 2017-05-19 NOTE — Patient Instructions (Signed)
Good to see you.  Rotator cuff tear  Ice 20 minutes 2 times daily. Usually after activity and before bed. Exercises 3 times a week.  pennsaid pinkie amount topically 2 times daily as needed.  No heavy lifting with the arm Sling ok through the weekend but want you out of it soon.  VItamin D 2000 IU daily  Turmeric 500mg  twice daily  See em again in 3 week and we will make sure you are healing.

## 2017-05-22 ENCOUNTER — Encounter: Payer: Self-pay | Admitting: Family Medicine

## 2017-06-09 ENCOUNTER — Ambulatory Visit: Payer: Self-pay

## 2017-06-09 ENCOUNTER — Ambulatory Visit (INDEPENDENT_AMBULATORY_CARE_PROVIDER_SITE_OTHER): Payer: Self-pay | Admitting: Family Medicine

## 2017-06-09 ENCOUNTER — Encounter: Payer: Self-pay | Admitting: Family Medicine

## 2017-06-09 VITALS — BP 130/100 | HR 83 | Ht 65.0 in | Wt 201.0 lb

## 2017-06-09 DIAGNOSIS — M25511 Pain in right shoulder: Secondary | ICD-10-CM

## 2017-06-09 DIAGNOSIS — M75101 Unspecified rotator cuff tear or rupture of right shoulder, not specified as traumatic: Secondary | ICD-10-CM

## 2017-06-09 NOTE — Patient Instructions (Signed)
Good to see you  Alvera Singh is your friend.  Stay active.  Avoid activity with your hand outside your peripheral vision.  Continue everything else we are doing.  See me again in 4 weeks and if not better we will need to consider MRI.

## 2017-06-09 NOTE — Assessment & Plan Note (Signed)
Patient given injection today because of very minimal improvement. On exam patient had very minimal changes. We discussed with patient about icing regimen, home exercises, which activities doing which ones to avoid. Patient will start to increase activity as tolerated. Patient declined formal physical therapy secondary to financial constraints. Patient will follow-up with me again in 4-6 weeks.

## 2017-06-09 NOTE — Progress Notes (Signed)
[facility] [address], [place] [address] Phone: [phone] Subjective:    I'm seeing this patient by the request  of:    CC: Right arm full  [id number]  Helen Young is a 56 y.o. female coming in for right shoulder pain. She describes a throbbing pain in the joint. She has been having good days and bad days. She feels that she is unable to perform internal rotation with flexion without the arm feeling as if it is going to give out on her.  Patient was initially seen and did have more of a rotator cuff tear. Patient states that with the conservative therapy has not made any significant improvement. Strength may be somewhat better but continues to have significant amount of pain especially at night. Rates the severity of pain is still 80% of what it was previously.      Past Medical History:  Diagnosis Date  . Allergy   . Environmental allergies 02-27-13   OTC meds daily  . Headache(784.0) 02-27-13   occ. migraines  . HTN (hypertension)   . Transfusion history    childhood s/p Tonsillectomy   Past Surgical History:  Procedure Laterality Date  . ABDOMINAL HYSTERECTOMY    . BLADDER REPAIR    . BREAST REDUCTION SURGERY  03/16/2016  . CYSTOSTOMY N/A 03/02/2013   Procedure: CYSTOSTOMY CLOSURE ;  Surgeon: Alexis Frock, MD;  Location: [facility];  Service: Urology;  Laterality: N/A;  . INCISIONAL HERNIA REPAIR N/A 03/02/2013   Procedure: LAPAROSCOPIC INCISIONAL HERNIA CONVERTED TO OPEN ;  Surgeon: Harl Bowie, MD;  Location: [facility];  Service: General;  Laterality: N/A;  . INGUINAL HERNIA REPAIR     x's 2   . KNEE SURGERY Left    torn meniscus - arthroscopic repair  . NECK SURGERY     Disc replaced with a steel plate  . TONSILLECTOMY     childhoood  . TOTAL ABDOMINAL HYSTERECTOMY W/ BILATERAL SALPINGOOPHORECTOMY     prolapsed uterus at 44-31 years old, ovaries and tubes removed 10 years later due to cysts   Social History    Social History  . Marital status: Widowed    Spouse name: N/A  . Number of children: 3  . Years of education: N/A   Occupational History  . [place] History Main Topics  . Smoking status: Former Smoker    Quit date: 09/06/2005  . Smokeless tobacco: Never Used  . Alcohol use 0.0 oz/week     Comment: Rarely  . Drug use: No  . Sexual activity: Yes   Other Topics Concern  . Not on file   Social History Narrative   No regular exercise right now   Allergies  Allergen Reactions  . Fish Allergy Itching and Swelling  . Percocet [Oxycodone-Acetaminophen] Swelling   Family History  Problem Relation Age of Onset  . Colon cancer Father   . Prostate cancer Father   . Heart disease Father   . Hypertension Father   . Heart disease Mother   . Stroke Mother   . Hypertension Mother   . Kidney disease Maternal Grandfather   . Diabetes Maternal Grandfather   . Sudden death Paternal Grandfather   . Sudden death Paternal Uncle        Cardiac issues  . Esophageal cancer Neg Hx   . Rectal cancer Neg Hx   . Stomach cancer Neg Hx  Past medical history, social, surgical and family history all reviewed in electronic medical record.  No pertanent information unless stated regarding to the chief complaint.   Review of Systems:Review of systems updated and as accurate as of 06/09/17  No headache, visual changes, nausea, vomiting, diarrhea, constipation, dizziness, abdominal pain, skin rash, fevers, chills, night sweats, weight loss, swollen lymph nodes, body aches, joint swelling, muscle aches, chest pain, shortness of breath, mood changes.   Objective  There were no vitals taken for this visit. Systems examined below as of 06/09/17   General: No apparent distress alert and oriented x3 mood and affect normal, dressed appropriately.  HEENT: Pupils equal, extraocular movements intact  Respiratory: Patient's speak in full sentences and does not appear  short of breath  Cardiovascular: No lower extremity edema, non tender, no erythema  Skin: Warm dry intact with no signs of infection or rash on extremities or on axial skeleton.  Abdomen: Soft nontender  Neuro: Cranial nerves II through XII are intact, neurovascularly intact in all extremities with 2+ DTRs and 2+ pulses.  Lymph: No lymphadenopathy of posterior or anterior cervical chain or axillae bilaterally.  Gait normal with good balance and coordination.  MSK:  Non tender with full range of motion and good stability and symmetric strength and tone of  elbows, wrist, hip, knee and ankles bilaterally.  Shoulder: Right Inspection reveals no abnormalities, atrophy or asymmetry. Palpation is normal with no tenderness over AC joint or bicipital groove. ROM is full in all planes. Rotator cuff strength 4 out of 5 compared to contralateral side Positive impingement Speeds and Yergason's tests normal. Positive O'Brien's Normal scapular function observed. No painful arc and no drop arm sign. No apprehension sign   MSK US performed of: Right shoulder This study was ordered, performed, and interpreted by Charlann Boxer D.O.  Shoulder:   Supraspinatus:  Large tear still noted but no retraction. Patient hasn't scar tissue formation noted. Teres Minor:  Appears normal on long and transverse views. AC joint:  Distention noted Glenohumeral Joint:  Appears normal without effusion. Glenoid Labrum:  Intact without visualized tears. Biceps Tendon:  Hypoechoic changes near the insertion near the anterior labrum  Procedure: Real-time Ultrasound Guided Injection of right glenohumeral joint Device: GE Logiq Q7  Ultrasound guided injection is preferred based studies that show increased duration, increased effect, greater accuracy, decreased procedural pain, increased response rate with ultrasound guided versus blind injection.  Verbal informed consent obtained.  Time-out conducted.  Noted no overlying  erythema, induration, or other signs of local infection.  Skin prepped in a sterile fashion.  Local anesthesia: Topical Ethyl chloride.  With sterile technique and under real time ultrasound guidance:  Joint visualized.  23g 1  inch needle inserted posterior approach. Pictures taken for needle placement. Patient did have injection of 2 cc of 1% lidocaine, 2 cc of 0.5% Marcaine, and 1.0 cc of Kenalog 40 mg/dL. Completed without difficulty  Pain immediately resolved suggesting accurate placement of the medication.  Advised to call if fevers/chills, erythema, induration, drainage, or persistent bleeding.  Images permanently stored and available for review in the ultrasound unit.  Impression: Technically successful ultrasound guided injection.     Impression and Recommendations:     This case required medical decision making of moderate complexity.      Note: This dictation was prepared with Dragon dictation along with smaller phrase technology. Any transcriptional errors that result from this process are unintentional.

## 2017-07-07 ENCOUNTER — Ambulatory Visit (INDEPENDENT_AMBULATORY_CARE_PROVIDER_SITE_OTHER): Payer: Self-pay | Admitting: Family Medicine

## 2017-07-07 ENCOUNTER — Encounter: Payer: Self-pay | Admitting: Family Medicine

## 2017-07-07 DIAGNOSIS — M75101 Unspecified rotator cuff tear or rupture of right shoulder, not specified as traumatic: Secondary | ICD-10-CM

## 2017-07-07 NOTE — Patient Instructions (Addendum)
Good to see you  You will do great  Ice is your friend pennsaid pinkie amount topically 2 times daily as needed.   Write me with questions.  Otherwise see me again in 6 weeks

## 2017-07-07 NOTE — Assessment & Plan Note (Signed)
Improved after the injection. We iscued ultiple diffeent optins patient has electedto contnue withhome xercises and icg egimen. Ptient will come back and see me agin 4-6 weeks

## 2017-07-07 NOTE — Progress Notes (Signed)
[facility] [address] [place], [place] [address] Phone: [phone] Subjective:       CC: Right shoulder pain  HER:[id number]  Helen Young is a 56 y.o. female coming in with complaint of right shoulder pain. Found to have more of a rotator cuff tear. Had 1 cm of retraction. Was not making any significant improvement and was given an injection 06/09/2017. Patient was to increase activity slowly patient states   80% better. Still some mild discomfort from time to time with certain motions. No severe as what it was previously. Patient states and is doing a lot more daily activities without it is much's pain. Patient states that can have days where she doesn't notice the pain at all.       Past Medical History:  Diagnosis Date  . Allergy   . Environmental allergies 02-27-13   OTC meds daily  . Headache(784.0) 02-27-13   occ. migraines  . HTN (hypertension)   . Transfusion history    childhood s/p Tonsillectomy   Past Surgical History:  Procedure Laterality Date  . ABDOMINAL HYSTERECTOMY    . BLADDER REPAIR    . BREAST REDUCTION SURGERY  03/16/2016  . CYSTOSTOMY N/A 03/02/2013   Procedure: CYSTOSTOMY CLOSURE ;  Surgeon: Alexis Frock, MD;  Location: [facility];  Service: Urology;  Laterality: N/A;  . INCISIONAL HERNIA REPAIR N/A 03/02/2013   Procedure: LAPAROSCOPIC INCISIONAL HERNIA CONVERTED TO OPEN ;  Surgeon: Harl Bowie, MD;  Location: [facility];  Service: General;  Laterality: N/A;  . INGUINAL HERNIA REPAIR     x's 2   . KNEE SURGERY Left    torn meniscus - arthroscopic repair  . NECK SURGERY     Disc replaced with a steel plate  . TONSILLECTOMY     childhoood  . TOTAL ABDOMINAL HYSTERECTOMY W/ BILATERAL SALPINGOOPHORECTOMY     prolapsed uterus at 58-1 years old, ovaries and tubes removed 10 years later due to cysts   Social History   Social History  . Marital status: Widowed    Spouse name: N/A  . Number of children: 3  . Years  of education: N/A   Occupational History  . [place] History Main Topics  . Smoking status: Former Smoker    Quit date: 09/06/2005  . Smokeless tobacco: Never Used  . Alcohol use 0.0 oz/week     Comment: Rarely  . Drug use: No  . Sexual activity: Yes   Other Topics Concern  . Not on file   Social History Narrative   No regular exercise right now   Allergies  Allergen Reactions  . Fish Allergy Itching and Swelling  . Percocet [Oxycodone-Acetaminophen] Swelling   Family History  Problem Relation Age of Onset  . Colon cancer Father   . Prostate cancer Father   . Heart disease Father   . Hypertension Father   . Heart disease Mother   . Stroke Mother   . Hypertension Mother   . Kidney disease Maternal Grandfather   . Diabetes Maternal Grandfather   . Sudden death Paternal Grandfather   . Sudden death Paternal Uncle        Cardiac issues  . Esophageal cancer Neg Hx   . Rectal cancer Neg Hx   . Stomach cancer Neg Hx      Past medical history, social, surgical and family history all reviewed in electronic medical record.  No pertanent information unless stated  regarding to the chief complaint.   Review of Systems:Review of systems updated and as accurate as of 07/07/17  No headache, visual changes, nausea, vomiting, diarrhea, constipation, dizziness, abdominal pain, skin rash, fevers, chills, night sweats, weight loss, swollen lymph nodes, body aches, joint swelling, muscle aches, chest pain, shortness of breath, mood changes.   Objective  There were no vitals taken for this visit. Systems examined below as of 07/07/17   General: No apparent distress alert and oriented x3 mood and affect normal, dressed appropriately.  HEENT: Pupils equal, extraocular movements intact  Respiratory: Patient's speak in full sentences and does not appear short of breath  Cardiovascular: No lower extremity edema, non tender, no erythema  Skin: Warm dry  intact with no signs of infection or rash on extremities or on axial skeleton.  Abdomen: Soft nontender  Neuro: Cranial nerves II through XII are intact, neurovascularly intact in all extremities with 2+ DTRs and 2+ pulses.  Lymph: No lymphadenopathy of posterior or anterior cervical chain or axillae bilaterally.  Gait normal with good balance and coordination.  MSK:  Non tender with full range of motion and good stability and symmetric strength and tone of , elbows, wrist, hip, knee and ankles bilaterally.  Shoulder: Right Inspection reveals no abnormalities, atrophy or asymmetry. Palpation is normal with no tenderness over AC joint or bicipital groove. ROM is full in all planes. Rotator cuff strength normal throughout. Mild positive impingement Speeds and Yergason's tests normal. Very mild positive O'Brien's Normal scapular function observed. No painful arc and no drop arm sign. No apprehension sign Contralateral shoulder unremarkable     Impression and Recommendations:     This case required medical decision making of moderate complexity.      Note: This dictation was prepared with Dragon dictation along with smaller phrase technology. Any transcriptional errors that result from this process are unintentional.

## 2017-07-22 ENCOUNTER — Other Ambulatory Visit: Payer: Self-pay | Admitting: Internal Medicine

## 2017-08-13 ENCOUNTER — Encounter: Payer: Self-pay | Admitting: Family Medicine

## 2017-08-17 ENCOUNTER — Ambulatory Visit: Payer: Self-pay | Admitting: Family Medicine

## 2017-09-23 ENCOUNTER — Other Ambulatory Visit: Payer: Self-pay | Admitting: Internal Medicine

## 2017-10-30 ENCOUNTER — Other Ambulatory Visit: Payer: Self-pay | Admitting: Internal Medicine

## 2017-12-11 ENCOUNTER — Other Ambulatory Visit: Payer: Self-pay | Admitting: Internal Medicine

## 2017-12-12 NOTE — Progress Notes (Signed)
Subjective:    Patient ID: Helen Young, female    DOB: Nov 23, 1960, 57 y.o.   MRN: [id number]  HPI The patient is here for an acute visit.  Symptoms started on sat 4/6.  She had done a vaginal cleansing.  She does this on occasion and there was nothing new this time.  There was no discomfort when she did this or just after.  When she removed the nozle there was blood on it.  Later that day she had pressure and pain in that area and that continued until today.  It is better today than yesterday and two days ago, but still mild.  She has seen some light spotting - she has not seen any today. The pain is more on the left side - it is minimal now.  She has had cysts on this side previously.  Last cyst was 2000.  She had a partial hysterectomy at age 74.  She later had her tubes and ovaries removed due to recurrent cysts.  She denies any fever, chills, dysuria, difficulty urinating, increased urinary frequency, hematuria, pelvic pain or vaginal discharge.  She last saw her gynecologist a year and a half ago.   Medications and allergies reviewed with patient and updated if appropriate.  Patient Active Problem List   Diagnosis Date Noted  . Right rotator cuff tear 05/19/2017  . Acute pain of right shoulder 05/18/2017  . Vitamin D deficiency 12/01/2016  . Tingling sensation 08/11/2016  . Sleep difficulties 08/11/2016  . Numbness in left leg 07/16/2015  . Groin pain 07/16/2015  . Back pain 12/06/2014  . Obesity (BMI 30-39.9) 10/15/2013  . Recurrent ventral incisional hernia 02/09/2013  . Thoracic back pain 10/09/2012  . HTN (hypertension) 04/06/2012  . Scoliosis 04/06/2012  . Environmental allergies 04/06/2012  . Postmenopausal 04/06/2012    Current Outpatient Medications on File Prior to Visit  Medication Sig Dispense Refill  . amLODipine (NORVASC) 5 MG tablet TAKE 1 TABLET(5 MG) BY MOUTH DAILY 30 tablet 0  . cetirizine (ZYRTEC) 10 MG tablet Take 10 mg by mouth at bedtime.     .  cyclobenzaprine (FLEXERIL) 5 MG tablet Take 1 tablet (5 mg total) by mouth 3 (three) times daily as needed for muscle spasms. 30 tablet 1  . HYDROcodone-acetaminophen (NORCO/VICODIN) 5-325 MG tablet Take 1-2 tablets every 6 hours as needed for severe pain 12 tablet 0  . naproxen (NAPROSYN) 500 MG tablet Take 1 tablet (500 mg total) by mouth 2 (two) times daily. 30 tablet 0  . PREMARIN 0.625 MG tablet TAKE ONE TABLET BY MOUTH DAILY 30 tablet 5  . tetrahydrozoline 0.05 % ophthalmic solution Place 1 drop into both eyes daily as needed (for dry eyes).    . traZODone (DESYREL) 50 MG tablet TAKE 1 TO 2 TABLETS(50 TO 100 MG) BY MOUTH AT BEDTIME AS NEEDED FOR SLEEP 60 tablet 0  . valsartan-hydrochlorothiazide (DIOVAN HCT) 320-12.5 MG tablet Take 1 tablet by mouth daily. 90 tablet 3   No current facility-administered medications on file prior to visit.     Past Medical History:  Diagnosis Date  . Allergy   . Environmental allergies 02-27-13   OTC meds daily  . Headache(784.0) 02-27-13   occ. migraines  . HTN (hypertension)   . Transfusion history    childhood s/p Tonsillectomy    Past Surgical History:  Procedure Laterality Date  . ABDOMINAL HYSTERECTOMY    . BLADDER REPAIR    . BREAST REDUCTION SURGERY  03/16/2016  .  CYSTOSTOMY N/A 03/02/2013   Procedure: CYSTOSTOMY CLOSURE ;  Surgeon: Alexis Frock, MD;  Location: [facility];  Service: Urology;  Laterality: N/A;  . INCISIONAL HERNIA REPAIR N/A 03/02/2013   Procedure: LAPAROSCOPIC INCISIONAL HERNIA CONVERTED TO OPEN ;  Surgeon: Harl Bowie, MD;  Location: [facility];  Service: General;  Laterality: N/A;  . INGUINAL HERNIA REPAIR     x's 2   . KNEE SURGERY Left    torn meniscus - arthroscopic repair  . NECK SURGERY     Disc replaced with a steel plate  . TONSILLECTOMY     childhoood  . TOTAL ABDOMINAL HYSTERECTOMY W/ BILATERAL SALPINGOOPHORECTOMY     prolapsed uterus at 66-57 years old, ovaries and tubes removed 10 years later due to  cysts    Social History   Socioeconomic History  . Marital status: Single    Spouse name: Not on file  . Number of children: 3  . Years of education: Not on file  . Highest education level: Not on file  Occupational History  . Occupation: PROGRAM Advertising account executive: [employer]  Social Needs  . Financial resource strain: Not on file  . Food insecurity:    Worry: Not on file    Inability: Not on file  . Transportation needs:    Medical: Not on file    Non-medical: Not on file  Tobacco Use  . Smoking status: Former Smoker    Last attempt to quit: 09/06/2005    Years since quitting: 12.2  . Smokeless tobacco: Never Used  Substance and Sexual Activity  . Alcohol use: Yes    Alcohol/week: 0.0 oz    Comment: Rarely  . Drug use: No  . Sexual activity: Yes  Lifestyle  . Physical activity:    Days per week: Not on file    Minutes per session: Not on file  . Stress: Not on file  Relationships  . Social connections:    Talks on phone: Not on file    Gets together: Not on file    Attends religious service: Not on file    Active member of club or organization: Not on file    Attends meetings of clubs or organizations: Not on file    Relationship status: Not on file  Other Topics Concern  . Not on file  Social History Narrative   No regular exercise right now    Family History  Problem Relation Age of Onset  . Colon cancer Father   . Prostate cancer Father   . Heart disease Father   . Hypertension Father   . Heart disease Mother   . Stroke Mother   . Hypertension Mother   . Kidney disease Maternal Grandfather   . Diabetes Maternal Grandfather   . Sudden death Paternal Grandfather   . Sudden death Paternal Uncle        Cardiac issues  . Esophageal cancer Neg Hx   . Rectal cancer Neg Hx   . Stomach cancer Neg Hx     Review of Systems  Constitutional: Negative for chills and fever.  Genitourinary: Positive for vaginal bleeding. Negative for difficulty  urinating, dysuria, frequency, hematuria, pelvic pain and vaginal discharge.       Objective:   Vitals:   12/13/17 1258  BP: 126/84  Pulse: 82  Resp: 16  Temp: 98.5 F (36.9 C)  SpO2: 98%   BP Readings from Last 3 Encounters:  12/13/17 126/84  07/07/17 110/80  06/09/17 Marland Kitchen)  130/100   Wt Readings from Last 3 Encounters:  12/13/17 198 lb (89.8 kg)  07/07/17 200 lb (90.7 kg)  06/09/17 201 lb (91.2 kg)   Body mass index is 32.95 kg/m.   Physical Exam  Constitutional: She appears well-developed and well-nourished. No distress.  Abdominal: Soft. She exhibits no mass. There is tenderness (left lower pelvic pain with palpation). There is no rebound and no guarding.  Skin: Skin is warm and dry. She is not diaphoretic.           Assessment & Plan:    See Problem List for Assessment and Plan of chronic medical problems.

## 2017-12-13 ENCOUNTER — Encounter: Payer: Self-pay | Admitting: Internal Medicine

## 2017-12-13 ENCOUNTER — Ambulatory Visit (INDEPENDENT_AMBULATORY_CARE_PROVIDER_SITE_OTHER): Payer: Self-pay | Admitting: Internal Medicine

## 2017-12-13 VITALS — BP 126/84 | HR 82 | Temp 98.5°F | Resp 16 | Wt 198.0 lb

## 2017-12-13 DIAGNOSIS — R102 Pelvic and perineal pain unspecified side: Secondary | ICD-10-CM

## 2017-12-13 NOTE — Patient Instructions (Signed)
Call your gynecologist and schedule an appointment to be ideally seen this week.   If you have trouble getting an appointment please call me.

## 2017-12-13 NOTE — Assessment & Plan Note (Signed)
Vaginal pain and spotting after vaginal cleanse with summer's eve, vinegar and water - never happened with prior cleansing Pain improved, no spotting today, but still with pain - tender to palpation left lower pelvic region ? Trauma from cleanse Needs to see gyn as I do not do gyn exams - she will call today to schedule an appointment

## 2017-12-14 ENCOUNTER — Ambulatory Visit: Payer: Self-pay | Admitting: Internal Medicine

## 2018-08-22 NOTE — Progress Notes (Signed)
Subjective:    Patient ID: Helen Young, female    DOB: Jan 23, 1961, 57 y.o.   MRN: [id number]  HPI The patient is here for follow up.  Lower back pain: She has chronic intermittent lower back pain.  In the past it was thought this was caused by her thoracic scoliosis.  Her pain had been relieved in the past by rest.  Exercise also helped.  She has taken Flexeril and an NSAID as needed and that has helped in the past. 3 days ago she had lower back pain, but it felt different than what she had had in the past.  She was laying on the floor and fell asleep for a while.  She had difficulty getting up and then had sudden lower back pain.  The pain was in her central lower back and radiated up her spine.  She took ibuprofen and the pain improved.  She had increased pain moving side to side and certain movements.  It improved over the next 2 days and is better today.  Currently her pain is mild.  Initially she thought it may have been a muscle spasm, but was not sure.  She denied any pain, numbness or tingling radiating into her legs.   She is stressed out and has been off her BP meds since April.  She did not have health insurance,, which is why she was out of her medications.  She does have insurance now and knows she needs to restart them.  Her daughter has a severe case of sarcoidosis and lives in [place].  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  Hypertension: She is compliant with a low sodium diet.  She denies chest pain, palpitations, edema, shortness of breath and regular headaches. She is active during the day, but not exercising regularly.  She does not monitor her blood pressure at home.    Sleep difficulties:  Her sleep is very interrupted.   She has had sleep issues for a long time.  She often has difficulty going to sleep and has difficulty staying asleep.  She was on trazodone in the past  and would ideally like to restart it.  She tolerated medication well.  Medications and allergies reviewed with patient and updated if appropriate.  Patient Active Problem List   Diagnosis Date Noted  . Vaginal pain 12/13/2017  . Right rotator cuff tear 05/19/2017  . Acute pain of right shoulder 05/18/2017  . Vitamin D deficiency 12/01/2016  . Tingling sensation 08/11/2016  . Sleep difficulties 08/11/2016  . Numbness in left leg 07/16/2015  . Groin pain 07/16/2015  . Back pain 12/06/2014  . Obesity (BMI 30-39.9) 10/15/2013  . Recurrent ventral incisional hernia 02/09/2013  . Thoracic back pain 10/09/2012  . HTN (hypertension) 04/06/2012  . Scoliosis 04/06/2012  . Environmental allergies 04/06/2012  . Postmenopausal 04/06/2012    No current outpatient medications on file prior to visit.   No current facility-administered medications on file prior to visit.     Past Medical History:  Diagnosis Date  . Allergy   . Environmental allergies 02-27-13   OTC meds daily  . Headache(784.0) 02-27-13   occ. migraines  . HTN (hypertension)   . Transfusion history    childhood s/p Tonsillectomy    Past Surgical History:  Procedure Laterality Date  . ABDOMINAL HYSTERECTOMY    . BLADDER REPAIR    .  BREAST REDUCTION SURGERY  03/16/2016  . CYSTOSTOMY N/A 03/02/2013   Procedure: CYSTOSTOMY CLOSURE ;  Surgeon: Alexis Frock, MD;  Location: [facility];  Service: Urology;  Laterality: N/A;  . INCISIONAL HERNIA REPAIR N/A 03/02/2013   Procedure: LAPAROSCOPIC INCISIONAL HERNIA CONVERTED TO OPEN ;  Surgeon: Harl Bowie, MD;  Location: [facility];  Service: General;  Laterality: N/A;  . INGUINAL HERNIA REPAIR     x's 2   . KNEE SURGERY Left    torn meniscus - arthroscopic repair  . NECK SURGERY     Disc replaced with a steel plate  . TONSILLECTOMY     childhoood  . TOTAL ABDOMINAL HYSTERECTOMY W/ BILATERAL SALPINGOOPHORECTOMY     prolapsed uterus at 70-48 years old, ovaries and tubes  removed 10 years later due to cysts    Social History   Socioeconomic History  . Marital status: Single    Spouse name: Not on file  . Number of children: 3  . Years of education: Not on file  . Highest education level: Not on file  Occupational History  . Occupation: PROGRAM Advertising account executive: [employer]  Social Needs  . Financial resource strain: Not on file  . Food insecurity:    Worry: Not on file    Inability: Not on file  . Transportation needs:    Medical: Not on file    Non-medical: Not on file  Tobacco Use  . Smoking status: Former Smoker    Last attempt to quit: 09/06/2005    Years since quitting: 12.9  . Smokeless tobacco: Never Used  Substance and Sexual Activity  . Alcohol use: Yes    Alcohol/week: 0.0 standard drinks    Comment: Rarely  . Drug use: No  . Sexual activity: Yes  Lifestyle  . Physical activity:    Days per week: Not on file    Minutes per session: Not on file  . Stress: Not on file  Relationships  . Social connections:    Talks on phone: Not on file    Gets together: Not on file    Attends religious service: Not on file    Active member of club or organization: Not on file    Attends meetings of clubs or organizations: Not on file    Relationship status: Not on file  Other Topics Concern  . Not on file  Social History Narrative   No regular exercise right now    Family History  Problem Relation Age of Onset  . Colon cancer Father   . Prostate cancer Father   . Heart disease Father   . Hypertension Father   . Heart disease Mother   . Stroke Mother   . Hypertension Mother   . Kidney disease Maternal Grandfather   . Diabetes Maternal Grandfather   . Sudden death Paternal Grandfather   . Sudden death Paternal Uncle        Cardiac issues  . Esophageal cancer Neg Hx   . Rectal cancer Neg Hx   . Stomach cancer Neg Hx     Review of Systems  Constitutional: Negative for chills and fever.  Respiratory: Negative for cough,  shortness of breath and wheezing.   Cardiovascular: Negative for chest pain, palpitations and leg swelling.  Neurological: Negative for light-headedness and headaches.       Objective:   Vitals:   08/23/18 1526  BP: (!) 150/102  Pulse: 80  Resp: 16  Temp: 99.3 F (37.4 C)  SpO2: 98%   BP Readings from Last 3 Encounters:  08/23/18 (!) 150/102  12/13/17 126/84  07/07/17 110/80   Wt Readings from Last 3 Encounters:  08/23/18 177 lb (80.3 kg)  12/13/17 198 lb (89.8 kg)  07/07/17 200 lb (90.7 kg)   Body mass index is 29.45 kg/m.   Physical Exam    Constitutional: Appears well-developed and well-nourished. No distress.  HENT:  Head: Normocephalic and atraumatic.  Neck: Neck supple. No tracheal deviation present. No thyromegaly present.  No cervical lymphadenopathy Cardiovascular: Normal rate, regular rhythm and normal heart sounds.   No murmur heard. No carotid bruit .  No edema Pulmonary/Chest: Effort normal and breath sounds normal. No respiratory distress. No has no wheezes. No rales. Musculoskeletal: Mild pain with palpation along central lower lumbar region and paraspinal muscles, no spine deformity Neurological: Normal strength and sensation bilateral lower extremities Skin: Skin is warm and dry. Not diaphoretic.  Psychiatric: Normal mood and affect. Behavior is normal.      Assessment & Plan:    See Problem List for Assessment and Plan of chronic medical problems.

## 2018-08-23 ENCOUNTER — Encounter: Payer: Self-pay | Admitting: Internal Medicine

## 2018-08-23 ENCOUNTER — Ambulatory Visit: Payer: Self-pay | Admitting: Internal Medicine

## 2018-08-23 ENCOUNTER — Other Ambulatory Visit (INDEPENDENT_AMBULATORY_CARE_PROVIDER_SITE_OTHER): Payer: BC Managed Care – PPO

## 2018-08-23 VITALS — BP 150/102 | HR 80 | Temp 99.3°F | Resp 16 | Ht 65.0 in | Wt 177.0 lb

## 2018-08-23 DIAGNOSIS — Z23 Encounter for immunization: Secondary | ICD-10-CM

## 2018-08-23 DIAGNOSIS — I10 Essential (primary) hypertension: Secondary | ICD-10-CM

## 2018-08-23 DIAGNOSIS — G479 Sleep disorder, unspecified: Secondary | ICD-10-CM | POA: Diagnosis not present

## 2018-08-23 DIAGNOSIS — M545 Low back pain, unspecified: Secondary | ICD-10-CM | POA: Insufficient documentation

## 2018-08-23 LAB — COMPREHENSIVE METABOLIC PANEL
ALBUMIN: 4.5 g/dL (ref 3.5–5.2)
ALK PHOS: 52 U/L (ref 39–117)
ALT: 12 U/L (ref 0–35)
AST: 12 U/L (ref 0–37)
BILIRUBIN TOTAL: 0.5 mg/dL (ref 0.2–1.2)
BUN: 15 mg/dL (ref 6–23)
CO2: 28 mEq/L (ref 19–32)
Calcium: 9.6 mg/dL (ref 8.4–10.5)
Chloride: 105 mEq/L (ref 96–112)
Creatinine, Ser: 0.75 mg/dL (ref 0.40–1.20)
GFR: 101.69 mL/min (ref >60.00)
GLUCOSE: 84 mg/dL (ref 70–99)
Potassium: 3.5 mEq/L (ref 3.5–5.1)
Sodium: 141 mEq/L (ref 135–145)
Total Protein: 7.5 g/dL (ref 6.0–8.3)

## 2018-08-23 LAB — CBC WITH DIFFERENTIAL/PLATELET
BASOS PCT: 0.6 % (ref 0.0–3.0)
Basophils Absolute: 0 10*3/uL (ref 0.0–0.1)
EOS PCT: 0.8 % (ref 0.0–5.0)
Eosinophils Absolute: 0.1 10*3/uL (ref 0.0–0.7)
HEMATOCRIT: 38.7 % (ref 36.0–46.0)
Hemoglobin: 12.8 g/dL (ref 12.0–15.0)
Lymphocytes Relative: 36 % (ref 12.0–46.0)
Lymphs Abs: 2.2 10*3/uL (ref 0.7–4.0)
MCHC: 33 g/dL (ref 30.0–36.0)
MCV: 88.9 fl (ref 78.0–100.0)
MONO ABS: 0.6 10*3/uL (ref 0.1–1.0)
MONOS PCT: 9.5 % (ref 3.0–12.0)
Neutro Abs: 3.3 10*3/uL (ref 1.4–7.7)
Neutrophils Relative %: 53.1 % (ref 43.0–77.0)
Platelets: 361 10*3/uL (ref 150.0–400.0)
RBC: 4.35 Mil/uL (ref 3.87–5.11)
RDW: 12.2 % (ref 11.5–15.5)
WBC: 6.1 10*3/uL (ref 4.0–10.5)

## 2018-08-23 LAB — TSH: TSH: 0.75 u[IU]/mL (ref 0.35–4.50)

## 2018-08-23 MED ORDER — VALSARTAN-HYDROCHLOROTHIAZIDE 320-12.5 MG PO TABS: 1.0000 | ORAL_TABLET | Freq: Every day | ORAL | 3 refills | Status: DC

## 2018-08-23 MED ORDER — TRAZODONE HCL 50 MG PO TABS: 50.0000 mg | ORAL_TABLET | Freq: Every evening | ORAL | 1 refills | Status: DC | PRN

## 2018-08-23 MED ORDER — AMLODIPINE BESYLATE 5 MG PO TABS: ORAL_TABLET | ORAL | 1 refills | Status: DC

## 2018-08-23 NOTE — Patient Instructions (Addendum)
  Tests ordered today. Your results will be released to [facility] (or called to you) after review, usually within 72hours after test completion. If any changes need to be made, you will be notified at that same time.   Medications reviewed and updated.  Changes include :  Restart your BP medications and trazodone.   Your prescription(s) have been submitted to your pharmacy. Please take as directed and contact our office if you believe you are having problem(s) with the medication(s).   Please followup in 6 months

## 2018-08-23 NOTE — Assessment & Plan Note (Addendum)
Has been off of her medication for several months-BP elevated Restart amlodipine 5 mg daily Restart valsartan-hydrochlorothiazide 320-12.5 mg daily Monitor blood pressure at home Start regular exercise Low-sodium diet CMP, CBC, TSH

## 2018-08-23 NOTE — Assessment & Plan Note (Signed)
Chronic Has difficulty initiating sleep and maintaining sleep Has been on trazodone in the past and it was effective and well-tolerated Restart trazodone 50 mg daily

## 2018-08-23 NOTE — Assessment & Plan Note (Signed)
She started having lower back pain 3 days ago.  It started after laying on the floor for prolonged period of time and sounds like a probable muscle spasm Improved after taking ibuprofen and is improved since then Mild pain currently Most likely pain will continue to improve if she is careful Ibuprofen as needed Deferred referral to sports medicine at this time Deferred x-ray

## 2018-10-09 ENCOUNTER — Ambulatory Visit: Payer: BC Managed Care – PPO | Admitting: Internal Medicine

## 2018-10-09 NOTE — Progress Notes (Signed)
Subjective:    Patient ID: Helen Young, female    DOB: Jan 09, 1961, 58 y.o.   MRN: [id number]  HPI She is here for an acute visit for cold symptoms.  Her symptoms started 3 weeks ago  She is experiencing nasal congestion, chest congestion, coughing that is productive of clear, green and sometimes brown sputum.  She gets right shoulder blade pain with coughing.  She has some SOB with walking.  She denies fever, wheeze and sinus pain.   She has taken robitussin, mucinex    Medications and allergies reviewed with patient and updated if appropriate.  Patient Active Problem List   Diagnosis Date Noted  . Lower back pain 08/23/2018  . Right rotator cuff tear 05/19/2017  . Acute pain of right shoulder 05/18/2017  . Vitamin D deficiency 12/01/2016  . Sleep difficulties 08/11/2016  . Numbness in left leg 07/16/2015  . Groin pain 07/16/2015  . Back pain 12/06/2014  . Obesity (BMI 30-39.9) 10/15/2013  . Recurrent ventral incisional hernia 02/09/2013  . Thoracic back pain 10/09/2012  . HTN (hypertension) 04/06/2012  . Scoliosis 04/06/2012  . Environmental allergies 04/06/2012    Current Outpatient Medications on File Prior to Visit  Medication Sig Dispense Refill  . amLODipine (NORVASC) 5 MG tablet TAKE 1 TABLET(5 MG) BY MOUTH DAILY 90 tablet 1  . traZODone (DESYREL) 50 MG tablet Take 1 tablet (50 mg total) by mouth at bedtime as needed for sleep. 90 tablet 1  . valsartan-hydrochlorothiazide (DIOVAN HCT) 320-12.5 MG tablet Take 1 tablet by mouth daily. 90 tablet 3   No current facility-administered medications on file prior to visit.     Past Medical History:  Diagnosis Date  . Allergy   . Environmental allergies 02-27-13   OTC meds daily  . Headache(784.0) 02-27-13   occ. migraines  . HTN (hypertension)   . Transfusion history    childhood s/p Tonsillectomy    Past Surgical History:  Procedure Laterality Date  . ABDOMINAL HYSTERECTOMY    . BLADDER REPAIR    .  BREAST REDUCTION SURGERY  03/16/2016  . CYSTOSTOMY N/A 03/02/2013   Procedure: CYSTOSTOMY CLOSURE ;  Surgeon: Alexis Frock, MD;  Location: [facility];  Service: Urology;  Laterality: N/A;  . INCISIONAL HERNIA REPAIR N/A 03/02/2013   Procedure: LAPAROSCOPIC INCISIONAL HERNIA CONVERTED TO OPEN ;  Surgeon: Harl Bowie, MD;  Location: [facility];  Service: General;  Laterality: N/A;  . INGUINAL HERNIA REPAIR     x's 2   . KNEE SURGERY Left    torn meniscus - arthroscopic repair  . NECK SURGERY     Disc replaced with a steel plate  . TONSILLECTOMY     childhoood  . TOTAL ABDOMINAL HYSTERECTOMY W/ BILATERAL SALPINGOOPHORECTOMY     prolapsed uterus at 44-4 years old, ovaries and tubes removed 10 years later due to cysts    Social History   Socioeconomic History  . Marital status: Single    Spouse name: Not on file  . Number of children: 3  . Years of education: Not on file  . Highest education level: Not on file  Occupational History  . Occupation: PROGRAM Advertising account executive: [employer]  Social Needs  . Financial resource strain: Not on file  . Food insecurity:    Worry: Not on file    Inability: Not on file  . Transportation needs:    Medical: Not on file    Non-medical: Not on file  Tobacco Use  . Smoking status: Former Smoker    Last attempt to quit: 09/06/2005    Years since quitting: 13.1  . Smokeless tobacco: Never Used  Substance and Sexual Activity  . Alcohol use: Yes    Alcohol/week: 0.0 standard drinks    Comment: Rarely  . Drug use: No  . Sexual activity: Yes  Lifestyle  . Physical activity:    Days per week: Not on file    Minutes per session: Not on file  . Stress: Not on file  Relationships  . Social connections:    Talks on phone: Not on file    Gets together: Not on file    Attends religious service: Not on file    Active member of club or organization: Not on file    Attends meetings of clubs or organizations: Not on file    Relationship  status: Not on file  Other Topics Concern  . Not on file  Social History Narrative   No regular exercise right now    Family History  Problem Relation Age of Onset  . Colon cancer Father   . Prostate cancer Father   . Heart disease Father   . Hypertension Father   . Heart disease Mother   . Stroke Mother   . Hypertension Mother   . Kidney disease Maternal Grandfather   . Diabetes Maternal Grandfather   . Sudden death Paternal Grandfather   . Sudden death Paternal Uncle        Cardiac issues  . Esophageal cancer Neg Hx   . Rectal cancer Neg Hx   . Stomach cancer Neg Hx     Review of Systems  Constitutional: Positive for chills. Negative for fever.  HENT: Positive for congestion. Negative for ear pain (ear clogged), sinus pressure, sinus pain and sore throat.   Respiratory: Positive for cough and shortness of breath (with walking). Negative for wheezing.   Gastrointestinal: Negative for diarrhea and nausea.  Neurological: Negative for dizziness, light-headedness and headaches.       Objective:   Vitals:   10/10/18 0742  BP: (!) 148/94  Pulse: 80  Resp: 16  Temp: 98.8 F (37.1 C)  SpO2: 99%   Filed Weights   10/10/18 0742  Weight: 182 lb (82.6 kg)   Body mass index is 30.29 kg/m.  Wt Readings from Last 3 Encounters:  10/10/18 182 lb (82.6 kg)  08/23/18 177 lb (80.3 kg)  12/13/17 198 lb (89.8 kg)     Physical Exam GENERAL APPEARANCE: Appears stated age, well appearing, NAD EYES: conjunctiva clear, no icterus HEENT: bilateral tympanic membranes and ear canals normal, oropharynx with no erythema, no thyromegaly, trachea midline, no cervical or supraclavicular lymphadenopathy LUNGS: Clear to auscultation without wheeze or crackles, unlabored breathing, good air entry bilaterally CARDIOVASCULAR: Normal S1,S2 without murmurs, no edema SKIN: warm, dry        Assessment & Plan:   See Problem List for Assessment and Plan of chronic medical problems.

## 2018-10-09 NOTE — Progress Notes (Deleted)
Subjective:    Patient ID: Helen Young, female    DOB: 1960-10-31, 58 y.o.   MRN: [id number]  HPI She is here for an acute visit for cold symptoms.  Her symptoms started   She is experiencing  She has taken  Medications and allergies reviewed with patient and updated if appropriate.  Patient Active Problem List   Diagnosis Date Noted  . Lower back pain 08/23/2018  . Right rotator cuff tear 05/19/2017  . Acute pain of right shoulder 05/18/2017  . Vitamin D deficiency 12/01/2016  . Sleep difficulties 08/11/2016  . Numbness in left leg 07/16/2015  . Groin pain 07/16/2015  . Back pain 12/06/2014  . Obesity (BMI 30-39.9) 10/15/2013  . Recurrent ventral incisional hernia 02/09/2013  . Thoracic back pain 10/09/2012  . HTN (hypertension) 04/06/2012  . Scoliosis 04/06/2012  . Environmental allergies 04/06/2012    Current Outpatient Medications on File Prior to Visit  Medication Sig Dispense Refill  . amLODipine (NORVASC) 5 MG tablet TAKE 1 TABLET(5 MG) BY MOUTH DAILY 90 tablet 1  . traZODone (DESYREL) 50 MG tablet Take 1 tablet (50 mg total) by mouth at bedtime as needed for sleep. 90 tablet 1  . valsartan-hydrochlorothiazide (DIOVAN HCT) 320-12.5 MG tablet Take 1 tablet by mouth daily. 90 tablet 3   No current facility-administered medications on file prior to visit.     Past Medical History:  Diagnosis Date  . Allergy   . Environmental allergies 02-27-13   OTC meds daily  . Headache(784.0) 02-27-13   occ. migraines  . HTN (hypertension)   . Transfusion history    childhood s/p Tonsillectomy    Past Surgical History:  Procedure Laterality Date  . ABDOMINAL HYSTERECTOMY    . BLADDER REPAIR    . BREAST REDUCTION SURGERY  03/16/2016  . CYSTOSTOMY N/A 03/02/2013   Procedure: CYSTOSTOMY CLOSURE ;  Surgeon: Alexis Frock, MD;  Location: [facility];  Service: Urology;  Laterality: N/A;  . INCISIONAL HERNIA REPAIR N/A 03/02/2013   Procedure: LAPAROSCOPIC INCISIONAL HERNIA  CONVERTED TO OPEN ;  Surgeon: Harl Bowie, MD;  Location: [facility];  Service: General;  Laterality: N/A;  . INGUINAL HERNIA REPAIR     x's 2   . KNEE SURGERY Left    torn meniscus - arthroscopic repair  . NECK SURGERY     Disc replaced with a steel plate  . TONSILLECTOMY     childhoood  . TOTAL ABDOMINAL HYSTERECTOMY W/ BILATERAL SALPINGOOPHORECTOMY     prolapsed uterus at 73-80 years old, ovaries and tubes removed 10 years later due to cysts    Social History   Socioeconomic History  . Marital status: Single    Spouse name: Not on file  . Number of children: 3  . Years of education: Not on file  . Highest education level: Not on file  Occupational History  . Occupation: PROGRAM Advertising account executive: [employer]  Social Needs  . Financial resource strain: Not on file  . Food insecurity:    Worry: Not on file    Inability: Not on file  . Transportation needs:    Medical: Not on file    Non-medical: Not on file  Tobacco Use  . Smoking status: Former Smoker    Last attempt to quit: 09/06/2005    Years since quitting: 13.0  . Smokeless tobacco: Never Used  Substance and Sexual Activity  . Alcohol use: Yes    Alcohol/week: 0.0 standard drinks  Comment: Rarely  . Drug use: No  . Sexual activity: Yes  Lifestyle  . Physical activity:    Days per week: Not on file    Minutes per session: Not on file  . Stress: Not on file  Relationships  . Social connections:    Talks on phone: Not on file    Gets together: Not on file    Attends religious service: Not on file    Active member of club or organization: Not on file    Attends meetings of clubs or organizations: Not on file    Relationship status: Not on file  Other Topics Concern  . Not on file  Social History Narrative   No regular exercise right now    Family History  Problem Relation Age of Onset  . Colon cancer Father   . Prostate cancer Father   . Heart disease Father   . Hypertension Father   .  Heart disease Mother   . Stroke Mother   . Hypertension Mother   . Kidney disease Maternal Grandfather   . Diabetes Maternal Grandfather   . Sudden death Paternal Grandfather   . Sudden death Paternal Uncle        Cardiac issues  . Esophageal cancer Neg Hx   . Rectal cancer Neg Hx   . Stomach cancer Neg Hx     Review of Systems     Objective:  There were no vitals filed for this visit. There were no vitals filed for this visit. There is no height or weight on file to calculate BMI.  Wt Readings from Last 3 Encounters:  08/23/18 177 lb (80.3 kg)  12/13/17 198 lb (89.8 kg)  07/07/17 200 lb (90.7 kg)     Physical Exam GENERAL APPEARANCE: Appears stated age, well appearing, NAD EYES: conjunctiva clear, no icterus HEENT: bilateral tympanic membranes and ear canals normal, oropharynx with mild erythema, no thyromegaly, trachea midline, no cervical or supraclavicular lymphadenopathy LUNGS: Clear to auscultation without wheeze or crackles, unlabored breathing, good air entry bilaterally CARDIOVASCULAR: Normal S1,S2 without murmurs, no edema SKIN: warm, dry        Assessment & Plan:   See Problem List for Assessment and Plan of chronic medical problems.

## 2018-10-10 ENCOUNTER — Ambulatory Visit: Payer: BC Managed Care – PPO | Admitting: Internal Medicine

## 2018-10-10 ENCOUNTER — Encounter: Payer: Self-pay | Admitting: Internal Medicine

## 2018-10-10 VITALS — BP 148/94 | HR 80 | Temp 98.8°F | Resp 16 | Ht 65.0 in | Wt 182.0 lb

## 2018-10-10 DIAGNOSIS — J209 Acute bronchitis, unspecified: Secondary | ICD-10-CM

## 2018-10-10 MED ORDER — AMOXICILLIN-POT CLAVULANATE 875-125 MG PO TABS: 1.0000 | ORAL_TABLET | Freq: Two times a day (BID) | ORAL | 0 refills | Status: DC

## 2018-10-10 NOTE — Patient Instructions (Signed)
° ° °  Take the antibiotic as prescribed - complete the entire course.   ° ° °Continue over the counter cold medication, advil and tylenol.  Increase your fluids and rest.  ° ° °Call if no improvement   ° °

## 2018-10-10 NOTE — Assessment & Plan Note (Signed)
Likely bacterial  Start augmentin otc cold medications Rest, fluid Call if no improvement  

## 2018-10-20 ENCOUNTER — Ambulatory Visit: Payer: BC Managed Care – PPO | Admitting: Family
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
## 2018-11-06 ENCOUNTER — Encounter: Payer: Self-pay | Admitting: Internal Medicine

## 2018-11-06 ENCOUNTER — Ambulatory Visit: Payer: BC Managed Care – PPO | Admitting: Internal Medicine

## 2018-11-06 VITALS — BP 138/94 | HR 86 | Temp 98.8°F | Resp 16 | Ht 65.0 in | Wt 176.8 lb

## 2018-11-06 DIAGNOSIS — J338 Other polyp of sinus: Secondary | ICD-10-CM | POA: Diagnosis not present

## 2018-11-06 NOTE — Patient Instructions (Addendum)
Continue zyrtec and flonase daily.     If your allergies are not controlled or if you have more sinus issues we can refer you to ENT.       Nasal Polyps Nasal polyps are growths that form in the nose. Irritation and swelling (inflammation) in the nose or sinus openings can lead to changes in the tissue (mucosa) that lines these areas. Long-term inflammation causes the mucosa to balloon out or grow into a polyp. The polyp fills with watery mucus. Nasal polyps look like moist, gray grapes in the nose.Nasal polyps are not cancer. They do not increase your risk of cancer. You may have one nasal polyp or more than one. They can be small or large. In most cases, they form in both sides of the nose. Polyps can make it hard to breathe through your nose (nasal obstruction). What are the causes? The exact cause of nasal polyps is not known. What increases the risk? You are more likely to develop nasal polyps if you:  Have a family history of the condition.  Have a disease that causes inflammation in your nose or sinuses.  Have another condition that affects your nose or sinuses, such as: ? Nasal allergies (allergic rhinitis). ? Long-term nasal obstruction (nonallergic rhinitis). ? Asthma. ? Nasal or sinus infection, especially fungal infection.  Are female.  Are older than 60 years of age.  Have a sensitivity to aspirin or alcohol.  Smoke.  Have a disease passed down through families that causes increased production of thick mucus (cystic fibrosis). What are the signs or symptoms? Symptoms depend on the size of the polyps. Small polyps may cause few symptoms. When symptoms develop, they may include:  Nasal obstruction.  Decreased senses of smell and taste.  Runny nose.  The feeling of mucus going down the back of the throat (postnasal drip).  Headache, face pain, or sinus pressure.  Snoring.  Frequent nasal or sinus infections.  Itchy eyes. How is this diagnosed? Nasal  polyps may be diagnosed based on your symptoms, your medical history, and a physical exam of the inside of your nose. You may also have tests, such as:  Imaging studies such as a CT scan or MRI to see how large your polyps are and if any are in your sinuses.  Skin or blood tests to find out if your polyps may be caused by allergies.  Washings or swabs taken from your nose to test for inflammation or infection. How is this treated? Small nasal polyps that are not causing symptoms may not need treatment. For large polyps that are causing symptoms, the goal of treatment is to reduce nasal obstruction and improve sinus drainage. Treatment may include:  A medicine to reduce inflammation (steroid). This is usually the first treatment. You may have to take steroids for a short or long period of time. Short-term steroids are usually taken as pills. Long-term steroid treatment is usually in the form of nose drops or spray.  Medicines to treat an underlying condition, such as allergies, asthma, or infection.  Surgery. This may be needed to remove nasal polyps if medicine does not help. Follow these instructions at home:  Take or use over-the-counter and prescription medicines only as told by your health care provider. Do not stop using your medicine even if you start to feel better.  Use solutions to wash or rinse out the inside of your nose (nasal washes or irrigations) as told by your health care provider.  Do not take  medicines that contain aspirin if they make your symptoms worse.  Do not drink alcohol if it makes your symptoms worse.  Do not use any tobacco products, such as cigarettes, chewing tobacco, and e-cigarettes. If you need help quitting, ask your health care provider.  Keep all follow-up visits as told by your health care provider. This is important. Contact a health care provider if:  Your condition does not get better or it gets worse at home after treatment.  You have a  fever.  You have headaches or pain in your face that is new or is getting worse.  You have a bloody nose. This information is not intended to replace advice given to you by your health care provider. Make sure you discuss any questions you have with your health care provider. Document Released: 12/15/2015 Document Revised: 01/29/2016 Document Reviewed: 11/13/2015 Elsevier Interactive Patient Education  2019 [employer].
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
## 2019-02-22 ENCOUNTER — Encounter: Payer: BC Managed Care – PPO | Admitting: Internal Medicine

## 2019-03-05 NOTE — Patient Instructions (Addendum)
[facility]  [place], [place] [address]  Main: [facility] [address] [place], [place] [id number] [phone]   [facility] [employer] [facility] [address] [place] [employer], [place] Across the street from [facility] [phone]     Tests ordered today. Your results will be released to [facility] (or called to you) after review, usually within 72hours after test completion. If any changes need to be made, you will be notified at that same time.  All other Health Maintenance issues reviewed.   All recommended immunizations and age-appropriate screenings are up-to-date or discussed.  No immunizations administered today.   Medications reviewed and updated.  Changes include :   Antibiotic ointment and warm compresses to left eye  Your prescription(s) have been submitted to your pharmacy. Please take as directed and contact our office if you believe you are having problem(s) with the medication(s).   Please followup in 6 months    Health Maintenance, Female Adopting a healthy lifestyle and getting preventive care are important in promoting health and wellness. Ask your health care provider about:  The right schedule for you to have regular tests and exams.  Things you can do on your own to prevent diseases and keep yourself healthy. What should I know about diet, weight, and exercise? Eat a healthy diet   Eat a diet that includes plenty of vegetables, fruits, low-fat dairy products, and lean protein.  Do not eat a lot of foods that are high in solid fats, added sugars, or sodium. Maintain a healthy weight Body mass index (BMI) is used to identify weight problems. It estimates body fat based on height and weight. Your health care provider can help determine your BMI and help you achieve or maintain a healthy weight.  Get regular exercise Get regular exercise. This is one of the most important things you can do for your health. Most adults should:  Exercise for at least 150 minutes each week. The exercise should increase your heart rate and make you sweat (moderate-intensity exercise).  Do strengthening exercises at least twice a week. This is in addition to the moderate-intensity exercise.  Spend less time sitting. Even light physical activity can be beneficial. Watch cholesterol and blood lipids Have your blood tested for lipids and cholesterol at 60 years of age, then have this test every 5 years. Have your cholesterol levels checked more often if:  Your lipid or cholesterol levels are high.  You are older than 60 years of age.  You are at high risk for heart disease. What should I know about cancer screening? Depending on your health history and family history, you may need to have cancer screening at various ages. This may include screening for:  Breast cancer.  Cervical cancer.  Colorectal cancer.  Skin cancer.  Lung cancer. What should I know about heart disease, diabetes, and high blood pressure? Blood pressure and heart disease  High blood pressure causes heart disease and increases the risk of stroke. This is more likely to develop in people who have high blood pressure readings, are of African descent, or are overweight.  Have your blood pressure checked: ? Every 3-5 years if you are 35-70 years of age. ? Every year if you are 4 years old or older. Diabetes Have regular diabetes screenings. This checks your fasting blood sugar level. Have the screening done:  Once every  three years after age 18 if you are at a normal weight and have a low risk for diabetes.  More often and at a younger age if you are overweight or have a high risk for diabetes. What should I know about preventing infection? Hepatitis B If you have a higher risk for hepatitis B, you should be screened for  this virus. Talk with your health care provider to find out if you are at risk for hepatitis B infection. Hepatitis C Testing is recommended for:  Everyone born from [birth year] through 1967.  Anyone with known risk factors for hepatitis C. Sexually transmitted infections (STIs)  Get screened for STIs, including gonorrhea and chlamydia, if: ? You are sexually active and are younger than 60 years of age. ? You are older than 60 years of age and your health care provider tells you that you are at risk for this type of infection. ? Your sexual activity has changed since you were last screened, and you are at increased risk for chlamydia or gonorrhea. Ask your health care provider if you are at risk.  Ask your health care provider about whether you are at high risk for HIV. Your health care provider may recommend a prescription medicine to help prevent HIV infection. If you choose to take medicine to prevent HIV, you should first get tested for HIV. You should then be tested every 3 months for as long as you are taking the medicine. Pregnancy  If you are about to stop having your period (premenopausal) and you may become pregnant, seek counseling before you get pregnant.  Take 400 to 800 micrograms (mcg) of folic acid every day if you become pregnant.  Ask for birth control (contraception) if you want to prevent pregnancy. Osteoporosis and menopause Osteoporosis is a disease in which the bones lose minerals and strength with aging. This can result in bone fractures. If you are 96 years old or older, or if you are at risk for osteoporosis and fractures, ask your health care provider if you should:  Be screened for bone loss.  Take a calcium or vitamin D supplement to lower your risk of fractures.  Be given hormone replacement therapy (HRT) to treat symptoms of menopause. Follow these instructions at home: Lifestyle  Do not use any products that contain nicotine or tobacco, such as cigarettes,  e-cigarettes, and chewing tobacco. If you need help quitting, ask your health care provider.  Do not use street drugs.  Do not share needles.  Ask your health care provider for help if you need support or information about quitting drugs. Alcohol use  Do not drink alcohol if: ? Your health care provider tells you not to drink. ? You are pregnant, may be pregnant, or are planning to become pregnant.  If you drink alcohol: ? Limit how much you use to 0-1 drink a day. ? Limit intake if you are breastfeeding.  Be aware of how much alcohol is in your drink. In the [place] one drink equals one 12 oz bottle of beer (355 mL), one 5 oz glass of wine (148 mL), or one 1 oz glass of hard liquor (44 mL). General instructions  Schedule regular health, dental, and eye exams.  Stay current with your vaccines.  Tell your health care provider if: ? You often feel depressed. ? You have ever been abused or do not feel safe at home. Summary  Adopting a healthy lifestyle and getting preventive care are important in promoting health and wellness.  Follow your health care provider's instructions about healthy diet, exercising, and getting tested or screened for diseases.  Follow your health care provider's instructions on monitoring your cholesterol and blood pressure. This information is not intended to replace advice given to you by your health care provider. Make sure you discuss any questions you have with your health care provider. Document Released: 03/08/2011 Document Revised: 08/16/2018 Document Reviewed: 08/16/2018 Elsevier Patient Education  2020 [employer].

## 2019-03-05 NOTE — Progress Notes (Signed)
Subjective:    Patient ID: Helen Young, female    DOB: Sep 20, 1960, 58 y.o.   MRN: [id number]  HPI She is here for a physical exam.   Left eye issues:  Sunday morning she woke up and her left eye was itching and swollen.  If felt like something was in it.  It was swollen under her eye yesterday more than today.  She denies tearing or discharge.  She denies any redness or pinkness.  She has dry eyes and uses otc systane eye drops.  She states mild blurry vision.  It was painful Sunday and yesterday.      Medications and allergies reviewed with patient and updated if appropriate.  Patient Active Problem List   Diagnosis Date Noted  . 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 Lower back pain 08/23/2018  . Right rotator cuff tear 05/19/2017  . Acute pain of right shoulder 05/18/2017  . Vitamin D deficiency 12/01/2016  . Sleep difficulties 08/11/2016  . Numbness in left leg 07/16/2015  . Groin pain 07/16/2015  . Back pain 12/06/2014  . Obesity (BMI 30-39.9) 10/15/2013  . Recurrent ventral incisional hernia 02/09/2013  . Thoracic back pain 10/09/2012  . HTN (hypertension) 04/06/2012  . Scoliosis 04/06/2012  . Environmental allergies 04/06/2012    Current Outpatient Medications on File Prior to Visit  Medication Sig Dispense Refill  . amLODipine (NORVASC) 5 MG tablet TAKE 1 TABLET(5 MG) BY MOUTH DAILY 90 tablet 1  . traZODone (DESYREL) 50 MG tablet Take 1 tablet (50 mg total) by mouth at bedtime as needed for sleep. 90 tablet 1  . valsartan-hydrochlorothiazide (DIOVAN HCT) 320-12.5 MG tablet Take 1 tablet by mouth daily. 90 tablet 3   No current facility-administered medications on file prior to visit.     Past Medical History:  Diagnosis Date  . Allergy   . Environmental allergies 02-27-13   OTC meds daily  . Headache(784.0) 02-27-13   occ. migraines  . HTN (hypertension)   . Transfusion history    childhood s/p Tonsillectomy    Past Surgical History:  Procedure  Laterality Date  . ABDOMINAL HYSTERECTOMY    . BLADDER REPAIR    . BREAST REDUCTION SURGERY  03/16/2016  . CYSTOSTOMY N/A 03/02/2013   Procedure: CYSTOSTOMY CLOSURE ;  Surgeon: Alexis Frock, MD;  Location: [facility];  Service: Urology;  Laterality: N/A;  . INCISIONAL HERNIA REPAIR N/A 03/02/2013   Procedure: LAPAROSCOPIC INCISIONAL HERNIA CONVERTED TO OPEN ;  Surgeon: Harl Bowie, MD;  Location: [facility];  Service: General;  Laterality: N/A;  . INGUINAL HERNIA REPAIR     x's 2   . KNEE SURGERY Left    torn meniscus - arthroscopic repair  . NECK SURGERY     Disc replaced with a steel plate  . TONSILLECTOMY     childhoood  . TOTAL ABDOMINAL HYSTERECTOMY W/ BILATERAL SALPINGOOPHORECTOMY     prolapsed uterus at 24-29 years old, ovaries and tubes removed 10 years later due to cysts    Social History   Socioeconomic History  . Marital status: Single    Spouse name: Not on file  . Number of children: 3  . Years of education: Not on file  . Highest education level: Not on file  Occupational History  . Occupation: PROGRAM Advertising account executive: [employer]  Social Needs  . Financial resource strain: Not on file  . Food insecurity    Worry: Not on file  Inability: Not on file  . Transportation needs    Medical: Not on file    Non-medical: Not on file  Tobacco Use  . Smoking status: Former Smoker    Quit date: 09/06/2005    Years since quitting: 13.5  . Smokeless tobacco: Never Used  Substance and Sexual Activity  . Alcohol use: Yes    Alcohol/week: 0.0 standard drinks    Comment: Rarely  . Drug use: No  . Sexual activity: Yes  Lifestyle  . Physical activity    Days per week: Not on file    Minutes per session: Not on file  . Stress: Not on file  Relationships  . Social Herbalist on phone: Not on file    Gets together: Not on file    Attends religious service: Not on file    Active member of club or organization: Not on file    Attends meetings of  clubs or organizations: Not on file    Relationship status: Not on file  Other Topics Concern  . Not on file  Social History Narrative   No regular exercise right now    Family History  Problem Relation Age of Onset  . Colon cancer Father   . Prostate cancer Father   . Heart disease Father   . Hypertension Father   . Heart disease Mother   . Stroke Mother   . Hypertension Mother   . Kidney disease Maternal Grandfather   . Diabetes Maternal Grandfather   . Sudden death Paternal Grandfather   . Sudden death Paternal Uncle        Cardiac issues  . Esophageal cancer Neg Hx   . Rectal cancer Neg Hx   . Stomach cancer Neg Hx     Review of Systems  Constitutional: Negative for chills and fever.  Eyes: Positive for itching and visual disturbance.  Respiratory: Negative for cough, shortness of breath and wheezing.   Cardiovascular: Negative for chest pain, palpitations and leg swelling.  Gastrointestinal: Negative for abdominal pain, blood in stool, constipation, diarrhea and nausea.       No gerd  Genitourinary: Negative for dysuria and hematuria.  Musculoskeletal: Positive for back pain (scoliosis related - on rare occasion). Negative for arthralgias.  Skin: Negative for color change and rash.  Neurological: Negative for light-headedness and headaches.  Psychiatric/Behavioral: Negative for dysphoric mood. The patient is not nervous/anxious.        Objective:   Vitals:   03/06/19 1456  BP: 130/88  Pulse: 99  Resp: 16  Temp: 98.3 F (36.8 C)  SpO2: 97%   Filed Weights   03/06/19 1456  Weight: 189 lb (85.7 kg)   Body mass index is 31.45 kg/m.  BP Readings from Last 3 Encounters:  03/06/19 130/88  11/06/18 (!) 138/94  10/10/18 (!) 148/94    Wt Readings from Last 3 Encounters:  03/06/19 189 lb (85.7 kg)  11/06/18 176 lb 12.8 oz (80.2 kg)  10/10/18 182 lb (82.6 kg)     Physical Exam Constitutional: She appears well-developed and well-nourished. No distress.   HENT:  Head: Normocephalic and atraumatic.  Right Ear: External ear normal. Normal ear canal and TM Left Ear: External ear normal.  Normal ear canal and TM Mouth/Throat: Oropharynx is clear and moist.  Eyes: Conjunctivae and EOM are normal. Left medial lower eye lid swollen with mild erythema Neck: Neck supple. No tracheal deviation present. No thyromegaly present.  No carotid bruit  Cardiovascular: Normal rate,  regular rhythm and normal heart sounds.  No murmur heard.  No edema. Pulmonary/Chest: Effort normal and breath sounds normal. No respiratory distress. She has no wheezes. She has no rales.  Breast: deferred   Abdominal: Soft. She exhibits no distension. There is no tenderness.  Lymphadenopathy: She has no cervical adenopathy.  Skin: Skin is warm and dry. She is not diaphoretic.  Psychiatric: She has a normal mood and affect. Her behavior is normal.        Assessment & Plan:   Physical exam: Screening blood work ordered Immunizations   discussed Shingrix, others up-to-date Colonoscopy    Up to date  Mammogram   Not up to date  Gyn   - over due - will schedule  Eye exams   Up to date  Exercise  No regular exercise Weight  Advised weight los Skin    no concerns Substance abuse   none  See Problem List for Assessment and Plan of chronic medical problems.  FU in 6 months

## 2019-03-06 ENCOUNTER — Other Ambulatory Visit: Payer: Self-pay

## 2019-03-06 ENCOUNTER — Other Ambulatory Visit (INDEPENDENT_AMBULATORY_CARE_PROVIDER_SITE_OTHER): Payer: BC Managed Care – PPO

## 2019-03-06 ENCOUNTER — Encounter: Payer: Self-pay | Admitting: Internal Medicine

## 2019-03-06 ENCOUNTER — Ambulatory Visit (INDEPENDENT_AMBULATORY_CARE_PROVIDER_SITE_OTHER): Payer: BC Managed Care – PPO | Admitting: Internal Medicine

## 2019-03-06 VITALS — BP 130/88 | HR 99 | Temp 98.3°F | Resp 16 | Ht 65.0 in | Wt 189.0 lb

## 2019-03-06 DIAGNOSIS — I10 Essential (primary) hypertension: Secondary | ICD-10-CM

## 2019-03-06 DIAGNOSIS — H00019 Hordeolum externum unspecified eye, unspecified eyelid: Secondary | ICD-10-CM | POA: Insufficient documentation

## 2019-03-06 DIAGNOSIS — Z0001 Encounter for general adult medical examination with abnormal findings: Secondary | ICD-10-CM | POA: Diagnosis not present

## 2019-03-06 DIAGNOSIS — E669 Obesity, unspecified: Secondary | ICD-10-CM

## 2019-03-06 DIAGNOSIS — Z0000 Encounter for general adult medical examination without abnormal findings: Secondary | ICD-10-CM

## 2019-03-06 DIAGNOSIS — H00015 Hordeolum externum left lower eyelid: Secondary | ICD-10-CM

## 2019-03-06 DIAGNOSIS — G479 Sleep disorder, unspecified: Secondary | ICD-10-CM

## 2019-03-06 LAB — COMPREHENSIVE METABOLIC PANEL
ALT: 13 U/L (ref 0–35)
AST: 14 U/L (ref 0–37)
Albumin: 4.5 g/dL (ref 3.5–5.2)
Alkaline Phosphatase: 63 U/L (ref 39–117)
BUN: 10 mg/dL (ref 6–23)
CO2: 28 mEq/L (ref 19–32)
Calcium: 9.5 mg/dL (ref 8.4–10.5)
Chloride: 103 mEq/L (ref 96–112)
Creatinine, Ser: 0.84 mg/dL (ref 0.40–1.20)
GFR: 83.8 mL/min (ref >60.00)
Glucose, Bld: 84 mg/dL (ref 70–99)
Potassium: 3.7 mEq/L (ref 3.5–5.1)
Sodium: 140 mEq/L (ref 135–145)
Total Bilirubin: 0.3 mg/dL (ref 0.2–1.2)
Total Protein: 7.6 g/dL (ref 6.0–8.3)

## 2019-03-06 LAB — CBC WITH DIFFERENTIAL/PLATELET
Basophils Absolute: 0.1 10*3/uL (ref 0.0–0.1)
Basophils Relative: 0.9 % (ref 0.0–3.0)
Eosinophils Absolute: 0.2 10*3/uL (ref 0.0–0.7)
Eosinophils Relative: 3.3 % (ref 0.0–5.0)
HCT: 39.5 % (ref 36.0–46.0)
Hemoglobin: 13 g/dL (ref 12.0–15.0)
Lymphocytes Relative: 50.6 % — ABNORMAL HIGH (ref 12.0–46.0)
Lymphs Abs: 2.8 10*3/uL (ref 0.7–4.0)
MCHC: 32.9 g/dL (ref 30.0–36.0)
MCV: 89.5 fl (ref 78.0–100.0)
Monocytes Absolute: 0.4 10*3/uL (ref 0.1–1.0)
Monocytes Relative: 7 % (ref 3.0–12.0)
Neutro Abs: 2.1 10*3/uL (ref 1.4–7.7)
Neutrophils Relative %: 38.2 % — ABNORMAL LOW (ref 43.0–77.0)
Platelets: 352 10*3/uL (ref 150.0–400.0)
RBC: 4.42 Mil/uL (ref 3.87–5.11)
RDW: 12 % (ref 11.5–15.5)
WBC: 5.5 10*3/uL (ref 4.0–10.5)

## 2019-03-06 LAB — LIPID PANEL
Cholesterol: 208 mg/dL — ABNORMAL HIGH (ref 0–200)
HDL: 55.5 mg/dL (ref >39.00)
NonHDL: 152.01
Total CHOL/HDL Ratio: 4
Triglycerides: 250 mg/dL — ABNORMAL HIGH (ref 0.0–149.0)
VLDL: 50 mg/dL — ABNORMAL HIGH (ref 0.0–40.0)

## 2019-03-06 LAB — TSH: TSH: 0.95 u[IU]/mL (ref 0.35–4.50)

## 2019-03-06 MED ORDER — ERYTHROMYCIN 5 MG/GM OP OINT: 1.0000 "application " | TOPICAL_OINTMENT | Freq: Every day | OPHTHALMIC | 0 refills | Status: DC

## 2019-03-06 NOTE — Assessment & Plan Note (Signed)
Taking trazodone prn not nightly continue

## 2019-03-06 NOTE — Assessment & Plan Note (Signed)
BP at home 130/80, 126/75, occasionally 140/90 Overall controlled Current regimen effective and well tolerated Continue current medications at current doses Cmp, tsh

## 2019-03-06 NOTE — Assessment & Plan Note (Signed)
With htn Advised weight loss Encouraged regular exercise

## 2019-03-06 NOTE — Assessment & Plan Note (Signed)
Eye lid has probable stye Warm compresses  Erythromycin ointment See eye doctor if no improvement

## 2019-03-07 LAB — LDL CHOLESTEROL, DIRECT: Direct LDL: 118 mg/dL

## 2019-03-20 NOTE — Progress Notes (Signed)
Subjective:    Patient ID: Helen Young, female    DOB: Jul 11, 1961, 58 y.o.   MRN: [id number]  HPI The patient is here for an acute visit.    LLQ pain and left groin pain:  It started last week. It got worse 4 days ago and has persisted.  The pain can be severe at times and she almost went to the emergency room.   It is more painful with walking and standing and seems to be slightly better with sitting down or lying down, but does not go away completely.   She denies any back pain.  She has not had any new activities or injuries prior to this pain starting.  She has had some nausea, but denies GERD, diarrhea, constipation, blood in the stool or black stool.  She denies fevers or chills.  She denies dysuria, hematuria and increased urinary frequency.   Last colonoscopy 2012 - normal.  She has had a total hysterectomy years ago.  She did not take anything for the pain because she was not sure what to take.   Medications and allergies reviewed with patient and updated if appropriate.  Patient Active Problem List   Diagnosis Date Noted  . Stye 03/06/2019  . 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 Lower back pain 08/23/2018  . Right rotator cuff tear 05/19/2017  . Acute pain of right shoulder 05/18/2017  . Vitamin D deficiency 12/01/2016  . Sleep difficulties 08/11/2016  . Numbness in left leg 07/16/2015  . Back pain 12/06/2014  . Obesity (BMI 30-39.9) 10/15/2013  . Recurrent ventral incisional hernia 02/09/2013  . Thoracic back pain 10/09/2012  . HTN (hypertension) 04/06/2012  . Scoliosis 04/06/2012  . Environmental allergies 04/06/2012    Current Outpatient Medications on File Prior to Visit  Medication Sig Dispense Refill  . amLODipine (NORVASC) 5 MG tablet TAKE 1 TABLET(5 MG) BY MOUTH DAILY 90 tablet 1  . traZODone (DESYREL) 50 MG tablet Take 1 tablet (50 mg total) by mouth at bedtime as needed for sleep. 90 tablet 1  . valsartan-hydrochlorothiazide (DIOVAN HCT) 320-12.5 MG  tablet Take 1 tablet by mouth daily. 90 tablet 3  . erythromycin ophthalmic ointment Place 1 application into the left eye at bedtime. 3.5 g 0   No current facility-administered medications on file prior to visit.     Past Medical History:  Diagnosis Date  . Allergy   . Environmental allergies 02-27-13   OTC meds daily  . Headache(784.0) 02-27-13   occ. migraines  . HTN (hypertension)   . Transfusion history    childhood s/p Tonsillectomy    Past Surgical History:  Procedure Laterality Date  . ABDOMINAL HYSTERECTOMY    . BLADDER REPAIR    . BREAST REDUCTION SURGERY  03/16/2016  . CYSTOSTOMY N/A 03/02/2013   Procedure: CYSTOSTOMY CLOSURE ;  Surgeon: Alexis Frock, MD;  Location: [facility];  Service: Urology;  Laterality: N/A;  . INCISIONAL HERNIA REPAIR N/A 03/02/2013   Procedure: LAPAROSCOPIC INCISIONAL HERNIA CONVERTED TO OPEN ;  Surgeon: Harl Bowie, MD;  Location: [facility];  Service: General;  Laterality: N/A;  . INGUINAL HERNIA REPAIR     x's 2   . KNEE SURGERY Left    torn meniscus - arthroscopic repair  . NECK SURGERY     Disc replaced with a steel plate  . TONSILLECTOMY     childhoood  . TOTAL ABDOMINAL HYSTERECTOMY W/ BILATERAL SALPINGOOPHORECTOMY     prolapsed uterus at 20-64 years old,  ovaries and tubes removed 10 years later due to cysts    Social History   Socioeconomic History  . Marital status: Single    Spouse name: Not on file  . Number of children: 3  . Years of education: Not on file  . Highest education level: Not on file  Occupational History  . 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 Financial resource strain: Not on file  . Food insecurity    Worry: Not on file    Inability: Not on file  . Transportation needs    Medical: Not on file    Non-medical: Not on file  Tobacco Use  . Smoking status: Former Smoker    Quit date: 09/06/2005    Years since quitting: 13.5  . Smokeless tobacco: Never Used   Substance and Sexual Activity  . Alcohol use: Yes    Alcohol/week: 0.0 standard drinks    Comment: Rarely  . Drug use: No  . Sexual activity: Yes  Lifestyle  . Physical activity    Days per week: Not on file    Minutes per session: Not on file  . Stress: Not on file  Relationships  . Social Herbalist on phone: Not on file    Gets together: Not on file    Attends religious service: Not on file    Active member of club or organization: Not on file    Attends meetings of clubs or organizations: Not on file    Relationship status: Not on file  Other Topics Concern  . Not on file  Social History Narrative   No regular exercise right now    Family History  Problem Relation Age of Onset  . Colon cancer Father   . Prostate cancer Father   . Heart disease Father   . Hypertension Father   . Heart disease Mother   . Stroke Mother   . Hypertension Mother   . Kidney disease Maternal Grandfather   . Diabetes Maternal Grandfather   . Sudden death Paternal Grandfather   . Sudden death Paternal Uncle        Cardiac issues  . Esophageal cancer Neg Hx   . Rectal cancer Neg Hx   . Stomach cancer Neg Hx     Review of Systems  Constitutional: Negative for chills and fever.  Gastrointestinal: Positive for abdominal pain (LLQ ) and nausea. Negative for blood in stool (no black stool), constipation and diarrhea.  Genitourinary: Negative for dysuria, frequency and hematuria.  Musculoskeletal: Negative for back pain.  Skin: Negative for rash.  Neurological: Negative for light-headedness and headaches.       Objective:   Vitals:   03/21/19 0853  BP: (!) 128/96  Pulse: 89  Temp: 98.7 F (37.1 C)  SpO2: 96%   BP Readings from Last 3 Encounters:  03/21/19 (!) 128/96  03/06/19 130/88  11/06/18 (!) 138/94   Wt Readings from Last 3 Encounters:  03/21/19 194 lb (88 kg)  03/06/19 189 lb (85.7 kg)  11/06/18 176 lb 12.8 oz (80.2 kg)   Body mass index is 32.28 kg/m.    Physical Exam    Constitutional: Appears well-developed and well-nourished. No distress.  Abdomen: Soft, nondistended, tenderness in left lower quadrant and suprapubic region without rebound or guarding, no tenderness elsewhere, no hepatosplenomegaly, no hernia, no palpable masses Musculoskeletal: No lower back discomfort with palpation Skin: Skin is warm and dry. Not diaphoretic.  Assessment & Plan:    See Problem List for Assessment and Plan of chronic medical problems.

## 2019-03-21 ENCOUNTER — Encounter: Payer: Self-pay | Admitting: Internal Medicine

## 2019-03-21 ENCOUNTER — Other Ambulatory Visit (INDEPENDENT_AMBULATORY_CARE_PROVIDER_SITE_OTHER): Payer: BC Managed Care – PPO

## 2019-03-21 ENCOUNTER — Other Ambulatory Visit: Payer: Self-pay

## 2019-03-21 ENCOUNTER — Telehealth: Payer: Self-pay | Admitting: Internal Medicine

## 2019-03-21 ENCOUNTER — Ambulatory Visit (INDEPENDENT_AMBULATORY_CARE_PROVIDER_SITE_OTHER): Payer: BC Managed Care – PPO | Admitting: Internal Medicine

## 2019-03-21 ENCOUNTER — Other Ambulatory Visit: Payer: Self-pay | Admitting: Internal Medicine

## 2019-03-21 ENCOUNTER — Ambulatory Visit
Admission: RE | Admit: 2019-03-21 | Discharge: 2019-03-21 | Disposition: A | Discharge status: Home / Self Care | Payer: BC Managed Care – PPO | Source: Ambulatory Visit | Attending: Internal Medicine | Admitting: Internal Medicine

## 2019-03-21 VITALS — BP 128/96 | HR 89 | Temp 98.7°F | Wt 194.0 lb

## 2019-03-21 DIAGNOSIS — R1032 Left lower quadrant pain: Secondary | ICD-10-CM | POA: Diagnosis not present

## 2019-03-21 DIAGNOSIS — K5790 Diverticulosis of intestine, part unspecified, without perforation or abscess without bleeding: Secondary | ICD-10-CM | POA: Insufficient documentation

## 2019-03-21 DIAGNOSIS — K760 Fatty (change of) liver, not elsewhere classified: Secondary | ICD-10-CM | POA: Insufficient documentation

## 2019-03-21 LAB — COMPREHENSIVE METABOLIC PANEL
ALT: 10 U/L (ref 0–35)
AST: 13 U/L (ref 0–37)
Albumin: 4.6 g/dL (ref 3.5–5.2)
Alkaline Phosphatase: 63 U/L (ref 39–117)
BUN: 10 mg/dL (ref 6–23)
CO2: 27 mEq/L (ref 19–32)
Calcium: 9.1 mg/dL (ref 8.4–10.5)
Chloride: 106 mEq/L (ref 96–112)
Creatinine, Ser: 0.84 mg/dL (ref 0.40–1.20)
GFR: 83.78 mL/min (ref >60.00)
Glucose, Bld: 87 mg/dL (ref 70–99)
Potassium: 4.1 mEq/L (ref 3.5–5.1)
Sodium: 140 mEq/L (ref 135–145)
Total Bilirubin: 0.4 mg/dL (ref 0.2–1.2)
Total Protein: 7.4 g/dL (ref 6.0–8.3)

## 2019-03-21 LAB — CBC WITH DIFFERENTIAL/PLATELET
Basophils Absolute: 0 10*3/uL (ref 0.0–0.1)
Basophils Relative: 0.8 % (ref 0.0–3.0)
Eosinophils Absolute: 0.1 10*3/uL (ref 0.0–0.7)
Eosinophils Relative: 3.1 % (ref 0.0–5.0)
HCT: 39.8 % (ref 36.0–46.0)
Hemoglobin: 13 g/dL (ref 12.0–15.0)
Lymphocytes Relative: 49.3 % — ABNORMAL HIGH (ref 12.0–46.0)
Lymphs Abs: 2.2 10*3/uL (ref 0.7–4.0)
MCHC: 32.6 g/dL (ref 30.0–36.0)
MCV: 90 fl (ref 78.0–100.0)
Monocytes Absolute: 0.4 10*3/uL (ref 0.1–1.0)
Monocytes Relative: 9.6 % (ref 3.0–12.0)
Neutro Abs: 1.7 10*3/uL (ref 1.4–7.7)
Neutrophils Relative %: 37.2 % — ABNORMAL LOW (ref 43.0–77.0)
Platelets: 342 10*3/uL (ref 150.0–400.0)
RBC: 4.42 Mil/uL (ref 3.87–5.11)
RDW: 12.2 % (ref 11.5–15.5)
WBC: 4.5 10*3/uL (ref 4.0–10.5)

## 2019-03-21 LAB — URINALYSIS, ROUTINE W REFLEX MICROSCOPIC
Bilirubin Urine: NEGATIVE
Hgb urine dipstick: NEGATIVE
Ketones, ur: NEGATIVE
Leukocytes,Ua: NEGATIVE
Nitrite: NEGATIVE
Specific Gravity, Urine: 1.015 (ref 1.000–1.030)
Total Protein, Urine: NEGATIVE
Urine Glucose: NEGATIVE
Urobilinogen, UA: 0.2 (ref 0.0–1.0)
pH: 7.5 (ref 5.0–8.0)

## 2019-03-21 MED ORDER — IOPAMIDOL (ISOVUE-300) INJECTION 61%
100.0000 mL | Freq: Once | INTRAVENOUS | Status: AC | PRN
  Administered 2019-03-21: 100 mL via INTRAVENOUS

## 2019-03-21 NOTE — Assessment & Plan Note (Signed)
Left lower quadrant-suprapubic pain Most likely diagnosis UTI or possible diverticulitis Besides the pain and mild nausea which is likely related to the pain she is not having any other symptoms have been no urinary symptoms or changes in bowels Will check urinalysis, urine culture, CMP, CBC If urinalysis is negative she will need to have a CT of the abdomen pelvis to rule out diverticulitis Further evaluation depending on the above

## 2019-03-21 NOTE — Telephone Encounter (Signed)
Her blood work is normal-her routine blood counts, kidney function liver tests are all normal.  The CT scan of her abdomen did not show a cause for her pain.  She does have some diverticulosis, which is little pouches in her colon, but this is not causing any of her symptoms.  She does have a fatty liver, which is fatty infiltration into the liver, but that is not causing any symptoms.  The remainder of her CT scan was normal-there is no explanation for her cough pain.  There is no visualization of any hernia, but given her history that is concerning for the possible pain.  As the pain is not getting better she may need to see surgery.  I recommend that she try taking something over-the-counter for the pain to see if that helps

## 2019-03-21 NOTE — Patient Instructions (Signed)
  Tests ordered today. Your results will be released to MyChart (or called to you) after review.  If any changes need to be made, you will be notified at that same time.   Medications reviewed and updated.  Changes include :   none     

## 2019-03-21 NOTE — Telephone Encounter (Signed)
Spoke with pt to inform.  

## 2019-03-23 LAB — URINE CULTURE
MICRO NUMBER:: 669788
Result:: NO GROWTH
SPECIMEN QUALITY:: ADEQUATE

## 2019-05-13 ENCOUNTER — Other Ambulatory Visit: Payer: Self-pay | Admitting: Internal Medicine

## 2019-07-24 ENCOUNTER — Other Ambulatory Visit: Payer: Self-pay

## 2019-07-24 ENCOUNTER — Other Ambulatory Visit (INDEPENDENT_AMBULATORY_CARE_PROVIDER_SITE_OTHER): Payer: BC Managed Care – PPO

## 2019-07-24 ENCOUNTER — Ambulatory Visit (INDEPENDENT_AMBULATORY_CARE_PROVIDER_SITE_OTHER): Payer: BC Managed Care – PPO | Admitting: Internal Medicine

## 2019-07-24 ENCOUNTER — Encounter: Payer: Self-pay | Admitting: Internal Medicine

## 2019-07-24 VITALS — BP 138/86 | HR 95 | Temp 98.5°F | Ht 65.0 in | Wt 193.0 lb

## 2019-07-24 DIAGNOSIS — R3915 Urgency of urination: Secondary | ICD-10-CM | POA: Diagnosis not present

## 2019-07-24 DIAGNOSIS — Z23 Encounter for immunization: Secondary | ICD-10-CM | POA: Diagnosis not present

## 2019-07-24 DIAGNOSIS — I10 Essential (primary) hypertension: Secondary | ICD-10-CM

## 2019-07-24 DIAGNOSIS — R350 Frequency of micturition: Secondary | ICD-10-CM

## 2019-07-24 DIAGNOSIS — R1032 Left lower quadrant pain: Secondary | ICD-10-CM | POA: Diagnosis not present

## 2019-07-24 LAB — HEPATIC FUNCTION PANEL
ALT: 10 U/L (ref 0–35)
AST: 13 U/L (ref 0–37)
Albumin: 4.6 g/dL (ref 3.5–5.2)
Alkaline Phosphatase: 59 U/L (ref 39–117)
Bilirubin, Direct: 0.1 mg/dL (ref 0.0–0.3)
Total Bilirubin: 0.4 mg/dL (ref 0.2–1.2)
Total Protein: 7.5 g/dL (ref 6.0–8.3)

## 2019-07-24 LAB — URINALYSIS, ROUTINE W REFLEX MICROSCOPIC
Bilirubin Urine: NEGATIVE
Ketones, ur: NEGATIVE
Leukocytes,Ua: NEGATIVE
Nitrite: NEGATIVE
Specific Gravity, Urine: 1.015 (ref 1.000–1.030)
Total Protein, Urine: NEGATIVE
Urine Glucose: NEGATIVE
Urobilinogen, UA: 0.2 (ref 0.0–1.0)
pH: 6 (ref 5.0–8.0)

## 2019-07-24 LAB — CBC WITH DIFFERENTIAL/PLATELET
Basophils Absolute: 0 10*3/uL (ref 0.0–0.1)
Basophils Relative: 0.8 % (ref 0.0–3.0)
Eosinophils Absolute: 0.2 10*3/uL (ref 0.0–0.7)
Eosinophils Relative: 3 % (ref 0.0–5.0)
HCT: 40.4 % (ref 36.0–46.0)
Hemoglobin: 13.4 g/dL (ref 12.0–15.0)
Lymphocytes Relative: 47.6 % — ABNORMAL HIGH (ref 12.0–46.0)
Lymphs Abs: 3 10*3/uL (ref 0.7–4.0)
MCHC: 33.1 g/dL (ref 30.0–36.0)
MCV: 89.4 fl (ref 78.0–100.0)
Monocytes Absolute: 0.4 10*3/uL (ref 0.1–1.0)
Monocytes Relative: 7.1 % (ref 3.0–12.0)
Neutro Abs: 2.6 10*3/uL (ref 1.4–7.7)
Neutrophils Relative %: 41.5 % — ABNORMAL LOW (ref 43.0–77.0)
Platelets: 366 10*3/uL (ref 150.0–400.0)
RBC: 4.53 Mil/uL (ref 3.87–5.11)
RDW: 12.3 % (ref 11.5–15.5)
WBC: 6.2 10*3/uL (ref 4.0–10.5)

## 2019-07-24 LAB — BASIC METABOLIC PANEL
BUN: 9 mg/dL (ref 6–23)
CO2: 28 mEq/L (ref 19–32)
Calcium: 9.4 mg/dL (ref 8.4–10.5)
Chloride: 105 mEq/L (ref 96–112)
Creatinine, Ser: 0.72 mg/dL (ref 0.40–1.20)
GFR: 99.98 mL/min (ref >60.00)
Glucose, Bld: 84 mg/dL (ref 70–99)
Potassium: 3.4 mEq/L — ABNORMAL LOW (ref 3.5–5.1)
Sodium: 140 mEq/L (ref 135–145)

## 2019-07-24 LAB — LIPASE: Lipase: 31 U/L (ref 11.0–59.0)

## 2019-07-24 MED ORDER — SOLIFENACIN SUCCINATE 5 MG PO TABS: 5.0000 mg | ORAL_TABLET | Freq: Every day | ORAL | 3 refills | Status: DC

## 2019-07-24 NOTE — Patient Instructions (Signed)
Please take all new medication as prescribed - the vesicare for the bladder  Please continue all other medications as before, and refills have been done if requested.  Please have the pharmacy call with any other refills you may need.  Please keep your appointments with your specialists as you may have planned  Please go to the LAB in the Basement (turn left off the elevator) for the tests to be done today  You will be contacted by phone if any changes need to be made immediately.  Otherwise, you will receive a letter about your results with an explanation, but please check with MyChart first.  Please remember to sign up for MyChart if you have not done so, as this will be important to you in the future with finding out test results, communicating by private email, and scheduling acute appointments online when needed.

## 2019-07-24 NOTE — Progress Notes (Signed)
Subjective:    Patient ID: Helen Young, female    DOB: [birth year], 58 y.o.   MRN: [id number]  HPI  Here with c/o acute onset LLQ pain and tender x 2 days, seems sort of sore and puffy to her, with soreness to the left groin as well without swelling or rash, but with urinary urgency, small volumes, no blood.  Denies urinary symptoms such as dysuria, frequency, flank pain, hematuria or n/v, fever, chills. Pt denies chest pain, increased sob or doe, wheezing, orthopnea, PND, increased LE swelling, palpitations, dizziness or syncope.  Pt denies new neurological symptoms such as new headache, or facial or extremity weakness or numbness   Pt denies polydipsia, polyuria Past Medical History:  Diagnosis Date  . Allergy   . Environmental allergies 02-27-13   OTC meds daily  . Headache(784.0) 02-27-13   occ. migraines  . HTN (hypertension)   . Transfusion history    childhood s/p Tonsillectomy   Past Surgical History:  Procedure Laterality Date  . ABDOMINAL HYSTERECTOMY    . BLADDER REPAIR    . BREAST REDUCTION SURGERY  03/16/2016  . CYSTOSTOMY N/A 03/02/2013   Procedure: CYSTOSTOMY CLOSURE ;  Surgeon: Alexis Frock, MD;  Location: [facility];  Service: Urology;  Laterality: N/A;  . INCISIONAL HERNIA REPAIR N/A 03/02/2013   Procedure: LAPAROSCOPIC INCISIONAL HERNIA CONVERTED TO OPEN ;  Surgeon: Harl Bowie, MD;  Location: [facility];  Service: General;  Laterality: N/A;  . INGUINAL HERNIA REPAIR     x's 2   . KNEE SURGERY Left    torn meniscus - arthroscopic repair  . NECK SURGERY     Disc replaced with a steel plate  . TONSILLECTOMY     childhoood  . TOTAL ABDOMINAL HYSTERECTOMY W/ BILATERAL SALPINGOOPHORECTOMY     prolapsed uterus at 79-58 years old, ovaries and tubes removed 10 years later due to cysts    reports that she quit smoking about 13 years ago. She has never used smokeless tobacco. She reports current alcohol use. She reports that she does not use drugs. family history  includes Colon cancer in her father; Diabetes in her maternal grandfather; Heart disease in her father and mother; Hypertension in her father and mother; Kidney disease in her maternal grandfather; Prostate cancer in her father; Stroke in her mother; Sudden death in her paternal grandfather and paternal uncle. Allergies  Allergen Reactions  . Fish Allergy Itching and Swelling  . Percocet [Oxycodone-Acetaminophen] Swelling   Current Outpatient Medications on File Prior to Visit  Medication Sig Dispense Refill  . amLODipine (NORVASC) 5 MG tablet TAKE 1 TABLET(5 MG) BY MOUTH DAILY 90 tablet 1  . traZODone (DESYREL) 50 MG tablet TAKE 1 TABLET(50 MG) BY MOUTH AT BEDTIME AS NEEDED FOR SLEEP 90 tablet 1  . valsartan-hydrochlorothiazide (DIOVAN HCT) 320-12.5 MG tablet Take 1 tablet by mouth daily. 90 tablet 3   No current facility-administered medications on file prior to visit.    Review of Systems  Constitutional: Negative for other unusual diaphoresis or sweats HENT: Negative for ear discharge or swelling Eyes: Negative for other worsening visual disturbances Respiratory: Negative for stridor or other swelling  Gastrointestinal: Negative for worsening distension or other blood Genitourinary: Negative for retention or other urinary change Musculoskeletal: Negative for other MSK pain or swelling Skin: Negative for color change or other new lesions Neurological: Negative for worsening tremors and other numbness  Psychiatric/Behavioral: Negative for worsening agitation or other fatigue All otherwise neg per pt  Objective:   Physical Exam BP 138/86   Pulse 95   Temp 98.5 F (36.9 C) (Oral)   Ht 5\' 5"  (1.651 m)   Wt 193 lb (87.5 kg)   SpO2 95%   BMI 32.12 kg/m  VS noted,  Constitutional: Pt appears in NAD HENT: Head: NCAT.  Right Ear: External ear normal.  Left Ear: External ear normal.  Eyes: . Pupils are equal, round, and reactive to light. Conjunctivae and EOM are normal Nose:  without d/c or deformity Neck: Neck supple. Gross normal ROM Cardiovascular: Normal rate and regular rhythm.   Pulmonary/Chest: Effort normal and breath sounds without rales or wheezing.  Abd:  Soft, ND, + BS, no organomegaly with mild LLQ tender, no groin swelling or mass Neurological: Pt is alert. At baseline orientation, motor grossly intact Skin: Skin is warm. No rashes, other new lesions, no LE edema Psychiatric: Pt behavior is normal without agitation  All otherwise neg per pt  Lab Results  Component Value Date   WBC 6.2 07/24/2019   HGB 13.4 07/24/2019   HCT 40.4 07/24/2019   PLT 366.0 07/24/2019   GLUCOSE 84 07/24/2019   CHOL 208 (H) 03/06/2019   TRIG 250.0 (H) 03/06/2019   HDL 55.50 03/06/2019   LDLDIRECT 118.0 03/06/2019   LDLCALC 129 (H) 12/01/2016   ALT 10 07/24/2019   AST 13 07/24/2019   NA 140 07/24/2019   K 3.4 (L) 07/24/2019   CL 105 07/24/2019   CREATININE 0.72 07/24/2019   BUN 9 07/24/2019   CO2 28 07/24/2019   TSH 0.95 03/06/2019        Assessment & Plan:

## 2019-07-26 LAB — URINE CULTURE
MICRO NUMBER:: 1109434
SPECIMEN QUALITY:: ADEQUATE

## 2019-07-29 ENCOUNTER — Encounter: Payer: Self-pay | Admitting: Internal Medicine

## 2019-07-29 DIAGNOSIS — R3915 Urgency of urination: Secondary | ICD-10-CM | POA: Insufficient documentation

## 2019-07-29 NOTE — Assessment & Plan Note (Addendum)
Mild, for labs as ordered including urine studies, consider CT if worsening

## 2019-07-29 NOTE — Assessment & Plan Note (Signed)
With frequency, exam benign overall , for trial vesicare asd,  to f/u any worsening symptoms or concerns

## 2019-07-29 NOTE — Assessment & Plan Note (Signed)
stable overall by history and exam, recent data reviewed with pt, and pt to continue medical treatment as before,  to f/u any worsening symptoms or concerns  

## 2019-08-28 ENCOUNTER — Encounter: Payer: Self-pay | Admitting: Gastroenterology

## 2019-09-07 DIAGNOSIS — K635 Polyp of colon: Secondary | ICD-10-CM

## 2019-09-07 HISTORY — DX: Polyp of colon: K63.5

## 2019-09-19 ENCOUNTER — Other Ambulatory Visit: Payer: Self-pay

## 2019-09-19 ENCOUNTER — Ambulatory Visit (AMBULATORY_SURGERY_CENTER): Payer: Self-pay

## 2019-09-19 ENCOUNTER — Encounter: Payer: Self-pay | Admitting: Gastroenterology

## 2019-09-19 VITALS — Temp 97.2°F | Ht 65.0 in | Wt 193.6 lb

## 2019-09-19 DIAGNOSIS — Z01818 Encounter for other preprocedural examination: Secondary | ICD-10-CM

## 2019-09-19 DIAGNOSIS — Z800 Family history of malignant neoplasm of digestive organs: Secondary | ICD-10-CM

## 2019-09-19 NOTE — Progress Notes (Signed)

## 2019-09-28 ENCOUNTER — Other Ambulatory Visit: Payer: Self-pay | Admitting: Gastroenterology

## 2019-09-28 ENCOUNTER — Ambulatory Visit (INDEPENDENT_AMBULATORY_CARE_PROVIDER_SITE_OTHER): Payer: BC Managed Care – PPO

## 2019-09-28 DIAGNOSIS — Z1159 Encounter for screening for other viral diseases: Secondary | ICD-10-CM

## 2019-10-01 LAB — SARS CORONAVIRUS 2 (TAT 6-24 HRS): SARS Coronavirus 2: NEGATIVE

## 2019-10-03 ENCOUNTER — Ambulatory Visit (AMBULATORY_SURGERY_CENTER): Payer: BC Managed Care – PPO | Admitting: Gastroenterology

## 2019-10-03 ENCOUNTER — Encounter: Payer: Self-pay | Admitting: Gastroenterology

## 2019-10-03 ENCOUNTER — Other Ambulatory Visit: Payer: Self-pay

## 2019-10-03 ENCOUNTER — Encounter: Payer: BC Managed Care – PPO | Admitting: Gastroenterology

## 2019-10-03 VITALS — BP 132/81 | HR 71 | Temp 97.1°F | Resp 15 | Ht 65.0 in | Wt 193.6 lb

## 2019-10-03 DIAGNOSIS — D122 Benign neoplasm of ascending colon: Secondary | ICD-10-CM

## 2019-10-03 DIAGNOSIS — Z1211 Encounter for screening for malignant neoplasm of colon: Secondary | ICD-10-CM

## 2019-10-03 DIAGNOSIS — Z800 Family history of malignant neoplasm of digestive organs: Secondary | ICD-10-CM | POA: Diagnosis not present

## 2019-10-03 MED ORDER — SODIUM CHLORIDE 0.9 % IV SOLN: 500.0000 mL | Freq: Once | INTRAVENOUS | Status: DC

## 2019-10-03 NOTE — Progress Notes (Signed)
Pt's states no medical or surgical changes since previsit or office visit.  Temp JB VS KA

## 2019-10-03 NOTE — Op Note (Signed)
[facility] Patient Name: Helen Young Procedure Date: 10/03/2019 10:31 AM MRN: [id number] Endoscopist: Milus Banister , MD Age: 59 Referring MD:  Date of Birth: [birth year] Gender: Female Account #: [id number] Procedure:                Colonoscopy Indications:              Screening in patient at increased risk: Family                            history of 1st-degree relative with colorectal                            cancer before age 44 years (father diagnosed in his                            62s) Medicines:                Monitored Anesthesia Care Procedure:                Pre-Anesthesia Assessment:                           - Prior to the procedure, a History and Physical                            was performed, and patient medications and                            allergies were reviewed. The patient's tolerance of                            previous anesthesia was also reviewed. The risks                            and benefits of the procedure and the sedation                            options and risks were discussed with the patient.                            All questions were answered, and informed consent                            was obtained. Prior Anticoagulants: The patient has                            taken no previous anticoagulant or antiplatelet                            agents. ASA Grade Assessment: II - A patient with                            mild systemic disease. After reviewing the risks  and benefits, the patient was deemed in                            satisfactory condition to undergo the procedure.                           After obtaining informed consent, the colonoscope                            was passed under direct vision. Throughout the                            procedure, the patient's blood pressure, pulse, and                            oxygen saturations were monitored continuously. The                     Colonoscope was introduced through the anus and                            advanced to the the cecum, identified by                            appendiceal orifice and ileocecal valve. The                            colonoscopy was performed without difficulty. The                            patient tolerated the procedure well. The quality                            of the bowel preparation was good. The ileocecal                            valve, appendiceal orifice, and rectum were                            photographed. Scope In: 10:34:59 AM Scope Out: 10:50:11 AM Scope Withdrawal Time: 0 hours 9 minutes 19 seconds  Total Procedure Duration: 0 hours 15 minutes 12 seconds  Findings:                 A 4 mm polyp was found in the ascending colon. The                            polyp was sessile. The polyp was removed with a                            cold snare. Resection and retrieval were complete.                           The exam was otherwise without abnormality on  direct and retroflexion views. Complications:            No immediate complications. Estimated blood loss:                            None. Estimated Blood Loss:     Estimated blood loss: none. Impression:               - One 4 mm polyp in the ascending colon, removed                            with a cold snare. Resected and retrieved.                           - The examination was otherwise normal on direct                            and retroflexion views. Recommendation:           - Patient has a contact number available for                            emergencies. The signs and symptoms of potential                            delayed complications were discussed with the                            patient. Return to normal activities tomorrow.                            Written discharge instructions were provided to the                            patient.                            - Resume previous diet.                           - Continue present medications.                           - Await pathology results. Milus Banister, MD 10/03/2019 10:52:03 AM This report has been signed electronically.

## 2019-10-03 NOTE — Progress Notes (Signed)
Called to room to assist during endoscopic procedure.  Patient ID and intended procedure confirmed with present staff. Received instructions for my participation in the procedure from the performing physician.  

## 2019-10-03 NOTE — Patient Instructions (Signed)
YOU HAD AN ENDOSCOPIC PROCEDURE TODAY AT [facility]:   Refer to the procedure report that was given to you for any specific questions about what was found during the examination.  If the procedure report does not answer your questions, please call your gastroenterologist to clarify.  If you requested that your care partner not be given the details of your procedure findings, then the procedure report has been included in a sealed envelope for you to review at your convenience later.  YOU SHOULD EXPECT: Some feelings of bloating in the abdomen. Passage of more gas than usual.  Walking can help get rid of the air that was put into your GI tract during the procedure and reduce the bloating. If you had a lower endoscopy (such as a colonoscopy or flexible sigmoidoscopy) you may notice spotting of blood in your stool or on the toilet paper. If you underwent a bowel prep for your procedure, you may not have a normal bowel movement for a few days.  Please Note:  You might notice some irritation and congestion in your nose or some drainage.  This is from the oxygen used during your procedure.  There is no need for concern and it should clear up in a day or so.  SYMPTOMS TO REPORT IMMEDIATELY:   Following lower endoscopy (colonoscopy or flexible sigmoidoscopy):  Excessive amounts of blood in the stool  Significant tenderness or worsening of abdominal pains  Swelling of the abdomen that is new, acute  Fever of 100F or higher    For urgent or emergent issues, a gastroenterologist can be reached at any hour by calling [phone].   DIET:  We do recommend a small meal at first, but then you may proceed to your regular diet.  Drink plenty of fluids but you should avoid alcoholic beverages for 24 hours.  ACTIVITY:  You should plan to take it easy for the rest of today and you should NOT DRIVE or use heavy machinery until tomorrow (because of the sedation medicines used during the test).     FOLLOW UP: Our staff will call the number listed on your records 48-72 hours following your procedure to check on you and address any questions or concerns that you may have regarding the information given to you following your procedure. If we do not reach you, we will leave a message.  We will attempt to reach you two times.  During this call, we will ask if you have developed any symptoms of COVID 19. If you develop any symptoms (ie: fever, flu-like symptoms, shortness of breath, cough etc.) before then, please call [phone].  If you test positive for Covid 19 in the 2 weeks post procedure, please call and report this information to [place].    If any biopsies were taken you will be contacted by phone or by letter within the next 1-3 weeks.  Please call us at [phone] if you have not heard about the biopsies in 3 weeks.    SIGNATURES/CONFIDENTIALITY: You and/or your care partner have signed paperwork which will be entered into your electronic medical record.  These signatures attest to the fact that that the information above on your After Visit Summary has been reviewed and is understood.  Full responsibility of the confidentiality of this discharge information lies with you and/or your care-partner.   Resume medications. Information given on polyps

## 2019-10-03 NOTE — Progress Notes (Signed)
Report to PACU, RN, vss, BBS= Clear.  

## 2019-10-05 ENCOUNTER — Telehealth: Payer: Self-pay

## 2019-10-05 NOTE — Telephone Encounter (Signed)
  Follow up Call-  Call back number 10/03/2019  Post procedure Call Back phone  # [phone]  Permission to leave phone message Yes  Some recent data might be hidden     Left message

## 2019-10-05 NOTE — Telephone Encounter (Signed)
No answer, left message to call if having any issues or concerns, B.Ayiana Winslett RN 

## 2019-10-09 ENCOUNTER — Encounter: Payer: Self-pay | Admitting: Gastroenterology

## 2019-12-09 NOTE — Progress Notes (Signed)
Subjective:    Patient ID: Helen Young, female    DOB: [birth year], 59 y.o.   MRN: [id number]  HPI The patient is here for an acute visit.   LLQ pain, nausea:  She is still having LLQ discomfort.  This started in July 2020.  She had a colonoscopy with Dr Ardis Hughs and except for a very small polyp it was normal.  She has associated nausea that occurs with the pain.  She feels the pain daily.    Laying on her right side relieves the pressure.  Not being on her feet a lot and not working helps.  Working, standing on her feet and being active increases the pressure.  The pain has gotten worse since it started several months ago - it is more intense.   Walking can be painful at times.  Something she feels a pressure that goes into her groin or leg.  She can sometimes feel a bulge.    She has had a hysterectomy, bladder repair.  She has h/o of a hernia where the pain was and had that repaired twice.  This feels similar.    Medications and allergies reviewed with patient and updated if appropriate.  Patient Active Problem List   Diagnosis Date Noted  . Urinary urgency 07/29/2019  . LLQ pain 03/21/2019  . Diverticulosis 03/21/2019  . Fatty liver 03/21/2019  . Stye 03/06/2019  . 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 Lower back pain 08/23/2018  . Right rotator cuff tear 05/19/2017  . Acute pain of right shoulder 05/18/2017  . Vitamin D deficiency 12/01/2016  . Sleep difficulties 08/11/2016  . Numbness in left leg 07/16/2015  . Back pain 12/06/2014  . Obesity (BMI 30-39.9) 10/15/2013  . Recurrent ventral incisional hernia 02/09/2013  . Thoracic back pain 10/09/2012  . HTN (hypertension) 04/06/2012  . Scoliosis 04/06/2012  . Environmental allergies 04/06/2012    Current Outpatient Medications on File Prior to Visit  Medication Sig Dispense Refill  . amLODipine (NORVASC) 5 MG tablet TAKE 1 TABLET(5 MG) BY MOUTH DAILY 90 tablet 1  . Calcium Citrate-Vitamin D (CALCIUM CITRATE + D3)  200-250 MG-UNIT TABS Take 1 capsule by mouth daily.    . potassium gluconate (HM POTASSIUM) 595 (99 K) MG TABS tablet Take 595 mg by mouth daily.    . solifenacin (VESICARE) 5 MG tablet Take 1 tablet (5 mg total) by mouth daily. 90 tablet 3  . traZODone (DESYREL) 50 MG tablet TAKE 1 TABLET(50 MG) BY MOUTH AT BEDTIME AS NEEDED FOR SLEEP 90 tablet 1  . valsartan-hydrochlorothiazide (DIOVAN HCT) 320-12.5 MG tablet Take 1 tablet by mouth daily. 90 tablet 3  . vitamin B-12 (CYANOCOBALAMIN) 500 MCG tablet Take 500 mcg by mouth daily.     No current facility-administered medications on file prior to visit.    Past Medical History:  Diagnosis Date  . Allergy    seasonal, environmental  . Environmental allergies 02-27-13   OTC meds daily  . Headache(784.0) 02-27-13   occ. migraines  . HTN (hypertension)   . Transfusion history    childhood s/p Tonsillectomy    Past Surgical History:  Procedure Laterality Date  . ABDOMINAL HYSTERECTOMY    . BLADDER REPAIR    . BREAST REDUCTION SURGERY  03/16/2016  . COLONOSCOPY    . CYSTOSTOMY N/A 03/02/2013   Procedure: CYSTOSTOMY CLOSURE ;  Surgeon: Alexis Frock, MD;  Location: [facility];  Service: Urology;  Laterality: N/A;  . INCISIONAL HERNIA REPAIR N/A 03/02/2013  Procedure: LAPAROSCOPIC INCISIONAL HERNIA CONVERTED TO OPEN ;  Surgeon: Harl Bowie, MD;  Location: [facility];  Service: General;  Laterality: N/A;  . INGUINAL HERNIA REPAIR     x's 2   . KNEE SURGERY Left    torn meniscus - arthroscopic repair  . NECK SURGERY     Disc replaced with a steel plate  . TONSILLECTOMY     childhoood  . TOTAL ABDOMINAL HYSTERECTOMY W/ BILATERAL SALPINGOOPHORECTOMY     prolapsed uterus at 35-3 years old, ovaries and tubes removed 10 years later due to cysts    Social History   Socioeconomic History  . Marital status: Single    Spouse name: Not on file  . Number of children: 3  . Years of education: Not on file  . Highest education level: Not on  file  Occupational History  . Occupation: PROGRAM Advertising account executive: [employer]   Tobacco Use  . Smoking status: Former Smoker    Quit date: 09/06/2005    Years since quitting: 14.2  . Smokeless tobacco: Never Used  Substance and Sexual Activity  . Alcohol use: Yes    Alcohol/week: 0.0 standard drinks    Comment: Rarely  . Drug use: No  . Sexual activity: Yes  Other Topics Concern  . Not on file  Social History Narrative   No regular exercise right now   Social Determinants of Health   Financial Resource Strain:   . Difficulty of Paying Living Expenses:   Food Insecurity:   . Worried About Charity fundraiser in the Last Year:   . Arboriculturist in the Last Year:   Transportation Needs:   . Film/video editor (Medical):   Marland Kitchen Lack of Transportation (Non-Medical):   Physical Activity:   . Days of Exercise per Week:   . Minutes of Exercise per Session:   Stress:   . Feeling of Stress :   Social Connections:   . Frequency of Communication with Friends and Family:   . Frequency of Social Gatherings with Friends and Family:   . Attends Religious Services:   . Active Member of Clubs or Organizations:   . Attends Archivist Meetings:   Marland Kitchen Marital Status:     Family History  Problem Relation Age of Onset  . Colon cancer Father   . Prostate cancer Father   . Heart disease Father   . Hypertension Father   . Colon polyps Father   . Heart disease Mother   . Stroke Mother   . Hypertension Mother   . Kidney disease Maternal Grandfather   . Diabetes Maternal Grandfather   . Sudden death Paternal Grandfather   . Sudden death Paternal Uncle        Cardiac issues  . Esophageal cancer Neg Hx   . Rectal cancer Neg Hx   . Stomach cancer Neg Hx     Review of Systems  Constitutional: Negative for chills and fever.  Gastrointestinal: Positive for abdominal pain and nausea (related to pain). Negative for blood in stool (no black stool), constipation and  diarrhea.       No gerd  Genitourinary: Negative for dysuria, frequency and hematuria.       Objective:   Vitals:   12/10/19 0810  BP: (!) 154/108  Pulse: 86  Temp: 98 F (36.7 C)  SpO2: 98%   BP Readings from Last 3 Encounters:  12/10/19 (!) 154/108  10/03/19 132/81  07/24/19 138/86  Wt Readings from Last 3 Encounters:  12/10/19 195 lb (88.5 kg)  10/03/19 193 lb 9.6 oz (87.8 kg)  09/19/19 193 lb 9.6 oz (87.8 kg)   Body mass index is 32.45 kg/m.   Physical Exam Constitutional:      General: She is not in acute distress.    Appearance: Normal appearance. She is not ill-appearing.  HENT:     Head: Normocephalic and atraumatic.  Abdominal:     General: There is no distension.     Palpations: Abdomen is soft. There is no mass.     Tenderness: There is abdominal tenderness (LLQ at medial aspect of scar in LLQ). There is no guarding or rebound.     Hernia: No hernia (no palpable hernia) is present.  Skin:    General: Skin is warm and dry.  Neurological:     Mental Status: She is alert.            Assessment & Plan:    See Problem List for Assessment and Plan of chronic medical problems.    This visit occurred during the SARS-CoV-2 public health emergency.  Safety protocols were in place, including screening questions prior to the visit, additional usage of staff PPE, and extensive cleaning of exam room while observing appropriate contact time as indicated for disinfecting solutions.

## 2019-12-10 ENCOUNTER — Encounter: Payer: Self-pay | Admitting: Internal Medicine

## 2019-12-10 ENCOUNTER — Other Ambulatory Visit: Payer: Self-pay

## 2019-12-10 ENCOUNTER — Ambulatory Visit: Payer: BC Managed Care – PPO | Admitting: Internal Medicine

## 2019-12-10 VITALS — BP 154/108 | HR 86 | Temp 98.0°F | Ht 65.0 in | Wt 195.0 lb

## 2019-12-10 DIAGNOSIS — R1032 Left lower quadrant pain: Secondary | ICD-10-CM

## 2019-12-10 NOTE — Patient Instructions (Signed)
   A referral was ordered for surgery.    Someone will call you to schedule this.

## 2019-12-10 NOTE — Assessment & Plan Note (Signed)
Chronic - started in July LLQ pressure/pain associated with nausea ( from pain) Likely recurrent hernia, which has been repaired x 2 CT last July did not show a hernia - pain is worse and it may be evident on imaging now, but I will hold off and refer her to surgery for them to evaluate her and see if she needs imaging Referral ordered Avoid lifting and activities that increase pain

## 2020-03-31 ENCOUNTER — Other Ambulatory Visit: Payer: Self-pay | Admitting: Surgery

## 2020-03-31 DIAGNOSIS — K432 Incisional hernia without obstruction or gangrene: Secondary | ICD-10-CM

## 2020-04-23 ENCOUNTER — Other Ambulatory Visit: Payer: BC Managed Care – PPO

## 2020-05-10 ENCOUNTER — Ambulatory Visit
Admission: RE | Admit: 2020-05-10 | Discharge: 2020-05-10 | Disposition: A | Discharge status: Home / Self Care | Payer: BC Managed Care – PPO | Source: Ambulatory Visit | Attending: Surgery | Admitting: Surgery

## 2020-05-10 DIAGNOSIS — K432 Incisional hernia without obstruction or gangrene: Secondary | ICD-10-CM

## 2020-05-10 MED ORDER — GADOBENATE DIMEGLUMINE 529 MG/ML IV SOLN
18.0000 mL | Freq: Once | INTRAVENOUS | Status: AC | PRN
  Administered 2020-05-10: 18 mL via INTRAVENOUS

## 2020-05-13 ENCOUNTER — Other Ambulatory Visit: Payer: Self-pay | Admitting: Surgery

## 2020-05-13 DIAGNOSIS — K432 Incisional hernia without obstruction or gangrene: Secondary | ICD-10-CM

## 2020-05-14 ENCOUNTER — Other Ambulatory Visit: Payer: Self-pay | Admitting: Surgery

## 2020-05-14 ENCOUNTER — Ambulatory Visit
Admission: RE | Admit: 2020-05-14 | Discharge: 2020-05-14 | Disposition: A | Discharge status: Home / Self Care | Payer: BC Managed Care – PPO | Source: Ambulatory Visit | Attending: Surgery | Admitting: Surgery

## 2020-05-14 ENCOUNTER — Other Ambulatory Visit: Payer: Self-pay

## 2020-05-14 DIAGNOSIS — K432 Incisional hernia without obstruction or gangrene: Secondary | ICD-10-CM

## 2020-05-14 DIAGNOSIS — R1032 Left lower quadrant pain: Secondary | ICD-10-CM

## 2020-05-14 MED ORDER — GADOBENATE DIMEGLUMINE 529 MG/ML IV SOLN
18.0000 mL | Freq: Once | INTRAVENOUS | Status: AC | PRN
  Administered 2020-05-14: 18 mL via INTRAVENOUS

## 2020-05-16 ENCOUNTER — Other Ambulatory Visit: Payer: BC Managed Care – PPO

## 2020-06-03 ENCOUNTER — Other Ambulatory Visit: Payer: BC Managed Care – PPO

## 2020-07-29 NOTE — Progress Notes (Signed)
Subjective:    Patient ID: Helen Young, female    DOB: February 19, 1961, 59 y.o.   MRN: [id number]  HPI The patient is here for an acute visit for weight, maintaining low BP, avoiding stress.  At work by [employer] - so does not eat prior to working.   Does not eat lunch.  In the afternoon she eats - usually picks up something to eat or cooks when she gets home.    BP at home 137/95  She is exercising intermittently.   She does not feel stressed, but at times has had a feeling of stress or tightness in her chest.  She is working as a Pharmacist, hospital, has a PT job and is in school.  She realizes she probably has some stress.  She does not feel like it is an issue.   Medications and allergies reviewed with patient and updated if appropriate.  Patient Active Problem List   Diagnosis Date Noted  . Urinary urgency 07/29/2019  . LLQ pain 03/21/2019  . Diverticulosis 03/21/2019  . Fatty liver 03/21/2019  . Stye 03/06/2019  . 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 Lower back pain 08/23/2018  . Right rotator cuff tear 05/19/2017  . Vitamin D deficiency 12/01/2016  . Sleep difficulties 08/11/2016  . Numbness in left leg 07/16/2015  . Back pain 12/06/2014  . Obesity (BMI 30-39.9) 10/15/2013  . Recurrent ventral incisional hernia 02/09/2013  . Thoracic back pain 10/09/2012  . HTN (hypertension) 04/06/2012  . Scoliosis 04/06/2012  . Environmental allergies 04/06/2012    Current Outpatient Medications on File Prior to Visit  Medication Sig Dispense Refill  . amLODipine (NORVASC) 5 MG tablet TAKE 1 TABLET(5 MG) BY MOUTH DAILY 90 tablet 1  . Calcium Citrate-Vitamin D (CALCIUM CITRATE + D3) 200-250 MG-UNIT TABS Take 1 capsule by mouth daily.    . potassium gluconate (HM POTASSIUM) 595 (99 K) MG TABS tablet Take 595 mg by mouth daily.    . traZODone (DESYREL) 50 MG tablet TAKE 1 TABLET(50 MG) BY MOUTH AT BEDTIME AS NEEDED FOR SLEEP 90 tablet 1  . vitamin B-12 (CYANOCOBALAMIN) 500 MCG tablet Take 500 mcg by  mouth daily.     No current facility-administered medications on file prior to visit.    Past Medical History:  Diagnosis Date  . Allergy    seasonal, environmental  . Environmental allergies 02-27-13   OTC meds daily  . Headache(784.0) 02-27-13   occ. migraines  . HTN (hypertension)   . Transfusion history    childhood s/p Tonsillectomy    Past Surgical History:  Procedure Laterality Date  . ABDOMINAL HYSTERECTOMY    . BLADDER REPAIR    . BREAST REDUCTION SURGERY  03/16/2016  . COLONOSCOPY    . CYSTOSTOMY N/A 03/02/2013   Procedure: CYSTOSTOMY CLOSURE ;  Surgeon: Alexis Frock, MD;  Location: [facility];  Service: Urology;  Laterality: N/A;  . INCISIONAL HERNIA REPAIR N/A 03/02/2013   Procedure: LAPAROSCOPIC INCISIONAL HERNIA CONVERTED TO OPEN ;  Surgeon: Harl Bowie, MD;  Location: [facility];  Service: General;  Laterality: N/A;  . INGUINAL HERNIA REPAIR     x's 2   . KNEE SURGERY Left    torn meniscus - arthroscopic repair  . NECK SURGERY     Disc replaced with a steel plate  . TONSILLECTOMY     childhoood  . TOTAL ABDOMINAL HYSTERECTOMY W/ BILATERAL SALPINGOOPHORECTOMY     prolapsed uterus at 109-51 years old, ovaries and tubes removed 10  years later due to cysts    Social History   Socioeconomic History  . Marital status: Single    Spouse name: Not on file  . Number of children: 3  . Years of education: Not on file  . Highest education level: Not on file  Occupational History  . Occupation: PROGRAM Advertising account executive: [employer]  Tobacco Use  . Smoking status: Former Smoker    Quit date: 09/06/2005    Years since quitting: 14.9  . Smokeless tobacco: Never Used  Vaping Use  . Vaping Use: Never used  Substance and Sexual Activity  . Alcohol use: Yes    Alcohol/week: 0.0 standard drinks    Comment: Rarely  . Drug use: No  . Sexual activity: Yes  Other Topics Concern  . Not on file  Social History Narrative   No regular exercise right now   Social  Determinants of Health   Financial Resource Strain:   . Difficulty of Paying Living Expenses: Not on file  Food Insecurity:   . Worried About Charity fundraiser in the Last Year: Not on file  . Ran Out of Food in the Last Year: Not on file  Transportation Needs:   . Lack of Transportation (Medical): Not on file  . Lack of Transportation (Non-Medical): Not on file  Physical Activity:   . Days of Exercise per Week: Not on file  . Minutes of Exercise per Session: Not on file  Stress:   . Feeling of Stress : Not on file  Social Connections:   . Frequency of Communication with Friends and Family: Not on file  . Frequency of Social Gatherings with Friends and Family: Not on file  . Attends Religious Services: Not on file  . Active Member of Clubs or Organizations: Not on file  . Attends Archivist Meetings: Not on file  . Marital Status: Not on file    Family History  Problem Relation Age of Onset  . Colon cancer Father   . Prostate cancer Father   . Heart disease Father   . Hypertension Father   . Colon polyps Father   . Heart disease Mother   . Stroke Mother   . Hypertension Mother   . Kidney disease Maternal Grandfather   . Diabetes Maternal Grandfather   . Sudden death Paternal Grandfather   . Sudden death Paternal Uncle        Cardiac issues  . Esophageal cancer Neg Hx   . Rectal cancer Neg Hx   . Stomach cancer Neg Hx     Review of Systems  Constitutional: Negative for fever.  Eyes: Negative for visual disturbance.  Respiratory: Negative for cough, shortness of breath and wheezing.   Cardiovascular: Negative for chest pain, palpitations and leg swelling.  Neurological: Positive for light-headedness (once). Negative for headaches.  Psychiatric/Behavioral: Positive for sleep disturbance (tossing and turning). Negative for dysphoric mood. The patient is nervous/anxious (mild).        Objective:   Vitals:   07/30/20 0823 07/30/20 0858  BP: (!) 148/100  138/82  Pulse: 83   Temp: 98.1 F (36.7 C)   SpO2: 98%    BP Readings from Last 3 Encounters:  07/30/20 138/82  12/10/19 (!) 154/108  10/03/19 132/81   Wt Readings from Last 3 Encounters:  07/30/20 189 lb (85.7 kg)  12/10/19 195 lb (88.5 kg)  10/03/19 193 lb 9.6 oz (87.8 kg)   Body mass index is 31.45 kg/m.  Physical Exam    Constitutional: Appears well-developed and well-nourished. No distress.  Head: Normocephalic and atraumatic.  Neck: Neck supple. No tracheal deviation present. No thyromegaly present.  No cervical lymphadenopathy Cardiovascular: Normal rate, regular rhythm and normal heart sounds.  No murmur heard. No carotid bruit .  No edema Pulmonary/Chest: Effort normal and breath sounds normal. No respiratory distress. No has no wheezes. No rales.  Skin: Skin is warm and dry. Not diaphoretic.  Psychiatric: Normal mood and affect. Behavior is normal.       Assessment & Plan:    See Problem List for Assessment and Plan of chronic medical problems.    This visit occurred during the SARS-CoV-2 public health emergency.  Safety protocols were in place, including screening questions prior to the visit, additional usage of staff PPE, and extensive cleaning of exam room while observing appropriate contact time as indicated for disinfecting solutions.

## 2020-07-30 ENCOUNTER — Other Ambulatory Visit: Payer: Self-pay

## 2020-07-30 ENCOUNTER — Encounter: Payer: Self-pay | Admitting: Internal Medicine

## 2020-07-30 ENCOUNTER — Ambulatory Visit: Payer: BC Managed Care – PPO | Admitting: Internal Medicine

## 2020-07-30 VITALS — BP 138/82 | HR 83 | Temp 98.1°F | Ht 65.0 in | Wt 189.0 lb

## 2020-07-30 DIAGNOSIS — Z23 Encounter for immunization: Secondary | ICD-10-CM

## 2020-07-30 DIAGNOSIS — G479 Sleep disorder, unspecified: Secondary | ICD-10-CM | POA: Diagnosis not present

## 2020-07-30 DIAGNOSIS — E669 Obesity, unspecified: Secondary | ICD-10-CM | POA: Diagnosis not present

## 2020-07-30 DIAGNOSIS — I10 Essential (primary) hypertension: Secondary | ICD-10-CM | POA: Diagnosis not present

## 2020-07-30 LAB — COMPREHENSIVE METABOLIC PANEL
ALT: 12 U/L (ref 0–35)
AST: 16 U/L (ref 0–37)
Albumin: 4.4 g/dL (ref 3.5–5.2)
Alkaline Phosphatase: 49 U/L (ref 39–117)
BUN: 10 mg/dL (ref 6–23)
CO2: 31 mEq/L (ref 19–32)
Calcium: 9.3 mg/dL (ref 8.4–10.5)
Chloride: 104 mEq/L (ref 96–112)
Creatinine, Ser: 0.79 mg/dL (ref 0.40–1.20)
GFR: 80.97 mL/min (ref >60.00)
Glucose, Bld: 80 mg/dL (ref 70–99)
Potassium: 4.2 mEq/L (ref 3.5–5.1)
Sodium: 140 mEq/L (ref 135–145)
Total Bilirubin: 0.7 mg/dL (ref 0.2–1.2)
Total Protein: 7.2 g/dL (ref 6.0–8.3)

## 2020-07-30 MED ORDER — VALSARTAN-HYDROCHLOROTHIAZIDE 320-25 MG PO TABS: 1.0000 | ORAL_TABLET | Freq: Every day | ORAL | 5 refills | Status: DC

## 2020-07-30 NOTE — Patient Instructions (Addendum)
  Blood work was ordered.     Flu immunization administered today.     Medications changes include :   Increase valsartan-hctz to 320 -25 mg daily  Your prescription(s) have been submitted to your pharmacy. Please take as directed and contact our office if you believe you are having problem(s) with the medication(s).    Please followup in 4-8 weeks

## 2020-07-30 NOTE — Addendum Note (Signed)
Addended by: Lerry Liner on: 07/30/2020 09:04 AM   Modules accepted: Orders

## 2020-07-30 NOTE — Assessment & Plan Note (Signed)
Chronic Sleep quality is not always good - taking trazodone 50mg  HS prn - advised to take as needed and ok to take nightly Continue trazodone 50 mg HS prn

## 2020-07-30 NOTE — Assessment & Plan Note (Signed)
Chronic BP not controlled Continue amlodipine 5 mg daily.  Increase valsartan-hctz to 320-25 mg daily cmp Monitor BP F/u in 4-8 weeks to recheck BP /CPE

## 2020-07-30 NOTE — Assessment & Plan Note (Signed)
Chronic Working on weight loss - has lost 6lbs Exercising intermittently - exercise as much as possible Discussed diet and eating habits

## 2020-07-30 NOTE — Addendum Note (Signed)
Addended by: Marcina Millard on: 07/30/2020 01:36 PM   Modules accepted: Orders

## 2020-08-05 ENCOUNTER — Other Ambulatory Visit: Payer: Self-pay | Admitting: Internal Medicine

## 2020-08-26 NOTE — Patient Instructions (Addendum)
   Medications changes include :   none    Please followup in 6 months   

## 2020-08-26 NOTE — Progress Notes (Signed)
Subjective:    Patient ID: Helen Young, female    DOB: 10/20/1960, 59 y.o.   MRN: [id number]  HPI The patient is here for follow up of their chronic medical problems, including htn, obesity  One month ago we started amlodipine 5 mg and increased her valsartan-hctz to 320-25 mg   She is taking all of her medications as prescribed.    She started a dance fitness program that she likes.   Medications and allergies reviewed with patient and updated if appropriate.  Patient Active Problem List   Diagnosis Date Noted  . Urinary urgency 07/29/2019  . LLQ pain 03/21/2019  . Diverticulosis 03/21/2019  . Fatty liver 03/21/2019  . 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 Lower back pain 08/23/2018  . Right rotator cuff tear 05/19/2017  . Vitamin D deficiency 12/01/2016  . Sleep difficulties 08/11/2016  . Numbness in left leg 07/16/2015  . Back pain 12/06/2014  . Obesity (BMI 30-39.9) 10/15/2013  . Recurrent ventral incisional hernia 02/09/2013  . Thoracic back pain 10/09/2012  . HTN (hypertension) 04/06/2012  . Scoliosis 04/06/2012  . Environmental allergies 04/06/2012    Current Outpatient Medications on File Prior to Visit  Medication Sig Dispense Refill  . amLODipine (NORVASC) 5 MG tablet TAKE 1 TABLET(5 MG) BY MOUTH DAILY 90 tablet 1  . Calcium Citrate-Vitamin D (CALCIUM CITRATE + D3) 200-250 MG-UNIT TABS Take 1 capsule by mouth daily.    . potassium gluconate 595 (99 K) MG TABS tablet Take 595 mg by mouth daily.    . traZODone (DESYREL) 50 MG tablet TAKE 1 TABLET(50 MG) BY MOUTH AT BEDTIME AS NEEDED FOR SLEEP 90 tablet 1  . valsartan-hydrochlorothiazide (DIOVAN-HCT) 320-25 MG tablet Take 1 tablet by mouth daily. 30 tablet 5  . vitamin B-12 (CYANOCOBALAMIN) 500 MCG tablet Take 500 mcg by mouth daily.     No current facility-administered medications on file prior to visit.    Past Medical History:  Diagnosis Date  . Allergy    seasonal, environmental  . Environmental  allergies 02-27-13   OTC meds daily  . Headache(784.0) 02-27-13   occ. migraines  . HTN (hypertension)   . Transfusion history    childhood s/p Tonsillectomy    Past Surgical History:  Procedure Laterality Date  . ABDOMINAL HYSTERECTOMY    . BLADDER REPAIR    . BREAST REDUCTION SURGERY  03/16/2016  . COLONOSCOPY    . CYSTOSTOMY N/A 03/02/2013   Procedure: CYSTOSTOMY CLOSURE ;  Surgeon: Alexis Frock, MD;  Location: [facility];  Service: Urology;  Laterality: N/A;  . INCISIONAL HERNIA REPAIR N/A 03/02/2013   Procedure: LAPAROSCOPIC INCISIONAL HERNIA CONVERTED TO OPEN ;  Surgeon: Harl Bowie, MD;  Location: [facility];  Service: General;  Laterality: N/A;  . INGUINAL HERNIA REPAIR     x's 2   . KNEE SURGERY Left    torn meniscus - arthroscopic repair  . NECK SURGERY     Disc replaced with a steel plate  . TONSILLECTOMY     childhoood  . TOTAL ABDOMINAL HYSTERECTOMY W/ BILATERAL SALPINGOOPHORECTOMY     prolapsed uterus at 54-40 years old, ovaries and tubes removed 10 years later due to cysts    Social History   Socioeconomic History  . Marital status: Single    Spouse name: Not on file  . Number of children: 3  . Years of education: Not on file  . Highest education level: Not on file  Occupational History  . Occupation:  PROGRAM COODINATIOR    Employer: [employer]  Tobacco Use  . Smoking status: Former Smoker    Quit date: 09/06/2005    Years since quitting: 14.9  . Smokeless tobacco: Never Used  Vaping Use  . Vaping Use: Never used  Substance and Sexual Activity  . Alcohol use: Yes    Alcohol/week: 0.0 standard drinks    Comment: Rarely  . Drug use: No  . Sexual activity: Yes  Other Topics Concern  . Not on file  Social History Narrative   No regular exercise right now   Social Determinants of Health   Financial Resource Strain: Not on file  Food Insecurity: Not on file  Transportation Needs: Not on file  Physical Activity: Not on file  Stress: Not on file   Social Connections: Not on file    Family History  Problem Relation Age of Onset  . Colon cancer Father   . Prostate cancer Father   . Heart disease Father   . Hypertension Father   . Colon polyps Father   . Heart disease Mother   . Stroke Mother   . Hypertension Mother   . Kidney disease Maternal Grandfather   . Diabetes Maternal Grandfather   . Sudden death Paternal Grandfather   . Sudden death Paternal Uncle        Cardiac issues  . Esophageal cancer Neg Hx   . Rectal cancer Neg Hx   . Stomach cancer Neg Hx     Review of Systems  Constitutional: Negative for fever.  Respiratory: Negative for cough, shortness of breath and wheezing.   Cardiovascular: Negative for chest pain, palpitations and leg swelling.  Neurological: Negative for light-headedness and headaches.       Objective:   Vitals:   08/27/20 0842  BP: 124/78  Pulse: 95  Temp: 98.1 F (36.7 C)  SpO2: 96%   BP Readings from Last 3 Encounters:  08/27/20 124/78  07/30/20 138/82  12/10/19 (!) 154/108   Wt Readings from Last 3 Encounters:  08/27/20 187 lb (84.8 kg)  07/30/20 189 lb (85.7 kg)  12/10/19 195 lb (88.5 kg)   Body mass index is 31.12 kg/m.   Physical Exam    Constitutional: Appears well-developed and well-nourished. No distress.  HENT:  Head: Normocephalic and atraumatic.  Neck: Neck supple. No tracheal deviation present. No thyromegaly present.  No cervical lymphadenopathy Cardiovascular: Normal rate, regular rhythm and normal heart sounds.   No murmur heard. No carotid bruit .  No edema Pulmonary/Chest: Effort normal and breath sounds normal. No respiratory distress. No has no wheezes. No rales.  Skin: Skin is warm and dry. Not diaphoretic.  Psychiatric: Normal mood and affect. Behavior is normal.      Assessment & Plan:    See Problem List for Assessment and Plan of chronic medical problems.    This visit occurred during the SARS-CoV-2 public health emergency.  Safety  protocols were in place, including screening questions prior to the visit, additional usage of staff PPE, and extensive cleaning of exam room while observing appropriate contact time as indicated for disinfecting solutions.

## 2020-08-27 ENCOUNTER — Other Ambulatory Visit: Payer: Self-pay

## 2020-08-27 ENCOUNTER — Encounter: Payer: Self-pay | Admitting: Internal Medicine

## 2020-08-27 ENCOUNTER — Ambulatory Visit: Payer: BC Managed Care – PPO | Admitting: Internal Medicine

## 2020-08-27 VITALS — BP 124/78 | HR 95 | Temp 98.1°F | Ht 65.0 in | Wt 187.0 lb

## 2020-08-27 DIAGNOSIS — E669 Obesity, unspecified: Secondary | ICD-10-CM

## 2020-08-27 DIAGNOSIS — G479 Sleep disorder, unspecified: Secondary | ICD-10-CM | POA: Diagnosis not present

## 2020-08-27 DIAGNOSIS — I10 Essential (primary) hypertension: Secondary | ICD-10-CM

## 2020-08-27 NOTE — Assessment & Plan Note (Signed)
Chronic BP well controlled Continue amlodipine 5 mg daily and diovan-hct 320-25 mg daily

## 2020-08-27 NOTE — Assessment & Plan Note (Signed)
Chronic Exercising regualrly - dance fitness - will continue Has lost some weight - will continue her efforts

## 2020-08-27 NOTE — Assessment & Plan Note (Signed)
Chronic Controlled, stable Continue trazodone 50 mg nightly

## 2020-10-13 ENCOUNTER — Other Ambulatory Visit: Payer: Self-pay

## 2020-10-13 ENCOUNTER — Encounter: Payer: Self-pay | Admitting: Cardiovascular Disease

## 2020-10-13 ENCOUNTER — Ambulatory Visit: Payer: BC Managed Care – PPO | Admitting: Cardiovascular Disease

## 2020-10-13 VITALS — BP 120/90 | HR 87 | Ht 64.0 in | Wt 195.0 lb

## 2020-10-13 DIAGNOSIS — E669 Obesity, unspecified: Secondary | ICD-10-CM

## 2020-10-13 DIAGNOSIS — I10 Essential (primary) hypertension: Secondary | ICD-10-CM | POA: Diagnosis not present

## 2020-10-13 NOTE — Progress Notes (Signed)
Hypertension Clinic Initial Assessment:    Date:  10/20/2020   ID:  Helen Young, DOB [birth year], MRN [id number]  PCP:  Binnie Rail, MD  Cardiologist:  No primary care provider on file.  Nephrologist:  Referring MD: Servando Salina, MD   CC: Hypertension  History of Present Illness:    Helen Young is a 60 y.o. female with a hx of hypertension here to establish care in the hypertension clinic.  She was first diagnosed with hypertension about 12 years.  It was initially well-controlled but she has struggled lately.  She has been under a lot of stress.  She is taking two classes and also teaching.  Sometimes when she gets stressed she gets short of breath.  She exercises using a dance video.  4 days/week for 20 to 30 minutes.  She has no exertional chest pain or shortness of breath.  She feels good with exercise.  She denies lower extremity edema, orthopnea, or PND.  She has been working on her diet as well.  She has been able to lose about 10 pounds.  When she checks her blood pressure at home she often just is done and checks it.  She notes that yesterday when she checked that she sat down and waited for a while and her blood pressure was in the 1 teens over 32s.  She wondered if it was accurate.  She saw Dr. Garwin Brothers on 09/01/20 and her BP was 147/106.  At that time she had recently been started on amlodipine.  Prior to that she was taking losartan/HCTZ.  She notes that she takes her diuretic at night.  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  She does not think she snores and she has no apnea.  She sometimes wakes up in the middle of the night which she attributes to insomnia.  She mostly cooks at home and tries to limit her sodium intake.  She very rarely has alcohol.  She drinks 1 coffee in the morning but no other caffeine throughout the day.  She very rarely uses ibuprofen.  2 weeks ago she started to develop mild ankle swelling.  She has no orthopnea or  PND.  She has several family members with heart failure, including her father, grandfather, and uncles.  Her father developed it in his 55s.  She has a younger brother who developed cardiomyopathy and sudden cardiac death.   Previous antihypertensives:   Past Medical History:  Diagnosis Date  . Allergy    seasonal, environmental  . Environmental allergies 02-27-13   OTC meds daily  . Headache(784.0) 02-27-13   occ. migraines  . HTN (hypertension)   . Transfusion history    childhood s/p Tonsillectomy    Past Surgical History:  Procedure Laterality Date  . ABDOMINAL HYSTERECTOMY    . BLADDER REPAIR    . BREAST REDUCTION SURGERY  03/16/2016  . COLONOSCOPY    . CYSTOSTOMY N/A 03/02/2013   Procedure: CYSTOSTOMY CLOSURE ;  Surgeon: Alexis Frock, MD;  Location: [facility];  Service: Urology;  Laterality: N/A;  . INCISIONAL HERNIA REPAIR N/A 03/02/2013   Procedure: LAPAROSCOPIC INCISIONAL HERNIA CONVERTED TO OPEN ;  Surgeon: Harl Bowie, MD;  Location: [facility];  Service: General;  Laterality: N/A;  . INGUINAL HERNIA REPAIR     x's 2   . KNEE SURGERY Left    torn meniscus - arthroscopic repair  . NECK SURGERY     Disc  replaced with a steel plate  . TONSILLECTOMY     childhoood  . TOTAL ABDOMINAL HYSTERECTOMY W/ BILATERAL SALPINGOOPHORECTOMY     prolapsed uterus at 12-27 years old, ovaries and tubes removed 10 years later due to cysts    Current Medications: Current Meds  Medication Sig  . amLODipine (NORVASC) 5 MG tablet TAKE 1 TABLET(5 MG) BY MOUTH DAILY  . Calcium Citrate-Vitamin D (CALCIUM CITRATE + D3) 200-250 MG-UNIT TABS Take 1 capsule by mouth daily.  . potassium gluconate 595 (99 K) MG TABS tablet Take 595 mg by mouth daily.  . traZODone (DESYREL) 50 MG tablet TAKE 1 TABLET(50 MG) BY MOUTH AT BEDTIME AS NEEDED FOR SLEEP  . valsartan-hydrochlorothiazide (DIOVAN-HCT) 320-25 MG tablet Take 1 tablet by mouth daily.  . vitamin B-12 (CYANOCOBALAMIN) 500 MCG tablet Take 500  mcg by mouth daily.     Allergies:   Fish allergy and Percocet [oxycodone-acetaminophen]   Social History   Socioeconomic History  . Marital status: Single    Spouse name: Not on file  . Number of children: 3  . Years of education: Not on file  . Highest education level: Not on file  Occupational History  . Occupation: PROGRAM Advertising account executive: [employer]   Tobacco Use  . Smoking status: Former Smoker    Quit date: 09/06/2005    Years since quitting: 15.1  . Smokeless tobacco: Never Used  Vaping Use  . Vaping Use: Never used  Substance and Sexual Activity  . Alcohol use: Yes    Alcohol/week: 0.0 standard drinks    Comment: Rarely  . Drug use: No  . Sexual activity: Yes  Other Topics Concern  . Not on file  Social History Narrative   No regular exercise right now   Social Determinants of Health   Financial Resource Strain: Not on file  Food Insecurity: Not on file  Transportation Needs: Not on file  Physical Activity: Not on file  Stress: Not on file  Social Connections: Not on file     Family History: The patient's family history includes Colon cancer in her father; Colon polyps in her father; Diabetes in her maternal grandfather; Heart disease in her father and mother; Heart failure in her cousin, father, and paternal uncle; Hypertension in her father and mother; Kidney disease in her maternal grandfather; Prostate cancer in her father; Stroke in her mother; Sudden death in her paternal grandfather and paternal uncle. There is no history of Esophageal cancer, Rectal cancer, or Stomach cancer.  ROS:   Please see the history of present illness.     All other systems reviewed and are negative.  EKGs/Labs/Other Studies Reviewed:    EKG:  EKG is ordered today.  The ekg ordered today demonstrates sinus rhythm.  Rate 87 bpm.    Recent Labs: 07/30/2020: ALT 12; BUN 10; Creatinine, Ser 0.79; Potassium 4.2; Sodium 140 10/16/2020: TSH 1.020   Recent Lipid Panel     Component Value Date/Time   CHOL 208 (H) 03/06/2019 1530   TRIG 250.0 (H) 03/06/2019 1530   HDL 55.50 03/06/2019 1530   CHOLHDL 4 03/06/2019 1530   VLDL 50.0 (H) 03/06/2019 1530   LDLCALC 129 (H) 12/01/2016 1051   LDLDIRECT 118.0 03/06/2019 1530    Physical Exam:    VS:  BP 120/90 (BP Location: Right Arm, Patient Position: Sitting, Cuff Size: Normal)   Pulse 87   Ht 5\' 4"  (1.626 m)   Wt 195 lb (88.5 kg)   SpO2  97%   BMI 33.47 kg/m  , BMI Body mass index is 33.47 kg/m. GENERAL:  Well appearing HEENT: Pupils equal round and reactive, fundi not visualized, oral mucosa unremarkable NECK:  No jugular venous distention, waveform within normal limits, carotid upstroke brisk and symmetric, no bruits LUNGS:  Clear to auscultation bilaterally HEART:  RRR.  PMI not displaced or sustained,S1 and S2 within normal limits, no S3, no S4, no clicks, no rubs, no murmurs ABD:  Flat, positive bowel sounds normal in frequency in pitch, no bruits, no rebound, no guarding, no midline pulsatile mass, no hepatomegaly, no splenomegaly EXT:  2 plus pulses throughout, no edema, no cyanosis no clubbing SKIN:  No rashes no nodules NEURO:  Cranial nerves II through XII grossly intact, motor grossly intact throughout PSYCH:  Cognitively intact, oriented to person place and time   ASSESSMENT:    1. Essential hypertension   2. Primary hypertension   3. Obesity (BMI 30-39.9)     PLAN:    # Essential hypertension:  # Family history of HF:  Blood pressure is improving but not at her goal of less than 130/80.  She does have some ankle swelling, which I suspect is related to the amlodipine.  She is not bothered by it and we will watch it for now.  Consider an echo at the next appointment, especially given her family history of cardiomyopathy.  We will check renal artery Dopplers and a TSH.  We will also have her talk with our care guide regarding stress management.  She was given a book on hypertension and  limiting sodium.  She will work on incorporating the [employer].  She was also referred to the PR EP program through the [facility] for exercise.  She is unsure if she snores.  She is not always well rested but notes that her sleep is somewhat broken.  Consider sleep study in the future.   Secondary Causes of Hypertension  Medications/Herbal: OCP, steroids, stimulants, antidepressants, weight loss medication, immune suppressants, NSAIDs, sympathomimetics, alcohol, caffeine, licorice, ginseng, St. John's wort, chemo  Sleep Apnea: Consider testing in the future Renal artery stenosis: Check renal artery Dopplers Hyperaldosteronism: (testing not indicated)  Hyper/hypothyroidism: Check TSH Pheochromocytoma: (testing not indicated)  Cushing's syndrome:(testing not indicated)  Coarctation of the aorta: BP symmetric   Disposition:    FU with MD/PharmD in 1 month    Medication Adjustments/Labs and Tests Ordered: Current medicines are reviewed at length with the patient today.  Concerns regarding medicines are outlined above.  Orders Placed This Encounter  Procedures  . TSH  . EKG 12-Lead  . VAS US RENAL ARTERY DUPLEX   No orders of the defined types were placed in this encounter.    Signed, Skeet Latch, MD  10/20/2020 1:06 PM    [facility]

## 2020-10-13 NOTE — Patient Instructions (Addendum)
Medication Instructions:  Your physician recommends that you continue on your current medications as directed. Please refer to the Current Medication list given to you today.   Labwork: TSH SOON    Testing/Procedures: Your physician has requested that you have a renal artery duplex. During this test, an ultrasound is used to evaluate blood flow to the kidneys. Allow one hour for this exam. Do not eat after midnight the day before and avoid carbonated beverages. Take your medications as you usually do.  Follow-Up: 01/14/2021 AT 3:30 WITH DR Worthington will receive a phone call from the [facility] program to schedule an initial assessment.   Special Instructions:   MONITOR YOUR BLOOD DAILY,  LOG IN THE BOOK PROVIDED. BRING THE BOOK AND YOUR BLOOD PRESSURE MACHINE TO YOUR FOLLOW UP IN 3 MONTHS   [facility] WILL CALL YOU REGARDING STRESS MANAGEMENT   WORK ON DIET AND EXERCISE   DASH Eating Plan DASH stands for "Dietary Approaches to Stop Hypertension." The DASH eating plan is a healthy eating plan that has been shown to reduce high blood pressure (hypertension). It may also reduce your risk for type 2 diabetes, heart disease, and stroke. The DASH eating plan may also help with weight loss. What are tips for following this plan?  General guidelines  Avoid eating more than 2,300 mg (milligrams) of salt (sodium) a day. If you have hypertension, you may need to reduce your sodium intake to 1,500 mg a day.  Limit alcohol intake to no more than 1 drink a day for nonpregnant women and 2 drinks a day for men. One drink equals 12 oz of beer, 5 oz of wine, or 1 oz of hard liquor.  Work with your health care provider to maintain a healthy body weight or to lose weight. Ask what an ideal weight is for you.  Get at least 30 minutes of exercise that causes your heart to beat faster (aerobic exercise) most days of the week. Activities may include walking, swimming, or biking.  Work  with your health care provider or diet and nutrition specialist (dietitian) to adjust your eating plan to your individual calorie needs. Reading food labels   Check food labels for the amount of sodium per serving. Choose foods with less than 5 percent of the Daily Value of sodium. Generally, foods with less than 300 mg of sodium per serving fit into this eating plan.  To find whole grains, look for the word "whole" as the first word in the ingredient list. Shopping  Buy products labeled as "low-sodium" or "no salt added."  Buy fresh foods. Avoid canned foods and premade or frozen meals. Cooking  Avoid adding salt when cooking. Use salt-free seasonings or herbs instead of table salt or sea salt. Check with your health care provider or pharmacist before using salt substitutes.  Do not fry foods. Cook foods using healthy methods such as baking, boiling, grilling, and broiling instead.  Cook with heart-healthy oils, such as olive, canola, soybean, or sunflower oil. Meal planning  Eat a balanced diet that includes: ? 5 or more servings of fruits and vegetables each day. At each meal, try to fill half of your plate with fruits and vegetables. ? Up to 6-8 servings of whole grains each day. ? Less than 6 oz of lean meat, poultry, or fish each day. A 3-oz serving of meat is about the same size as a deck of cards. One egg equals 1 oz. ?  2 servings of low-fat dairy each day. ? A serving of nuts, seeds, or beans 5 times each week. ? Heart-healthy fats. Healthy fats called Omega-3 fatty acids are found in foods such as flaxseeds and coldwater fish, like sardines, salmon, and mackerel.  Limit how much you eat of the following: ? Canned or prepackaged foods. ? Food that is high in trans fat, such as fried foods. ? Food that is high in saturated fat, such as fatty meat. ? Sweets, desserts, sugary drinks, and other foods with added sugar. ? Full-fat dairy products.  Do not salt foods before  eating.  Try to eat at least 2 vegetarian meals each week.  Eat more home-cooked food and less restaurant, buffet, and fast food.  When eating at a restaurant, ask that your food be prepared with less salt or no salt, if possible. What foods are recommended? The items listed may not be a complete list. Talk with your dietitian about what dietary choices are best for you. Grains Whole-grain or whole-wheat bread. Whole-grain or whole-wheat pasta. Brown rice. [place]. Bulgur. Whole-grain and low-sodium cereals. Pita bread. Low-fat, low-sodium crackers. Whole-wheat flour tortillas. Vegetables Fresh or frozen vegetables (raw, steamed, roasted, or grilled). Low-sodium or reduced-sodium tomato and vegetable juice. Low-sodium or reduced-sodium tomato sauce and tomato paste. Low-sodium or reduced-sodium canned vegetables. Fruits All fresh, dried, or frozen fruit. Canned fruit in natural juice (without added sugar). Meat and other protein foods Skinless chicken or [place]. Ground chicken or [place]. Pork with fat trimmed off. Fish and seafood. Egg whites. Dried beans, peas, or lentils. Unsalted nuts, nut butters, and seeds. Unsalted canned beans. Lean cuts of beef with fat trimmed off. Low-sodium, lean deli meat. Dairy Low-fat (1%) or fat-free (skim) milk. Fat-free, low-fat, or reduced-fat cheeses. Nonfat, low-sodium ricotta or cottage cheese. Low-fat or nonfat yogurt. Low-fat, low-sodium cheese. Fats and oils Soft margarine without trans fats. Vegetable oil. Low-fat, reduced-fat, or light mayonnaise and salad dressings (reduced-sodium). Canola, safflower, olive, soybean, and sunflower oils. Avocado. Seasoning and other foods Herbs. Spices. Seasoning mixes without salt. Unsalted popcorn and pretzels. Fat-free sweets. What foods are not recommended? The items listed may not be a complete list. Talk with your dietitian about what dietary choices are best for you. Grains Baked goods made with fat,  such as croissants, muffins, or some breads. Dry pasta or rice meal packs. Vegetables Creamed or fried vegetables. Vegetables in a cheese sauce. Regular canned vegetables (not low-sodium or reduced-sodium). Regular canned tomato sauce and paste (not low-sodium or reduced-sodium). Regular tomato and vegetable juice (not low-sodium or reduced-sodium). Angie Fava. Olives. Fruits Canned fruit in a light or heavy syrup. Fried fruit. Fruit in cream or butter sauce. Meat and other protein foods Fatty cuts of meat. Ribs. Fried meat. Berniece Salines. Sausage. Bologna and other processed lunch meats. Salami. Fatback. Hotdogs. Bratwurst. Salted nuts and seeds. Canned beans with added salt. Canned or smoked fish. Whole eggs or egg yolks. Chicken or [place] with skin. Dairy Whole or 2% milk, cream, and half-and-half. Whole or full-fat cream cheese. Whole-fat or sweetened yogurt. Full-fat cheese. Nondairy creamers. Whipped toppings. Processed cheese and cheese spreads. Fats and oils Butter. Stick margarine. Lard. Shortening. Ghee. Bacon fat. Tropical oils, such as coconut, palm kernel, or palm oil. Seasoning and other foods Salted popcorn and pretzels. Onion salt, garlic salt, seasoned salt, table salt, and sea salt. Worcestershire sauce. Tartar sauce. Barbecue sauce. Teriyaki sauce. Soy sauce, including reduced-sodium. Steak sauce. Canned and packaged gravies. Fish sauce. Oyster sauce. Cocktail sauce.  Horseradish that you find on the shelf. Ketchup. Mustard. Meat flavorings and tenderizers. Bouillon cubes. Hot sauce and Tabasco sauce. Premade or packaged marinades. Premade or packaged taco seasonings. Relishes. Regular salad dressings. Where to find more information:  National Heart, Lung, and [place]: https://wilson-eaton.com/  American Heart Association: www.heart.org Summary  The DASH eating plan is a healthy eating plan that has been shown to reduce high blood pressure (hypertension). It may also reduce your risk for  type 2 diabetes, heart disease, and stroke.  With the DASH eating plan, you should limit salt (sodium) intake to 2,300 mg a day. If you have hypertension, you may need to reduce your sodium intake to 1,500 mg a day.  When on the DASH eating plan, aim to eat more fresh fruits and vegetables, whole grains, lean proteins, low-fat dairy, and heart-healthy fats.  Work with your health care provider or diet and nutrition specialist (dietitian) to adjust your eating plan to your individual calorie needs. This information is not intended to replace advice given to you by your health care provider. Make sure you discuss any questions you have with your health care provider. Document Released: 08/12/2011 Document Revised: 08/05/2017 Document Reviewed: 08/16/2016 Elsevier Patient Education  2020 [employer].

## 2020-10-15 ENCOUNTER — Telehealth: Payer: Self-pay

## 2020-10-15 DIAGNOSIS — Z0000 Encounter for general adult medical examination without abnormal findings: Secondary | ICD-10-CM

## 2020-10-15 NOTE — Telephone Encounter (Signed)
Called patient to discuss health coaching for stress management. Left patient a message to call Care Guide back at [phone].

## 2020-10-17 ENCOUNTER — Telehealth: Payer: Self-pay

## 2020-10-17 LAB — TSH: TSH: 1.02 u[IU]/mL (ref 0.450–4.500)

## 2020-10-17 NOTE — Telephone Encounter (Signed)
Call to pt reference referral to [facility] program and times available Needs a M/W everning class preferably at [facility]. 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  Will keep her on my list and recall her when a better class time is available.

## 2020-10-20 ENCOUNTER — Encounter: Payer: Self-pay | Admitting: Cardiovascular Disease

## 2020-10-27 ENCOUNTER — Inpatient Hospital Stay (HOSPITAL_COMMUNITY): Admission: RE | Admit: 2020-10-27 | Payer: BC Managed Care – PPO | Source: Ambulatory Visit

## 2020-11-03 ENCOUNTER — Ambulatory Visit (HOSPITAL_COMMUNITY)
Admission: RE | Admit: 2020-11-03 | Discharge: 2020-11-03 | Disposition: A | Discharge status: Home / Self Care | Payer: BC Managed Care – PPO | Source: Ambulatory Visit | Attending: Internal Medicine | Admitting: Internal Medicine

## 2020-11-03 ENCOUNTER — Other Ambulatory Visit: Payer: Self-pay

## 2020-11-03 DIAGNOSIS — I10 Essential (primary) hypertension: Secondary | ICD-10-CM | POA: Diagnosis present

## 2020-11-20 ENCOUNTER — Telehealth: Payer: Self-pay

## 2020-11-20 DIAGNOSIS — Z0000 Encounter for general adult medical examination without abnormal findings: Secondary | ICD-10-CM

## 2020-11-20 NOTE — Telephone Encounter (Signed)
Called patient to discuss health coaching for stress management per Dr. Oval Linsey. Patient is interested in health coaching and has been scheduled for 11/26/20 at 3:15pm for the initial in-person session.

## 2020-11-26 ENCOUNTER — Other Ambulatory Visit: Payer: Self-pay

## 2020-11-26 ENCOUNTER — Ambulatory Visit (INDEPENDENT_AMBULATORY_CARE_PROVIDER_SITE_OTHER): Payer: BC Managed Care – PPO

## 2020-11-26 DIAGNOSIS — Z0000 Encounter for general adult medical examination without abnormal findings: Secondary | ICD-10-CM

## 2020-11-26 NOTE — Patient Instructions (Signed)
Managing Stress, Adult Feeling a certain amount of stress is normal. Stress helps our body and mind get ready to deal with the demands of life. Stress hormones can motivate you to do well at work and meet your responsibilities. However severe or long-lasting (chronic) stress can affect your mental and physical health. Chronic stress puts you at higher risk for anxiety, depression, and other health problems like digestive problems, muscle aches, heart disease, high blood pressure, and stroke. What are the causes? Common causes of stress include:  Demands from work, such as deadlines, feeling overworked, or having long hours.  Pressures at home, such as money issues, disagreements with a spouse, or parenting issues.  Pressures from major life changes, such as divorce, moving, loss of a loved one, or chronic illness. You may be at higher risk for stress-related problems if you do not get enough sleep, are in poor health, do not have emotional support, or have a mental health disorder like anxiety or depression. How to recognize stress Stress can make you:  Have trouble sleeping.  Feel sad, anxious, irritable, or overwhelmed.  Lose your appetite.  Overeat or want to eat unhealthy foods.  Want to use drugs or alcohol. Stress can also cause physical symptoms, such as:  Sore, tense muscles, especially in the shoulders and neck.  Headaches.  Trouble breathing.  A faster heart rate.  Stomach pain, nausea, or vomiting.  Diarrhea or constipation.  Trouble concentrating. Follow these instructions at home: Lifestyle  Identify the source of your stress and your reaction to it. See a therapist who can help you change your reactions.  When there are stressful events: ? Talk about it with family, friends, or co-workers. ? Try to think realistically about stressful events and not ignore them or overreact. ? Try to find the positives in a stressful situation and not focus on the  negatives. ? Cut back on responsibilities at work and home, if possible. Ask for help from friends or family members if you need it.  Find ways to cope with stress, such as: ? Meditation. ? Deep breathing. ? Yoga or tai chi. ? Progressive muscle relaxation. ? Doing art, playing music, or reading. ? Making time for fun activities. ? Spending time with family and friends.  Get support from family, friends, or spiritual resources. Eating and drinking  Eat a healthy diet. This includes: ? Eating foods that are high in fiber, such as beans, whole grains, and fresh fruits and vegetables. ? Limiting foods that are high in fat and processed sugars, such as fried and sweet foods.  Do not skip meals or overeat.  Drink enough fluid to keep your urine pale yellow. Alcohol use  Do not drink alcohol if: ? Your health care provider tells you not to drink. ? You are pregnant, may be pregnant, or are planning to become pregnant.  Drinking alcohol is a way some people try to ease their stress. This can be dangerous, so if you drink alcohol: ? Limit how much you use to:  0-1 drink a day for women.  0-2 drinks a day for men. ? Be aware of how much alcohol is in your drink. In the [place] one drink equals one 12 oz bottle of beer (355 mL), one 5 oz glass of wine (148 mL), or one 1 oz glass of hard liquor (44 mL). Activity  Include 30 minutes of exercise in your daily schedule. Exercise is a good stress reducer.  Include time in your day for   an activity that you find relaxing. Try taking a walk, going on a bike ride, reading a book, or listening to music.  Schedule your time in a way that lowers stress, and keep a consistent schedule. Prioritize what is most important to get done.   General instructions  Get enough sleep. Try to go to sleep and get up at about the same time every day.  Take over-the-counter and prescription medicines only as told by your health care provider.  Do not use any  products that contain nicotine or tobacco, such as cigarettes, e-cigarettes, and chewing tobacco. If you need help quitting, ask your health care provider.  Do not use drugs or smoke to cope with stress.  Keep all follow-up visits as told by your health care provider. This is important. Where to find support  Talk with your health care provider about stress management or finding a support group.  Find a therapist to work with you on your stress management techniques. Contact a health care provider if:  Your stress symptoms get worse.  You are unable to manage your stress at home.  You are struggling to stop using drugs or alcohol. Get help right away if:  You may be a danger to yourself or others.  You have any thoughts of death or suicide. If you ever feel like you may hurt yourself or others, or have thoughts about taking your own life, get help right away. You can go to your nearest emergency department or call:  Your local emergency services (911 in the [place]  A suicide crisis helpline, such as the National Suicide Prevention Lifeline at 1-800-273-8255. This is open 24 hours a day. Summary  Feeling a certain amount of stress is normal, but severe or long-lasting (chronic) stress can affect your mental and physical health.  Chronic stress can put you at higher risk for anxiety, depression, and other health problems like digestive problems, muscle aches, heart disease, high blood pressure, and stroke.  You may be at higher risk for stress-related problems if you do not get enough sleep, are in poor health, lack emotional support, or have a mental health disorder like anxiety or depression.  Identify the source of your stress and your reaction to it. Try talking about stressful events with family, friends, or co-workers, finding a coping method, or getting support from spiritual resources.  If you need more help, talk with your health care provider about finding a support group  or a mental health therapist. This information is not intended to replace advice given to you by your health care provider. Make sure you discuss any questions you have with your health care provider. Document Revised: 03/21/2019 Document Reviewed: 03/21/2019 Elsevier Patient Education  2021 Elsevier Inc.  

## 2020-11-26 NOTE — Progress Notes (Signed)
Appointment Outcome:  Completed, Session #: Initial health coaching session  AGREEMENTS SECTION   Overall Goal(s): Stress management                                                Agreement/Action Steps:  Writing in stress journal for 30 minutes at 8:00pm Write down daily schedule to prioritize assignments/tasks (e.g., checking emails) Walk 3 miles on Wed @ 6pm, Sat & Sun in the mornings    Progress Notes:  Patient stated that she is stressed by work and school. Patient is a full-time [employer] copy. Both roles contribute to her high stress levels. Patient is currently taking two courses for accreditation purposes. Patient shared that she is a stress eater and do not eat healthy during this time. She has attempted to reduce stress related to school by not looking at emails from several sources, from professors. However, when she does look at them, she is overwhelmed of the consequence because she will find out that due dates have changed, and she only have 2-3 days to submit an assignment or another assignment has been added to her workload with the courses already being writing intensive. She also stated that when she does not respond to work emails, she will receive text messages.   Patient is also concerned and often overwhelmed regarding her daughter's medical condition. Patient is processing her feelings and the situation through prayer, not thinking about it, and knowing when to step away. However, she is aware that this situation has some type of effect on her mentally and physically.   Patient stated that she walks 3 miles sometimes to clear her head but haven't been to the gym since Covid. However, she does have a home Latin dance video she follows at times. Patient stated that she prefers to be out in nature. She also gardens and plant flowers. Patient plans to garden and plant flowers this year.   Patient stated that she needs to get organized, so she doesn't get stressed. She  stated that she is more of a morning person and prefers to get her work done then. Patient shared that she shuts down mentally around 3:00 -4:00 pm.   Coaching Outcomes: Patient is interested in getting things off her mind before going to bed at night. She stressed that walking helps to clear her mind and decompress but needs to be held accountable so she can be consistent. Patient wants to work towards being organized to reduce the mind clutter from having to remember and balance all her responsibilities.   Patient's initial action steps to aid in stress management is as follows:  Agreement/Action Steps:  Writing in stress journal for 30 minutes at 8:00pm Write down daily schedule to prioritize assignments/tasks (e.g., checking emails) Walk 3 miles on Wed @ 6pm, Sat & Sun in the mornings  Patient has received a signed copy of the health coaching agreement and the Code of Ethics. Patient was provided educational material on stress management and instructions on how to write in a stress journal.

## 2020-12-04 NOTE — Progress Notes (Signed)
Subjective:    Patient ID: Helen Young, female    DOB: Aug 30, 1961, 60 y.o.   MRN: [id number]  HPI The patient is here for an acute visit.  Started about three weeks ago - pain in the base of her neck and right scapula pain.  She feels pain and pressure at the base of the spine after moving around a lot.  It is worse at the end of the day.  The pain radiates down her spine and into her right scapula/shoulder.  He denies any pain with movements.   She has h/o of right rotator cuff surgery year ago  Never had surgery - not sure if that is related.    She denies any pain into the arms or numbness tingling in the arms.  She denies hand weakness.  She also occasionally feels off balance  If standing for long periods of time - she sits.      Medications and allergies reviewed with patient and updated if appropriate.  Patient Active Problem List   Diagnosis Date Noted  . Urinary urgency 07/29/2019  . LLQ pain 03/21/2019  . Diverticulosis 03/21/2019  . Fatty liver 03/21/2019  . 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 Lower back pain 08/23/2018  . Right rotator cuff tear 05/19/2017  . Vitamin D deficiency 12/01/2016  . Sleep difficulties 08/11/2016  . Numbness in left leg 07/16/2015  . Back pain 12/06/2014  . Obesity (BMI 30-39.9) 10/15/2013  . Recurrent ventral incisional hernia 02/09/2013  . Thoracic back pain 10/09/2012  . HTN (hypertension) 04/06/2012  . Scoliosis 04/06/2012  . Environmental allergies 04/06/2012    Current Outpatient Medications on File Prior to Visit  Medication Sig Dispense Refill  . amLODipine (NORVASC) 5 MG tablet TAKE 1 TABLET(5 MG) BY MOUTH DAILY 90 tablet 1  . Calcium Citrate-Vitamin D (CALCIUM CITRATE + D3) 200-250 MG-UNIT TABS Take 1 capsule by mouth daily.    . potassium gluconate 595 (99 K) MG TABS tablet Take 595 mg by mouth daily.    . traZODone (DESYREL) 50 MG tablet TAKE 1 TABLET(50 MG) BY MOUTH AT BEDTIME AS NEEDED FOR SLEEP 90 tablet 1  .  valsartan-hydrochlorothiazide (DIOVAN-HCT) 320-25 MG tablet Take 1 tablet by mouth daily. 30 tablet 5  . vitamin B-12 (CYANOCOBALAMIN) 500 MCG tablet Take 500 mcg by mouth daily.     No current facility-administered medications on file prior to visit.    Past Medical History:  Diagnosis Date  . Allergy    seasonal, environmental  . Environmental allergies 02-27-13   OTC meds daily  . Headache(784.0) 02-27-13   occ. migraines  . HTN (hypertension)   . Transfusion history    childhood s/p Tonsillectomy    Past Surgical History:  Procedure Laterality Date  . ABDOMINAL HYSTERECTOMY    . BLADDER REPAIR    . BREAST REDUCTION SURGERY  03/16/2016  . COLONOSCOPY    . CYSTOSTOMY N/A 03/02/2013   Procedure: CYSTOSTOMY CLOSURE ;  Surgeon: Alexis Frock, MD;  Location: [facility];  Service: Urology;  Laterality: N/A;  . INCISIONAL HERNIA REPAIR N/A 03/02/2013   Procedure: LAPAROSCOPIC INCISIONAL HERNIA CONVERTED TO OPEN ;  Surgeon: Harl Bowie, MD;  Location: [facility];  Service: General;  Laterality: N/A;  . INGUINAL HERNIA REPAIR     x's 2   . KNEE SURGERY Left    torn meniscus - arthroscopic repair  . NECK SURGERY     Disc replaced with a steel plate  . TONSILLECTOMY  childhoood  . TOTAL ABDOMINAL HYSTERECTOMY W/ BILATERAL SALPINGOOPHORECTOMY     prolapsed uterus at 17-22 years old, ovaries and tubes removed 10 years later due to cysts    Social History   Socioeconomic History  . Marital status: Single    Spouse name: Not on file  . Number of children: 3  . Years of education: Not on file  . Highest education level: Not on file  Occupational History  . Occupation: PROGRAM Advertising account executive: [employer]  Tobacco Use  . Smoking status: Former Smoker    Quit date: 09/06/2005    Years since quitting: 15.2  . Smokeless tobacco: Never Used  Vaping Use  . Vaping Use: Never used  Substance and Sexual Activity  . Alcohol use: Yes    Alcohol/week: 0.0 standard drinks     Comment: Rarely  . Drug use: No  . Sexual activity: Yes  Other Topics Concern  . Not on file  Social History Narrative   No regular exercise right now   Social Determinants of Health   Financial Resource Strain: Not on file  Food Insecurity: Not on file  Transportation Needs: Not on file  Physical Activity: Not on file  Stress: Not on file  Social Connections: Not on file    Family History  Problem Relation Age of Onset  . Colon cancer Father   . Prostate cancer Father   . Heart disease Father   . Hypertension Father   . Colon polyps Father   . Heart failure Father   . Heart disease Mother   . Stroke Mother   . Hypertension Mother   . Kidney disease Maternal Grandfather   . Diabetes Maternal Grandfather   . Sudden death Paternal Grandfather   . Sudden death Paternal Uncle        Cardiac issues  . Heart failure Paternal Uncle   . Heart failure Cousin   . Esophageal cancer Neg Hx   . Rectal cancer Neg Hx   . Stomach cancer Neg Hx     Review of Systems  Constitutional: Negative for fever.  Musculoskeletal: Positive for neck pain.       Muscle tightness/ spasms - right upper back  Neurological: Negative for light-headedness, numbness and headaches.       Objective:   Vitals:   12/05/20 1523  BP: 130/88  Pulse: 85  Temp: 98.4 F (36.9 C)  SpO2: 98%   BP Readings from Last 3 Encounters:  12/05/20 130/88  10/13/20 120/90  08/27/20 124/78   Wt Readings from Last 3 Encounters:  12/05/20 194 lb 6.4 oz (88.2 kg)  10/13/20 195 lb (88.5 kg)  08/27/20 187 lb (84.8 kg)   Body mass index is 33.37 kg/m.   Physical Exam Constitutional:      General: She is not in acute distress.    Appearance: Normal appearance. She is not ill-appearing.  HENT:     Head: Normocephalic and atraumatic.  Musculoskeletal:     Comments: Mild tenderness with palpation base of cervical spine, mild tenderness right scapula.  No trapezius muscle tenderness bilaterally.  Full range  of motion of neck and right shoulder  Neurological:     Mental Status: She is alert.     Sensory: No sensory deficit.     Motor: No weakness.           Assessment & Plan:    See Problem List for Assessment and Plan of chronic medical problems.  This visit occurred during the SARS-CoV-2 public health emergency.  Safety protocols were in place, including screening questions prior to the visit, additional usage of staff PPE, and extensive cleaning of exam room while observing appropriate contact time as indicated for disinfecting solutions.

## 2020-12-05 ENCOUNTER — Other Ambulatory Visit: Payer: Self-pay

## 2020-12-05 ENCOUNTER — Encounter: Payer: Self-pay | Admitting: Internal Medicine

## 2020-12-05 ENCOUNTER — Ambulatory Visit: Payer: BC Managed Care – PPO | Admitting: Internal Medicine

## 2020-12-05 ENCOUNTER — Ambulatory Visit (INDEPENDENT_AMBULATORY_CARE_PROVIDER_SITE_OTHER): Payer: BC Managed Care – PPO

## 2020-12-05 VITALS — BP 130/88 | HR 85 | Temp 98.4°F | Ht 64.0 in | Wt 194.4 lb

## 2020-12-05 DIAGNOSIS — M542 Cervicalgia: Secondary | ICD-10-CM

## 2020-12-05 MED ORDER — MELOXICAM 15 MG PO TABS: 15.0000 mg | ORAL_TABLET | Freq: Every day | ORAL | 0 refills | Status: DC

## 2020-12-05 MED ORDER — TIZANIDINE HCL 2 MG PO TABS: 2.0000 mg | ORAL_TABLET | Freq: Every day | ORAL | 0 refills | Status: DC

## 2020-12-05 NOTE — Patient Instructions (Addendum)
  An x-ray was ordered - have this down downstairs.       Medications changes include :   Start tizanidine 2 mg at bedtime.  Take a daily anti-inflammatory daily ( meloxicam 15 mg daily).  Your prescription(s) have been submitted to your pharmacy. Please take as directed and contact our office if you believe you are having problem(s) with the medication(s).   Please call if there is no improvement in your symptoms.

## 2020-12-06 DIAGNOSIS — M542 Cervicalgia: Secondary | ICD-10-CM | POA: Insufficient documentation

## 2020-12-06 NOTE — Assessment & Plan Note (Signed)
Acute Started 3 weeks ago No injury or accident Likely muscular in nature X-ray of cervical spine today Tizanidine 2 mg at bedtime-can increase if tolerated Meloxicam 15 mg daily with food for 2 weeks Discussed side effects of the above medications Can continue topical muscle medications, heat or ice Call if no improvement

## 2020-12-10 ENCOUNTER — Other Ambulatory Visit: Payer: Self-pay

## 2020-12-10 ENCOUNTER — Ambulatory Visit (INDEPENDENT_AMBULATORY_CARE_PROVIDER_SITE_OTHER): Payer: BC Managed Care – PPO

## 2020-12-10 DIAGNOSIS — Z0000 Encounter for general adult medical examination without abnormal findings: Secondary | ICD-10-CM

## 2020-12-10 NOTE — Progress Notes (Signed)
Appointment Outcome:  Completed, Session #: 1  AGREEMENTS SECTION   Overall Goal(s): Stress management                                                 Agreement/Action Steps:  Writing in stress journal for 30 minutes at 8:00pm Write down daily schedule to prioritize assignments/tasks (e.g., checking emails) Walk 3 miles on Wed @ 6pm, Sat & Sun in the mornings  Progress Notes:  Patient stated that she has been walking as scheduled, except for this past Wednesday due to the weather. 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 Patient has noticed that walking is helping with not being as stressed and it helps with lowering her blood pressure. She has also seen an improvement in her sleep as well.   Patient has started writing in her journal, but not every day around 8pm. A boost in her mood has occurred because she is able to focus on things that went well and that are positive. Patient analyzed her daily schedule and is working on getting her schedule engrained as a routine to reduce the stress she has been experiencing. Patient now checks emails from her classes on Tues. From 3-3:30pm. She has found herself using sometime during the day when kids are working to get ahead of schedule of creating lesson plans. Now she has set aside a particular day that she focuses on assignments to get things done ahead of schedule, so she doesn't have to rush and cause herself stress. Patient shared that to help manage stress, she takes breaks from the computer to help her regroup.   By implementing these steps, the patient has begun to analyze other sources of her stress. Patient has enforced a boundary for herself to do what is required of her and not to take on another person's responsibilities as her own. She recognizes that some things are out of her control and must let go of those things and do what she can and believes that everything else will fall in  place.  Patient stated that she is proud of herself for the progress that she has made with implementing the steps and the results she's seen in the reduction of her stress and improvement in blood pressure.  . Indicators of Success and Accountability:  Patient stated that being able to submit schoolwork on time and not being stressed out is her indicator of success.   . Readiness: Patient is in the action phase of stress management.   . Strengths and Supports: Patient stated that she is committed/motivated and held herself accountable. Patient's daughter has been a support in the past two weeks.  . Challenges and Barriers: Patient may be challenged with schedule changes.   Coaching Outcomes: Patient will continue to implement action steps as outlined above with no changes over the next two weeks.   Attempted: Marland Kitchen Fulfilled - Patient was able to implement her walks and prioritizing assignments/tasks to reduce stress.  . Partial - Patient has started writing in her journal but not on a daily basis but is working on her schedule to do so.

## 2020-12-24 ENCOUNTER — Telehealth: Payer: Self-pay

## 2020-12-24 ENCOUNTER — Ambulatory Visit (INDEPENDENT_AMBULATORY_CARE_PROVIDER_SITE_OTHER): Payer: BC Managed Care – PPO

## 2020-12-24 ENCOUNTER — Other Ambulatory Visit: Payer: Self-pay

## 2020-12-24 DIAGNOSIS — Z0000 Encounter for general adult medical examination without abnormal findings: Secondary | ICD-10-CM

## 2020-12-24 NOTE — Telephone Encounter (Signed)
Called patient to hold health coaching session over the phone as scheduled for today, 12/24/20 at 9:30am. Left patient a message to return call to Care Guide at [phone] to hold session at another time today or to reschedule.

## 2020-12-24 NOTE — Progress Notes (Signed)
Appointment Outcome:  Completed, Session #: 2  AGREEMENTS SECTION  Overall Goal(s): Stress management                                                 Agreement/Action Steps:  Writing in stress journal for 30 minutes at 8:00pm Write down daily schedule to prioritize assignments/tasks (e.g., checking emails) Walk 3 miles on Wed @ 6pm, Sat & Sun in the mornings  Progress Notes:  Patient stated that she has been able to walk 3 miles as scheduled during the first week. Patient was not able to walk during this past weekend because she had her grandchildren. Patient was able to walk on Wednesday at 6pm. Patient stated that she plans to walk this Thursday and Friday morning to make up for the weekend in addition to her regular walking schedule.  Patient has been prioritizing assignments and emails via a schedule. Patient stated that she checks her email everyday around 4pm to identify the importance of each email and determine when a response is needed or to keep track of changes with her school assignments. Patient stated that her last class ended yesterday and was able to submit her final projects ahead of time, which caused her not to be stressed. Patient stated that work emails have slowed down as well and that has been helpful.   Patient continues to write in her journal as scheduled. Patient stated that even when she had her grandchildren, she was able to write in her journal after they went to bed. Patient is writing about what went well, thoughts about how things went for the day. Patient also reflects on if something stressed her out, if it was worth being stressed over, and how things could have been done differently.  . Indicators of Success and Accountability:  Patient stated that being able to prioritize and complete assignments to submit on time was her indicator of success and accountability. . Readiness: Patient is in the action phase of stress management. . Strengths and Supports:  Patient's daughter is her support. Patient's strengths are being knowledgeable, reflective, and being confident.  . Challenges and Barriers: Patient was challenged with walking during the time she had her grandchildren.  Coaching Outcomes: Patient wants to continue with action steps as they are outlined for the next two weeks.   Patient's takeaway from today's session is that she is more than what she thought she was and can do more than thought. Patient stated that she is putting herself first more than focusing on others. Patient stated that she can say no to things that will add stress and is okay with it, because previously she felt obligated to take on everything. Patient stated that she will continue to reflect where she needs to make changes. Attempted: Marland Kitchen Fulfilled - Patient was able to write in her journal as scheduled and was able to maintain a schedule to prioritize assignments and emails.  . Partial - Patient was able to walk as scheduled during the first week but had a challenge to incorporate walking during the second week as schedule.

## 2021-01-07 ENCOUNTER — Other Ambulatory Visit: Payer: Self-pay

## 2021-01-07 ENCOUNTER — Ambulatory Visit (INDEPENDENT_AMBULATORY_CARE_PROVIDER_SITE_OTHER): Payer: BC Managed Care – PPO

## 2021-01-07 DIAGNOSIS — Z0000 Encounter for general adult medical examination without abnormal findings: Secondary | ICD-10-CM

## 2021-01-07 NOTE — Progress Notes (Signed)
Appointment Outcome: Completed, Session #: 3 Start time: 3:30pm   End time: 4:06pm   Total Mins: 36 minutes  AGREEMENTS SECTION   Overall Goal(s): Stress management                                                 Agreement/Action Steps:  Writing in stress journal for 30 minutes at 8:00pm Write down daily schedule to prioritize assignments/tasks (e.g., checking emails) Walk 3 miles on Wed @ 6pm, Sat & Sun in the mornings  Progress Notes:  Patient stated that everything has been going well. Patient stated that she has walked as scheduled for the entire distance, except for this past Saturday. Patient shared that she had a lot going on with trying to get some final assignments submitted that her professor was not able to find, which was the reason why she did not have the time to walk on Saturday, which frustrated her. Patient continues to follow a schedule to help manage assignments/tasks/emails so that things are completed in a timely manner and not add to her stress.   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 Patient stated that walking at work helps her alleviate stress by the time she gets home if she's had a stressful day. Patient has also started planting flowers last week, which adds to physical activity, and it helps with reducing stress.   Patient stated that she have written in her journal every day in the past two weeks except for two days. This was due to her falling asleep on the couch. Patient stated that writing in her journal helps and allows her to reflect on the positive things that she accomplished during the day. This helps lower her stress level and make her feel better.   Patient has been able to prioritize herself and her health. Patient feels good about saying "no" to people and not feeling bad about it, to reduce stress from taking on too many responsibilities or neglecting her self-care. This way, she does not  feel obligated to do something. Patient is setting up healthy boundaries.   Patient confidence has increased, which helps her relax. She believes in her own abilities now, helping to reduce her stress. Patient's self-awareness has increased also.    Indicators of Success and Accountability: Patient stated that her being able to keep a schedule to aid in using time wisely to complete tasks/assignments by or before the due date without being stressed is her indicator of success and accountability.  Readiness: Patient is in the action phase of stress management.  Strengths and Supports: Patient stated that she is confident, knowing that she can achieve her goal.   Challenges and Barriers: Schedule changes with the incident happening with her class assignments and being bored.   Coaching Outcomes: Patient stated that taking time for herself to regroup is important. Self-care has become an interest of the patient and wanting to prioritize her health.   Patient realized that she is working hard putting a lot of thought into processing stress.   Patient stated that she wants to keep her steps the same for the next two weeks because they are working.   Attempted: Marland Kitchen Fulfilled - Patient has been able to follow a schedule to prioritize assignments/tasks. . Partial - Patient was able to  walk according to her schedule except on one day. Patient wrote in journal every day except two days in the past two weeks.

## 2021-01-14 ENCOUNTER — Telehealth: Payer: Self-pay | Admitting: *Deleted

## 2021-01-14 ENCOUNTER — Ambulatory Visit: Payer: BC Managed Care – PPO | Admitting: Cardiovascular Disease

## 2021-01-14 NOTE — Telephone Encounter (Signed)
Patient was rescheduled from [facility] incorrectly Left message to call back to reschedule on [facility] clinic day

## 2021-01-21 ENCOUNTER — Ambulatory Visit: Payer: BC Managed Care – PPO

## 2021-01-21 ENCOUNTER — Telehealth: Payer: Self-pay

## 2021-01-21 DIAGNOSIS — Z0000 Encounter for general adult medical examination without abnormal findings: Secondary | ICD-10-CM

## 2021-01-21 NOTE — Telephone Encounter (Signed)
Called patient during scheduled health coaching session. Left patient a message to return call to Care Guide to hold session over the phone or to reschedule [phone]).

## 2021-02-09 NOTE — Telephone Encounter (Signed)
Spoke with patient and got her scheduled on [facility] clinic day

## 2021-02-11 ENCOUNTER — Telehealth: Payer: Self-pay

## 2021-02-11 DIAGNOSIS — Z0000 Encounter for general adult medical examination without abnormal findings: Secondary | ICD-10-CM

## 2021-02-11 NOTE — Telephone Encounter (Signed)
Called patient to reschedule missed health coaching session on 01/21/21 because patient had not returned call at this point. Patient stated that she had been busy with working and her schedule did not permit her to hold the sessions during business hours. Patient wanted to rescheduled for next week. Patient has been rescheduled for a telephonic health coaching session on 02/19/21 at 3:30pm.

## 2021-02-19 ENCOUNTER — Ambulatory Visit (INDEPENDENT_AMBULATORY_CARE_PROVIDER_SITE_OTHER): Payer: BC Managed Care – PPO

## 2021-02-19 ENCOUNTER — Other Ambulatory Visit: Payer: Self-pay

## 2021-02-19 DIAGNOSIS — Z0000 Encounter for general adult medical examination without abnormal findings: Secondary | ICD-10-CM

## 2021-02-19 NOTE — Progress Notes (Signed)
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 Helen Young stated that she had to pack up the classroom, monitor EOG testing, and organize graduation/reception in a short period of time. Helen Young felt that during this time, it was helpful for her to think about the fact that she was prioritizing the students and stated that "it's for the kids!" when she was faced with a situation between her and a co-worker.  Helen Young stated that she just had to get through it and started stressing out in addition to other things that were going on, which knocked her off of her schedule/routine. Helen Young stated that she was getting home and winding down between 6-7pm. Helen Young stated that she was just tired, so she ate and went to sleep by 8pm. Helen Young stated that she started back adhering to her walking schedule on May 30th [facility] Day). Helen Young shared that her granddaughter often reminds her when she needs to go walking, which helps if she happens to forget.   Helen Young stated that she has not written in her journal yet. Helen Young stated that she had taken the week to relax and wind down, but will resume writing in her journal this evening. Helen Young believes that journaling is helpful because she can write about something stressful so she won't constantly think about the issue, but rather think about the best way to get things done or solve something. Helen Young stated that she has been using  positive self-talk to remind herself that she had made a lot of progress in not being stressed.  Appointment Outcome:  Completed, Session #: 4 Start time: 3:33pm   End time: 3:58pm   Total Mins: 25 minutes  AGREEMENTS SECTION   Overall Goal(s): Stress management                                                 Agreement/Action Steps: Writing in stress journal for 30 minutes at 8:00pm Write down daily schedule to prioritize assignments/tasks (e.g., checking emails) Walk 3 miles on Wed @ 6pm, Sat & Sun in the mornings  Progress Notes:  Helen Young stated that things had been stressful towards the end of the school year with having to organizing end-of-year events for 5th graders. Helen Young stated that she had to pack up the classroom, monitor EOG testing, and organize graduation/reception in a short period of time. Helen Young felt that during this time, it was helpful for her to think about the fact that she was prioritizing the students and stated that "it's for the kids!" when she was faced with a situation between her and a co-worker.  Helen Young stated that she  just had to get through it and started stressing out in addition to other things that were going on, which knocked her off her schedule/routine. Helen Young stated that she was getting home and winding down between 6-7pm. Helen Young stated that she was just tired, so she ate and went to sleep by 8pm. Helen Young stated that she started back adhering to her walking schedule on May 30th [facility] Day). Helen Young shared that her granddaughter often reminds her when she needs to go walking, which helps if she happens to forget.   Helen Young stated that she has not written in her journal yet. Helen Young stated that she had taken the week to relax and wind down but will resume writing in her journal this evening. Helen Young believes that journaling is helpful because she can write about something stressful, so she won't constantly think about the issue, but rather think about  the best way to get things done or solve something. Helen Young stated that she has been using positive self-talk to remind herself that she had made a lot of progress in not being stressed.   Helen Young stated that she will be taking 5 classes during the summer (1 class per week). Helen Young stated that the classes started on Monday and will have to create a daily schedule to prioritize assignment/tasks. Helen Young stated that she will have more time and space to get things done, so it shouldn't be as stressful.   Indicators of Success and Accountability:  Helen Young stated that being able to prioritize the graduation/reception for the students helped make it a success with following a schedule, which is her indicator of success and accountability despite being stressed.  Readiness: Helen Young is in the action phase of stress management.  Strengths and Supports: Helen Young is being supported by her family. Helen Young's strength is being consistent. Challenges and Barriers: Helen Young did not foresee any challenges except her classes possibly being one to keeping up with her schedule.   Coaching Outcomes: Helen Young will continue to work on action steps as outlined above.  Helen Young stated that she will have to write out another schedule to implement when her 5 classes start over the summer with taking 1 class per week.   Helen Young stated that she realized that she can't let stress get her back to the place that she was in before and that there is a light at the end of the tunnel.   Attempted: Fulfilled - Helen Young has been walking as scheduled over the past two weeks. Helen Young has been utilizing a scheduled to prioritize tasks.  Not met - Helen Young has not written in her journal yet.    Helen Young stated that she will be taking 5 classes during the summer (1 class per week). Helen Young stated that the classes started on Monday and will have to create a daily schedule to prioritize assignment/tasks. Helen Young stated that she will have more  time and space to get things done, so it shouldn't be as stressful.   Indicators of Success and Accountability:  Helen Young stated that being able to prioritize the graduation/reception for the students helped make it a success with following a schedule, which is her indicator of success and accountability in spite of being stressed.  Readiness: Helen Young is in the action phase of stress management.  Strengths and Supports: Helen Young is being supported by her family. Helen Young's strength is being consistent. Challenges and Barriers: Helen Young did not foresee any challenges except her classes possibly being one to keeping up with her schedule.   Coaching Outcomes: Helen Young will  continue to work on action steps as outlined above.  Helen Young stated that she will have to write out another schedule to implement when her 5 classes start over the summer with taking 1 class per week.   Helen Young stated that she realized that she can't let stress get her back to the place that she was in before and that there is a light at the end of the tunnel.   Attempted: Fulfilled - Helen Young has been walking as scheduled over the past two weeks. Helen Young has been utilizing a scheduled to prioritize tasks.  Not met - Helen Young has not written in her journal yet.

## 2021-02-26 ENCOUNTER — Other Ambulatory Visit: Payer: Self-pay | Admitting: Internal Medicine

## 2021-03-05 ENCOUNTER — Ambulatory Visit: Payer: BC Managed Care – PPO

## 2021-03-05 ENCOUNTER — Telehealth: Payer: Self-pay

## 2021-03-05 DIAGNOSIS — Z0000 Encounter for general adult medical examination without abnormal findings: Secondary | ICD-10-CM

## 2021-03-05 NOTE — Telephone Encounter (Signed)
Called patient to hold health coaching session over the phone. Left patient a message to return call to Care Guide at [phone] to reschedule or to complete session over the phone today.

## 2021-03-16 ENCOUNTER — Other Ambulatory Visit: Payer: Self-pay

## 2021-03-16 ENCOUNTER — Encounter (HOSPITAL_BASED_OUTPATIENT_CLINIC_OR_DEPARTMENT_OTHER): Payer: Self-pay | Admitting: Cardiovascular Disease

## 2021-03-16 ENCOUNTER — Ambulatory Visit (HOSPITAL_BASED_OUTPATIENT_CLINIC_OR_DEPARTMENT_OTHER): Payer: BC Managed Care – PPO | Admitting: Cardiovascular Disease

## 2021-03-16 DIAGNOSIS — I10 Essential (primary) hypertension: Secondary | ICD-10-CM | POA: Diagnosis not present

## 2021-03-16 NOTE — Patient Instructions (Signed)
Medication Instructions:  Your physician recommends that you continue on your current medications as directed. Please refer to the Current Medication list given to you today.   Labwork: NONE  Testing/Procedures: NONE  Follow-Up: AS NEEDED   Any Other Special Instructions Will Be Listed Below (If Applicable).  WILL HAVE PREP TEAM REACH OUT TO YOU

## 2021-03-16 NOTE — Assessment & Plan Note (Addendum)
Blood pressure was slightly above goal in clinic today.  However it has been very well-controlled at home.  In fact, it has been low at times.  Therefore we will not make any changes to her regimen.  Continue amlodipine, valsartan, and HCTZ.  She was encouraged to keep up her exercise and we will reach out to the PREP program so that she can get started with them.  Continue with stress management.

## 2021-03-16 NOTE — Progress Notes (Signed)
Hypertension Clinic Initial Assessment:    Date:  03/19/2021   ID:  Helen Young, DOB 1961/04/21, MRN [id number]  PCP:  Binnie Rail, MD  Cardiologist:  None  Nephrologist:  Referring MD: Binnie Rail, MD   CC: Hypertension  History of Present Illness:    Helen Young is a 60 y.o. female with a hx of hypertension here for follow up. Establish care in the hypertension clinic in [id number]. She was first diagnosed with hypertension about 12 years.  It was initially well-controlled but she has struggled lately.  She has been under a lot of stress.  She is taking two classes and also teaching.  Sometimes when she gets stressed she gets short of breath.  She exercises using a dance video.  4 days/week for 20 to 30 minutes.  She has no exertional chest pain or shortness of breath.  She feels good with exercise.  She denies lower extremity edema, orthopnea, or PND.  She has been working on her diet as well.  She has been able to lose about 10 pounds.  When she checks her blood pressure at home she often just is done and checks it.  She notes that yesterday when she checked that she sat down and waited for a while and her blood pressure was in the 1 teens over 66s.  She wondered if it was accurate.  She saw Dr. Garwin Brothers on 09/01/20 and her BP was 147/106.  At that time she had recently been started on amlodipine.  Prior to that she was taking losartan/HCTZ.  She notes that she takes her diuretic at night.  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 At her initial appointment, her BP was nearly controllled. She was referred to PREP. She also had renal dopplers that were normal in 10/2020.  Today, overall she is well. Her blood pressure has been stable.  Recently her grandchildren came to her home and stay for the summer and that tends to make her blood pressure elevate. She has rare episode of fatigue.  She has recently been doing stress management and writing in her journal in the  evenings to reduce stress. She works with our [employer] and finds her helpful.  She started PREP, but she could only attend Thursdays so she stopped.  For exercise, she use to walk in the mornings but since the hot weather she has stopped. Patient denies chest pain, shortness of breath, abdominal pain, diarrhea, palpitations, nausea, headaches, lightheadedness, LE Edema, syncope,orthopnea or PND.   Past Medical History:  Diagnosis Date   Allergy    seasonal, environmental   Environmental allergies 02-27-13   OTC meds daily   Headache(784.0) 02-27-13   occ. migraines   HTN (hypertension)    Transfusion history    childhood s/p Tonsillectomy    Past Surgical History:  Procedure Laterality Date   ABDOMINAL HYSTERECTOMY     BLADDER REPAIR     BREAST REDUCTION SURGERY  03/16/2016   COLONOSCOPY     CYSTOSTOMY N/A 03/02/2013   Procedure: CYSTOSTOMY CLOSURE ;  Surgeon: Alexis Frock, MD;  Location: [facility];  Service: Urology;  Laterality: N/A;   INCISIONAL HERNIA REPAIR N/A 03/02/2013   Procedure: LAPAROSCOPIC INCISIONAL HERNIA CONVERTED TO OPEN ;  Surgeon: Harl Bowie, MD;  Location: [facility];  Service: General;  Laterality: N/A;   INGUINAL HERNIA REPAIR     x's 2    KNEE SURGERY Left  torn meniscus - arthroscopic repair   NECK SURGERY     Disc replaced with a steel plate   TONSILLECTOMY     childhoood   TOTAL ABDOMINAL HYSTERECTOMY W/ BILATERAL SALPINGOOPHORECTOMY     prolapsed uterus at 23-17 years old, ovaries and tubes removed 10 years later due to cysts    Current Medications: Current Meds  Medication Sig   amLODipine (NORVASC) 5 MG tablet TAKE 1 TABLET(5 MG) BY MOUTH DAILY   Calcium Citrate-Vitamin D (CALCIUM CITRATE + D3) 200-250 MG-UNIT TABS Take 1 capsule by mouth daily.   meloxicam (MOBIC) 15 MG tablet Take 1 tablet (15 mg total) by mouth daily. Take with food   potassium gluconate 595 (99 K) MG TABS tablet Take 595 mg by mouth daily.   tiZANidine (ZANAFLEX) 2 MG  tablet Take 1 tablet (2 mg total) by mouth at bedtime.   traZODone (DESYREL) 50 MG tablet TAKE 1 TABLET(50 MG) BY MOUTH AT BEDTIME AS NEEDED FOR SLEEP   valsartan-hydrochlorothiazide (DIOVAN-HCT) 320-25 MG tablet Take 1 tablet by mouth daily.   vitamin B-12 (CYANOCOBALAMIN) 500 MCG tablet Take 500 mcg by mouth daily.     Allergies:   Fish allergy and Percocet [oxycodone-acetaminophen]   Social History   Socioeconomic History   Marital status: Single    Spouse name: Not on file   Number of children: 3   Years of education: Not on file   Highest education level: Not on file  Occupational History   Occupation: [employer]    Employer: [employer]  Tobacco Use   Smoking status: Former    Types: Cigarettes    Quit date: 09/06/2005    Years since quitting: 15.5   Smokeless tobacco: Never  Vaping Use   Vaping Use: Never used  Substance and Sexual Activity   Alcohol use: Yes    Alcohol/week: 0.0 standard drinks    Comment: Rarely   Drug use: No   Sexual activity: Yes  Other Topics Concern   Not on file  Social History Narrative   No regular exercise right now   Social Determinants of Health   Financial Resource Strain: Low Risk    Difficulty of Paying Living Expenses: Not hard at all  Food Insecurity: No Food Insecurity   Worried About Charity fundraiser in the Last Year: Never true   Ran Out of Food in the Last Year: Never true  Transportation Needs: No Transportation Needs   Lack of Transportation (Medical): No   Lack of Transportation (Non-Medical): No  Physical Activity: Inactive   Days of Exercise per Week: 0 days   Minutes of Exercise per Session: 0 min  Stress: No Stress Concern Present   Feeling of Stress : Not at all  Social Connections: Moderately Integrated   Frequency of Communication with Friends and Family: Twice a week   Frequency of Social Gatherings with Friends and Family: Twice a week   Attends Religious Services: 1 to 4 times per year    Active Member of [employer] or Organizations: No   Attends Archivist Meetings: 1 to 4 times per year   Marital Status: Widowed     Family History: The patient's family history includes Colon cancer in her father; Colon polyps in her father; Diabetes in her maternal grandfather; Heart disease in her father and mother; Heart failure in her cousin, father, and paternal uncle; Hypertension in her father and mother; Kidney disease in her maternal grandfather; Prostate cancer in her father;  Stroke in her mother; Sudden death in her paternal grandfather and paternal uncle. There is no history of Esophageal cancer, Rectal cancer, or Stomach cancer.  ROS:   Please see the history of present illness.    (+)fatigued  All other systems reviewed and are negative.  EKGs/Labs/Other Studies Reviewed:    EKG:   03/16/2021: No EKG ordered today  sinus rhythm.  Rate 87 bpm.    Recent Labs: 07/30/2020: ALT 12; BUN 10; Creatinine, Ser 0.79; Potassium 4.2; Sodium 140 10/16/2020: TSH 1.020   Recent Lipid Panel    Component Value Date/Time   CHOL 208 (H) 03/06/2019 1530   TRIG 250.0 (H) 03/06/2019 1530   HDL 55.50 03/06/2019 1530   CHOLHDL 4 03/06/2019 1530   VLDL 50.0 (H) 03/06/2019 1530   LDLCALC 129 (H) 12/01/2016 1051   LDLDIRECT 118.0 03/06/2019 1530    Physical Exam:    VS:  BP (!) 142/90 (BP Location: Left Arm, Patient Position: Sitting)   Pulse 74   Ht 5\' 4"  (1.626 m)   Wt 190 lb (86.2 kg)   BMI 32.61 kg/m  , BMI Body mass index is 32.61 kg/m. GENERAL:  Well appearing HEENT: Pupils equal round and reactive, fundi not visualized, oral mucosa unremarkable NECK:  No jugular venous distention, waveform within normal limits, carotid upstroke brisk and symmetric, no bruits LUNGS:  Clear to auscultation bilaterally HEART:  RRR.  PMI not displaced or sustained,S1 and S2 within normal limits, no S3, no S4, no clicks, no rubs, no murmurs ABD:  Flat, positive bowel sounds normal in  frequency in pitch, no bruits, no rebound, no guarding, no midline pulsatile mass, no hepatomegaly, no splenomegaly EXT:  2 plus pulses throughout, no edema, no cyanosis no clubbing SKIN:  No rashes no nodules NEURO:  Cranial nerves II through XII grossly intact, motor grossly intact throughout PSYCH:  Cognitively intact, oriented to person place and time   ASSESSMENT:    1. Primary hypertension      PLAN:    # Essential hypertension: Blood pressure was slightly above goal in clinic today.  However it has been very well-controlled at home.  In fact, it has been low at times.  Therefore we will not make any changes to her regimen.  Continue amlodipine, valsartan, and HCTZ.  She was encouraged to keep up her exercise and we will reach out to the PREP program so that she can get started with them.  Continue with stress management.  Disposition:    FU with MD/PharmD as needed.   Medication Adjustments/Labs and Tests Ordered: Current medicines are reviewed at length with the patient today.  Concerns regarding medicines are outlined above.  No orders of the defined types were placed in this encounter.  No orders of the defined types were placed in this encounter.  I,Jada Bradford,acting as a Education administrator for Skeet Latch, MD.,have documented all relevant documentation on the behalf of Skeet Latch, MD,as directed by  Skeet Latch, MD while in the presence of Skeet Latch, MD.  I, McQueeney Oval Linsey, MD have reviewed all documentation for this visit.  The documentation of the exam, diagnosis, procedures, and orders on 03/19/2021 are all accurate and complete.     Signed, Skeet Latch, MD  03/19/2021 4:19 PM    [facility]

## 2021-03-17 ENCOUNTER — Telehealth: Payer: Self-pay

## 2021-03-17 NOTE — Telephone Encounter (Signed)
Called re: PREP class, left voicemail to return call.

## 2021-03-17 NOTE — Telephone Encounter (Signed)
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

## 2021-03-19 ENCOUNTER — Telehealth: Payer: Self-pay

## 2021-03-19 DIAGNOSIS — Z0000 Encounter for general adult medical examination without abnormal findings: Secondary | ICD-10-CM

## 2021-03-19 NOTE — Telephone Encounter (Signed)
Called patient to discuss rescheduling health coaching session that was missed. Left message for patient to return call to Care Guide at [phone].

## 2021-03-30 ENCOUNTER — Telehealth: Payer: Self-pay

## 2021-03-30 NOTE — Telephone Encounter (Signed)
Left message requesting call back reference evening PREP class starting in August

## 2021-05-08 ENCOUNTER — Ambulatory Visit (HOSPITAL_BASED_OUTPATIENT_CLINIC_OR_DEPARTMENT_OTHER): Payer: BC Managed Care – PPO | Admitting: Cardiovascular Disease

## 2021-05-27 ENCOUNTER — Other Ambulatory Visit: Payer: Self-pay | Admitting: Internal Medicine

## 2021-06-16 NOTE — Progress Notes (Signed)
Subjective:    Patient ID: Helen Young, female    DOB: Oct 11, 1960, 60 y.o.   MRN: [id number]  This visit occurred during the SARS-CoV-2 public health emergency.  Safety protocols were in place, including screening questions prior to the visit, additional usage of staff PPE, and extensive cleaning of exam room while observing appropriate contact time as indicated for disinfecting solutions.    HPI The patient is here for an acute visit.    Cold symptoms: She has had symptoms for 2 weeks.  They have waxed and waned a little bit, but has been persistent and she feels like she is not getting better.  She states chills, fatigue, mild nasal congestion, postnasal drainage, mild sore throat, cough that is productive at times and mild shortness of breath at times.  She denies any current fevers.  She was concerned because the chest congestion does not seem to be resolving and has gotten worse.  Taking cough suppressant, decongestant, sinus medication.  She has been drinking a lot of fluids.    Medications and allergies reviewed with patient and updated if appropriate.  Patient Active Problem List   Diagnosis Date Noted   Neck pain 12/06/2020   Urinary urgency 07/29/2019   LLQ pain 03/21/2019   Diverticulosis 03/21/2019   Fatty liver 03/21/2019   Maxillary sinus polyp 11/06/2018   Lower back pain 08/23/2018   Right rotator cuff tear 05/19/2017   Vitamin D deficiency 12/01/2016   Sleep difficulties 08/11/2016   Numbness in left leg 07/16/2015   Back pain 12/06/2014   Obesity (BMI 30-39.9) 10/15/2013   Recurrent ventral incisional hernia 02/09/2013   Thoracic back pain 10/09/2012   HTN (hypertension) 04/06/2012   Scoliosis 04/06/2012   Environmental allergies 04/06/2012    Current Outpatient Medications on File Prior to Visit  Medication Sig Dispense Refill   amLODipine (NORVASC) 5 MG tablet TAKE 1 TABLET(5 MG) BY MOUTH DAILY 90 tablet 1   Calcium Citrate-Vitamin D (CALCIUM  CITRATE + D3) 200-250 MG-UNIT TABS Take 1 capsule by mouth daily.     meloxicam (MOBIC) 15 MG tablet Take 1 tablet (15 mg total) by mouth daily. Take with food 14 tablet 0   potassium gluconate 595 (99 K) MG TABS tablet Take 595 mg by mouth daily.     tiZANidine (ZANAFLEX) 2 MG tablet Take 1 tablet (2 mg total) by mouth at bedtime. 20 tablet 0   traZODone (DESYREL) 50 MG tablet TAKE 1 TABLET(50 MG) BY MOUTH AT BEDTIME AS NEEDED FOR SLEEP 90 tablet 1   valsartan-hydrochlorothiazide (DIOVAN-HCT) 320-25 MG tablet Take 1 tablet by mouth daily. 30 tablet 5   vitamin B-12 (CYANOCOBALAMIN) 500 MCG tablet Take 500 mcg by mouth daily.     No current facility-administered medications on file prior to visit.    Past Medical History:  Diagnosis Date   Allergy    seasonal, environmental   Environmental allergies 02-27-13   OTC meds daily   Headache(784.0) 02-27-13   occ. migraines   HTN (hypertension)    Transfusion history    childhood s/p Tonsillectomy    Past Surgical History:  Procedure Laterality Date   ABDOMINAL HYSTERECTOMY     BLADDER REPAIR     BREAST REDUCTION SURGERY  03/16/2016   COLONOSCOPY     CYSTOSTOMY N/A 03/02/2013   Procedure: CYSTOSTOMY CLOSURE ;  Surgeon: Alexis Frock, MD;  Location: [facility];  Service: Urology;  Laterality: N/A;   INCISIONAL HERNIA REPAIR N/A 03/02/2013   Procedure:  LAPAROSCOPIC INCISIONAL HERNIA CONVERTED TO OPEN ;  Surgeon: Harl Bowie, MD;  Location: [facility];  Service: General;  Laterality: N/A;   INGUINAL HERNIA REPAIR     x's 2    KNEE SURGERY Left    torn meniscus - arthroscopic repair   NECK SURGERY     Disc replaced with a steel plate   TONSILLECTOMY     childhoood   TOTAL ABDOMINAL HYSTERECTOMY W/ BILATERAL SALPINGOOPHORECTOMY     prolapsed uterus at 66-72 years old, ovaries and tubes removed 10 years later due to cysts    Social History   Socioeconomic History   Marital status: Single    Spouse name: Not on file   Number of  children: 3   Years of education: Not on file   Highest education level: Not on file  Occupational History   Occupation: PROGRAM COODINATIOR    Employer: [employer]  Tobacco Use   Smoking status: Former    Types: Cigarettes    Quit date: 09/06/2005    Years since quitting: 15.7   Smokeless tobacco: Never  Vaping Use   Vaping Use: Never used  Substance and Sexual Activity   Alcohol use: Yes    Alcohol/week: 0.0 standard drinks    Comment: Rarely   Drug use: No   Sexual activity: Yes  Other Topics Concern   Not on file  Social History Narrative   No regular exercise right now   Social Determinants of Health   Financial Resource Strain: Low Risk    Difficulty of Paying Living Expenses: Not hard at all  Food Insecurity: No Food Insecurity   Worried About Charity fundraiser in the Last Year: Never true   Ran Out of Food in the Last Year: Never true  Transportation Needs: No Transportation Needs   Lack of Transportation (Medical): No   Lack of Transportation (Non-Medical): No  Physical Activity: Inactive   Days of Exercise per Week: 0 days   Minutes of Exercise per Session: 0 min  Stress: No Stress Concern Present   Feeling of Stress : Not at all  Social Connections: Moderately Integrated   Frequency of Communication with Friends and Family: Twice a week   Frequency of Social Gatherings with Friends and Family: Twice a week   Attends Religious Services: 1 to 4 times per year   Active Member of [employer] or Organizations: No   Attends Archivist Meetings: 1 to 4 times per year   Marital Status: Widowed    Family History  Problem Relation Age of Onset   Colon cancer Father    Prostate cancer Father    Heart disease Father    Hypertension Father    Colon polyps Father    Heart failure Father    Heart disease Mother    Stroke Mother    Hypertension Mother    Kidney disease Maternal Grandfather    Diabetes Maternal Grandfather    Sudden death Paternal  Grandfather    Sudden death Paternal Uncle        Cardiac issues   Heart failure Paternal Uncle    Heart failure Cousin    Esophageal cancer Neg Hx    Rectal cancer Neg Hx    Stomach cancer Neg Hx     Review of Systems  Constitutional:  Positive for chills and fatigue. Negative for fever.  HENT:  Positive for congestion (mild), postnasal drip and sore throat. Negative for ear pain and sinus pain.  Respiratory:  Positive for cough (productive at times) and shortness of breath (mild). Negative for wheezing.   Cardiovascular:  Negative for chest pain.  Gastrointestinal:  Negative for diarrhea and nausea.  Neurological:  Negative for dizziness and headaches.      Objective:   Vitals:   06/17/21 1507  BP: (!) 146/94  Pulse: 84  Temp: 98 F (36.7 C)  SpO2: 98%   BP Readings from Last 3 Encounters:  06/17/21 (!) 146/94  03/16/21 (!) 142/90  12/05/20 130/88   Wt Readings from Last 3 Encounters:  06/17/21 192 lb (87.1 kg)  03/16/21 190 lb (86.2 kg)  12/05/20 194 lb 6.4 oz (88.2 kg)   Body mass index is 32.96 kg/m.   Physical Exam    GENERAL APPEARANCE: Appears stated age, well appearing, NAD EYES: conjunctiva clear, no icterus HENT: bilateral tympanic membranes and ear canals normal, oropharynx with no erythema or exudates, trachea midline, no cervical or supraclavicular lymphadenopathy LUNGS: Unlabored breathing, good air entry bilaterally, clear to auscultation without wheeze or crackles CARDIOVASCULAR: Normal S1,S2 , no edema SKIN: Warm, dry      Assessment & Plan:    See Problem List for Assessment and Plan of chronic medical problems.

## 2021-06-17 ENCOUNTER — Encounter: Payer: Self-pay | Admitting: Internal Medicine

## 2021-06-17 ENCOUNTER — Ambulatory Visit: Payer: BC Managed Care – PPO | Admitting: Internal Medicine

## 2021-06-17 DIAGNOSIS — J069 Acute upper respiratory infection, unspecified: Secondary | ICD-10-CM | POA: Insufficient documentation

## 2021-06-17 DIAGNOSIS — J22 Unspecified acute lower respiratory infection: Secondary | ICD-10-CM | POA: Insufficient documentation

## 2021-06-17 MED ORDER — CEFDINIR 300 MG PO CAPS: 300.0000 mg | ORAL_CAPSULE | Freq: Two times a day (BID) | ORAL | 0 refills | Status: DC

## 2021-06-17 NOTE — Patient Instructions (Signed)
   Continue your cold medications.     Medications changes include :   Omnicef 300 mg twice daily  Your prescription(s) have been submitted to your pharmacy. Please take as directed and contact our office if you believe you are having problem(s) with the medication(s).   Please call if there is no improvement in your symptoms.

## 2021-06-17 NOTE — Assessment & Plan Note (Signed)
Acute Having increased chest congestion and cough likely secondary to sinus infection Symptoms not improving and are getting worse after 2 weeks Start Omnicef 300 mg twice daily x10 days Continue over-the-counter cold medications Continue increased rest, fluids Call if no improvement

## 2021-08-27 LAB — HM MAMMOGRAPHY

## 2021-12-15 NOTE — Progress Notes (Signed)
? ? ? ? ?Subjective:  ? ? Patient ID: Helen Young, female    DOB: 1960-11-06, 61 y.o.   MRN: [id number] ? ?This visit occurred during the SARS-CoV-2 public health emergency.  Safety protocols were in place, including screening questions prior to the visit, additional usage of staff PPE, and extensive cleaning of exam room while observing appropriate contact time as indicated for disinfecting solutions.   ? ? ?HPI ?Maxima is here for follow up of her chronic medical problems, including htn, obesity ? ?She has been w/o BP medication for about one month.  She switched jobs and has new insurance.   ? ?Pain on left side of abdomen - after eating would have pain and would have to vomit.  She has been eating lightly and has not vomited.  The pain is less intense - now just soreness.  When the pain was severe it was 8/10.  The pain started two weeks ago.  Vomiting started last week.  She denies nausea.  Eating light helps - white rice seems to help.   ? ? ? ? ? ?Medications and allergies reviewed with patient and updated if appropriate. ? ?Current Outpatient Medications on File Prior to Visit  ?Medication Sig Dispense Refill  ? amLODipine (NORVASC) 5 MG tablet TAKE 1 TABLET(5 MG) BY MOUTH DAILY 90 tablet 1  ? Calcium Citrate-Vitamin D (CALCIUM CITRATE + D3) 200-250 MG-UNIT TABS Take 1 capsule by mouth daily.    ? potassium gluconate 595 (99 K) MG TABS tablet Take 595 mg by mouth daily.    ? traZODone (DESYREL) 50 MG tablet TAKE 1 TABLET(50 MG) BY MOUTH AT BEDTIME AS NEEDED FOR SLEEP 90 tablet 1  ? valsartan-hydrochlorothiazide (DIOVAN-HCT) 320-25 MG tablet Take 1 tablet by mouth daily. 30 tablet 5  ? vitamin B-12 (CYANOCOBALAMIN) 500 MCG tablet Take 500 mcg by mouth daily.    ? ?No current facility-administered medications on file prior to visit.  ? ? ? ?Review of Systems  ?Constitutional:  Negative for chills and fever.  ?Respiratory:  Negative for cough, shortness of breath and wheezing.   ?Cardiovascular:  Negative for  chest pain, palpitations and leg swelling.  ?Gastrointestinal:  Positive for abdominal pain (dull pain in left side -  stronger with walking around) and vomiting. Negative for constipation, diarrhea and nausea.  ?     No gerd  ?Genitourinary:  Negative for difficulty urinating, dysuria, frequency, hematuria and urgency.  ?Neurological:  Positive for headaches (for the past couple of days). Negative for light-headedness.  ? ?   ?Objective:  ? ?Vitals:  ? 12/16/21 1428  ?BP: 138/90  ?Pulse: 86  ?Temp: 98.4 ?F (36.9 ?C)  ?SpO2: 96%  ? ?BP Readings from Last 3 Encounters:  ?12/16/21 138/90  ?06/17/21 (!) 146/94  ?03/16/21 (!) 142/90  ? ?Wt Readings from Last 3 Encounters:  ?12/16/21 191 lb (86.6 kg)  ?06/17/21 192 lb (87.1 kg)  ?03/16/21 190 lb (86.2 kg)  ? ?Body mass index is 32.79 kg/m?. ? ?  ?Physical Exam ?Constitutional:   ?   General: She is not in acute distress. ?   Appearance: Normal appearance.  ?HENT:  ?   Head: Normocephalic and atraumatic.  ?Eyes:  ?   Conjunctiva/sclera: Conjunctivae normal.  ?Cardiovascular:  ?   Rate and Rhythm: Normal rate and regular rhythm.  ?   Heart sounds: Normal heart sounds. No murmur heard. ?Pulmonary:  ?   Effort: Pulmonary effort is normal. No respiratory distress.  ?  Breath sounds: Normal breath sounds. No wheezing.  ?Abdominal:  ?   General: Abdomen is protuberant.  ?   Palpations: Abdomen is soft. There is no hepatomegaly.  ?   Tenderness: There is abdominal tenderness in the left lower quadrant. There is no guarding or rebound. Negative signs include Turrell's sign and McBurney's sign.  ?Musculoskeletal:  ?   Cervical back: Neck supple.  ?   Right lower leg: No edema.  ?   Left lower leg: No edema.  ?Lymphadenopathy:  ?   Cervical: No cervical adenopathy.  ?Skin: ?   General: Skin is warm and dry.  ?   Findings: No rash.  ?Neurological:  ?   Mental Status: She is alert. Mental status is at baseline.  ?Psychiatric:     ?   Mood and Affect: Mood normal.  ? ?   ? ?Lab  Results  ?Component Value Date  ? WBC 6.2 07/24/2019  ? HGB 13.4 07/24/2019  ? HCT 40.4 07/24/2019  ? PLT 366.0 07/24/2019  ? GLUCOSE 80 07/30/2020  ? CHOL 208 (H) 03/06/2019  ? TRIG 250.0 (H) 03/06/2019  ? HDL 55.50 03/06/2019  ? LDLDIRECT 118.0 03/06/2019  ? LDLCALC 129 (H) 12/01/2016  ? ALT 12 07/30/2020  ? AST 16 07/30/2020  ? NA 140 07/30/2020  ? K 4.2 07/30/2020  ? CL 104 07/30/2020  ? CREATININE 0.79 07/30/2020  ? BUN 10 07/30/2020  ? CO2 31 07/30/2020  ? TSH 1.020 10/16/2020  ? ? ? ?Assessment & Plan:  ? ? ?See Problem List for Assessment and Plan of chronic medical problems.  ? ? ?

## 2021-12-15 NOTE — Patient Instructions (Addendum)
? ? ? ?  Blood work was ordered.   ? ? ?Medications changes include :  restart BP medications  ? ? ?Your prescription(s) have been sent to your pharmacy.  ? ? ?A Ct scan was ordered.    Someone will call you to schedule an appointment.  ? ? ?Return in about 6 months (around 06/17/2022). ? ? ?[facility] ?[address], [place] [address]   ?[phone] (Work)   ?[phone] (Fax)   ?

## 2021-12-16 ENCOUNTER — Ambulatory Visit (INDEPENDENT_AMBULATORY_CARE_PROVIDER_SITE_OTHER): Payer: BC Managed Care – PPO | Admitting: Internal Medicine

## 2021-12-16 ENCOUNTER — Encounter: Payer: Self-pay | Admitting: Internal Medicine

## 2021-12-16 VITALS — BP 138/90 | HR 86 | Temp 98.4°F | Ht 64.0 in | Wt 191.0 lb

## 2021-12-16 DIAGNOSIS — I10 Essential (primary) hypertension: Secondary | ICD-10-CM | POA: Diagnosis not present

## 2021-12-16 DIAGNOSIS — R112 Nausea with vomiting, unspecified: Secondary | ICD-10-CM | POA: Diagnosis not present

## 2021-12-16 DIAGNOSIS — R1032 Left lower quadrant pain: Secondary | ICD-10-CM

## 2021-12-16 DIAGNOSIS — G479 Sleep disorder, unspecified: Secondary | ICD-10-CM

## 2021-12-16 LAB — CBC WITH DIFFERENTIAL/PLATELET
Basophils Absolute: 0 10*3/uL (ref 0.0–0.1)
Basophils Relative: 0.7 % (ref 0.0–3.0)
Eosinophils Absolute: 0.1 10*3/uL (ref 0.0–0.7)
Eosinophils Relative: 2.3 % (ref 0.0–5.0)
HCT: 37.8 % (ref 36.0–46.0)
Hemoglobin: 12.4 g/dL (ref 12.0–15.0)
Lymphocytes Relative: 44.8 % (ref 12.0–46.0)
Lymphs Abs: 2.5 10*3/uL (ref 0.7–4.0)
MCHC: 32.8 g/dL (ref 30.0–36.0)
MCV: 89.1 fl (ref 78.0–100.0)
Monocytes Absolute: 0.4 10*3/uL (ref 0.1–1.0)
Monocytes Relative: 7.2 % (ref 3.0–12.0)
Neutro Abs: 2.5 10*3/uL (ref 1.4–7.7)
Neutrophils Relative %: 45 % (ref 43.0–77.0)
Platelets: 350 10*3/uL (ref 150.0–400.0)
RBC: 4.25 Mil/uL (ref 3.87–5.11)
RDW: 12.5 % (ref 11.5–15.5)
WBC: 5.5 10*3/uL (ref 4.0–10.5)

## 2021-12-16 LAB — COMPREHENSIVE METABOLIC PANEL
ALT: 13 U/L (ref 0–35)
AST: 18 U/L (ref 0–37)
Albumin: 4.4 g/dL (ref 3.5–5.2)
Alkaline Phosphatase: 48 U/L (ref 39–117)
BUN: 12 mg/dL (ref 6–23)
CO2: 29 mEq/L (ref 19–32)
Calcium: 9.3 mg/dL (ref 8.4–10.5)
Chloride: 106 mEq/L (ref 96–112)
Creatinine, Ser: 0.77 mg/dL (ref 0.40–1.20)
GFR: 82.69 mL/min (ref >60.00)
Glucose, Bld: 81 mg/dL (ref 70–99)
Potassium: 3.8 mEq/L (ref 3.5–5.1)
Sodium: 142 mEq/L (ref 135–145)
Total Bilirubin: 0.5 mg/dL (ref 0.2–1.2)
Total Protein: 7.2 g/dL (ref 6.0–8.3)

## 2021-12-16 LAB — AMYLASE: Amylase: 28 U/L (ref 27–131)

## 2021-12-16 LAB — LIPASE: Lipase: 29 U/L (ref 11.0–59.0)

## 2021-12-16 MED ORDER — AMLODIPINE BESYLATE 5 MG PO TABS: ORAL_TABLET | ORAL | 1 refills | Status: DC

## 2021-12-16 MED ORDER — VALSARTAN-HYDROCHLOROTHIAZIDE 320-25 MG PO TABS: 1.0000 | ORAL_TABLET | Freq: Every day | ORAL | 1 refills | Status: DC

## 2021-12-16 NOTE — Assessment & Plan Note (Signed)
Acute ?Has had left lower quadrant abdominal pain over the past couple of weeks, started with vomiting after eating last week ?Denies nausea ?Vomiting has improved by eating less and different foods ?CBC, CMP, amylase, lipase, CT abdomen pelvis ?

## 2021-12-16 NOTE — Assessment & Plan Note (Signed)
Acute ?Started 2 weeks ago ?Intermittent pain left lower quadrant ?Last week she started having vomiting after eating, which has improved after changing her eating-eating much lighter and less ?No change in bowels, fever, urinary symptoms ??  Diverticulitis-has known diverticulosis ??  Hernia ?Given location less likely cholelithiasis ?CT abdomen and pelvis ordered ?CBC, CMP, amylase, lipase ?

## 2021-12-16 NOTE — Assessment & Plan Note (Addendum)
Chronic ?Blood pressure ok today but likely not controlled w/o medications ?CMP ?Restart amlodipine 5 mg daily, Diovan-HCTZ 320-25 mg daily ?

## 2021-12-18 ENCOUNTER — Ambulatory Visit (INDEPENDENT_AMBULATORY_CARE_PROVIDER_SITE_OTHER)
Admission: RE | Admit: 2021-12-18 | Discharge: 2021-12-18 | Disposition: A | Discharge status: Home / Self Care | Payer: BC Managed Care – PPO | Source: Ambulatory Visit | Attending: Internal Medicine | Admitting: Internal Medicine

## 2021-12-18 DIAGNOSIS — K5730 Diverticulosis of large intestine without perforation or abscess without bleeding: Secondary | ICD-10-CM | POA: Diagnosis not present

## 2021-12-18 DIAGNOSIS — R112 Nausea with vomiting, unspecified: Secondary | ICD-10-CM

## 2021-12-18 DIAGNOSIS — R1032 Left lower quadrant pain: Secondary | ICD-10-CM | POA: Diagnosis not present

## 2021-12-18 MED ORDER — IOHEXOL 300 MG/ML  SOLN
100.0000 mL | Freq: Once | INTRAMUSCULAR | Status: AC | PRN
  Administered 2021-12-18: 100 mL via INTRAVENOUS

## 2022-03-13 ENCOUNTER — Other Ambulatory Visit: Payer: Self-pay | Admitting: Internal Medicine

## 2022-03-15 ENCOUNTER — Emergency Department (HOSPITAL_BASED_OUTPATIENT_CLINIC_OR_DEPARTMENT_OTHER)
Admission: EM | Admit: 2022-03-15 | Discharge: 2022-03-15 | Disposition: A | Discharge status: Home / Self Care | Payer: BC Managed Care – PPO | Attending: Emergency Medicine | Admitting: Emergency Medicine

## 2022-03-15 ENCOUNTER — Other Ambulatory Visit: Payer: Self-pay

## 2022-03-15 ENCOUNTER — Encounter (HOSPITAL_BASED_OUTPATIENT_CLINIC_OR_DEPARTMENT_OTHER): Payer: Self-pay

## 2022-03-15 ENCOUNTER — Emergency Department (HOSPITAL_BASED_OUTPATIENT_CLINIC_OR_DEPARTMENT_OTHER): Payer: BC Managed Care – PPO

## 2022-03-15 DIAGNOSIS — Z87891 Personal history of nicotine dependence: Secondary | ICD-10-CM | POA: Diagnosis not present

## 2022-03-15 DIAGNOSIS — Z79899 Other long term (current) drug therapy: Secondary | ICD-10-CM | POA: Diagnosis not present

## 2022-03-15 DIAGNOSIS — S92901A Unspecified fracture of right foot, initial encounter for closed fracture: Secondary | ICD-10-CM | POA: Diagnosis not present

## 2022-03-15 DIAGNOSIS — X58XXXA Exposure to other specified factors, initial encounter: Secondary | ICD-10-CM | POA: Insufficient documentation

## 2022-03-15 DIAGNOSIS — M79672 Pain in left foot: Secondary | ICD-10-CM | POA: Diagnosis not present

## 2022-03-15 DIAGNOSIS — S99922A Unspecified injury of left foot, initial encounter: Secondary | ICD-10-CM | POA: Diagnosis not present

## 2022-03-15 DIAGNOSIS — I10 Essential (primary) hypertension: Secondary | ICD-10-CM | POA: Diagnosis not present

## 2022-03-15 DIAGNOSIS — S92811A Other fracture of right foot, initial encounter for closed fracture: Secondary | ICD-10-CM

## 2022-03-15 DIAGNOSIS — M7989 Other specified soft tissue disorders: Secondary | ICD-10-CM | POA: Diagnosis not present

## 2022-03-15 DIAGNOSIS — S92812A Other fracture of left foot, initial encounter for closed fracture: Secondary | ICD-10-CM | POA: Diagnosis not present

## 2022-03-15 MED ORDER — ONDANSETRON HCL 4 MG PO TABS: 4.0000 mg | ORAL_TABLET | Freq: Once | ORAL | Status: DC

## 2022-03-15 MED ORDER — ONDANSETRON 4 MG PO TBDP
4.0000 mg | ORAL_TABLET | Freq: Once | ORAL | Status: AC
  Administered 2022-03-15: 4 mg via ORAL
  Filled 2022-03-15: qty 1

## 2022-03-15 MED ORDER — IBUPROFEN 600 MG PO TABS: 600.0000 mg | ORAL_TABLET | Freq: Four times a day (QID) | ORAL | 0 refills | Status: DC | PRN

## 2022-03-15 MED ORDER — HYDROMORPHONE HCL 2 MG PO TABS: 1.0000 mg | ORAL_TABLET | Freq: Once | ORAL | Status: DC

## 2022-03-15 MED ORDER — HYDROMORPHONE HCL 1 MG/ML IJ SOLN
1.0000 mg | Freq: Once | INTRAMUSCULAR | Status: AC
  Administered 2022-03-15: 1 mg via INTRAMUSCULAR
  Filled 2022-03-15: qty 1

## 2022-03-15 NOTE — Discharge Instructions (Addendum)
It was a pleasure caring for you today in the emergency department. ° °Please return to the emergency department for any worsening or worrisome symptoms. ° ° °

## 2022-03-15 NOTE — ED Provider Notes (Signed)
[facility]PART[facility] Provider Note   CSN: [id number] Arrival date & time: 03/15/22  0272     History  Chief Complaint  Patient presents with   Foot Pain    [name] is a 61 y.o. female.  Patient as above with significant medical history as below, including HTN, occ migraines who presents to the ED with complaint of left foot pain. No fall or acute injury reported, pain to left hallux. Mild swelling, no numbness or tingling.  Patient reports yesterday she walked more than normal at her job, last night she began having pain and swelling to her left hallux.  No nail damage.  No history of gout.  No trauma.  No medications prior to arrival.  She is able to bear weight but is painful to the left hallux     Past Medical History:  Diagnosis Date   Allergy    seasonal, environmental   Environmental allergies 02-27-13   OTC meds daily   Headache(784.0) 02-27-13   occ. migraines   HTN (hypertension)    Transfusion history    childhood s/p Tonsillectomy    Past Surgical History:  Procedure Laterality Date   ABDOMINAL HYSTERECTOMY     BLADDER REPAIR     BREAST REDUCTION SURGERY  03/16/2016   COLONOSCOPY     CYSTOSTOMY N/A 03/02/2013   Procedure: CYSTOSTOMY CLOSURE ;  Surgeon: Alexis Frock, MD;  Location: [facility];  Service: Urology;  Laterality: N/A;   INCISIONAL HERNIA REPAIR N/A 03/02/2013   Procedure: LAPAROSCOPIC INCISIONAL HERNIA CONVERTED TO OPEN ;  Surgeon: Harl Bowie, MD;  Location: [facility];  Service: General;  Laterality: N/A;   INGUINAL HERNIA REPAIR     x's 2    KNEE SURGERY Left    torn meniscus - arthroscopic repair   NECK SURGERY     Disc replaced with a steel plate   TONSILLECTOMY     childhoood   TOTAL ABDOMINAL HYSTERECTOMY W/ BILATERAL SALPINGOOPHORECTOMY     prolapsed uterus at 23-41 years old, ovaries and tubes removed 10 years later due to cysts     The history is provided by the patient. No language interpreter was used.   Foot Pain Pertinent negatives include no chest pain, no abdominal pain, no headaches and no shortness of breath.       Home Medications Prior to Admission medications   Medication Sig Start Date End Date Taking? Authorizing Provider  ibuprofen (ADVIL) 600 MG tablet Take 1 tablet (600 mg total) by mouth every 6 (six) hours as needed. 03/15/22  Yes Wynona Dove A, DO  amLODipine (NORVASC) 5 MG tablet TAKE 1 TABLET(5 MG) BY MOUTH DAILY 12/16/21   Binnie Rail, MD  Calcium Citrate-Vitamin D (CALCIUM CITRATE + D3) 200-250 MG-UNIT TABS Take 1 capsule by mouth daily.    [provider]  potassium gluconate 595 (99 K) MG TABS tablet Take 595 mg by mouth daily.    [provider]  valsartan-hydrochlorothiazide (DIOVAN-HCT) 320-25 MG tablet Take 1 tablet by mouth daily. 12/16/21   Binnie Rail, MD  vitamin B-12 (CYANOCOBALAMIN) 500 MCG tablet Take 500 mcg by mouth daily.    [provider]      Allergies    Fish allergy and Percocet [oxycodone-acetaminophen]    Review of Systems   Review of Systems  Constitutional:  Negative for activity change and fever.  HENT:  Negative for facial swelling and trouble swallowing.   Eyes:  Negative for discharge  and redness.  Respiratory:  Negative for cough and shortness of breath.   Cardiovascular:  Negative for chest pain and palpitations.  Gastrointestinal:  Negative for abdominal pain and nausea.  Genitourinary:  Negative for dysuria and flank pain.  Musculoskeletal:  Positive for arthralgias. Negative for back pain and gait problem.  Skin:  Negative for pallor and rash.  Neurological:  Negative for syncope and headaches.    Physical Exam Updated Vital Signs BP (!) 140/107 (BP Location: Right Arm)   Pulse 93   Temp 98.5 F (36.9 C) (Oral)   Resp 16   Ht '5\' 4"'$  (1.626 m)   Wt 86.2 kg   SpO2 98%   BMI 32.61 kg/m  Physical Exam Vitals and nursing note reviewed.  Constitutional:      General: She is not in acute  distress.    Appearance: Normal appearance.  HENT:     Head: Normocephalic and atraumatic.     Right Ear: External ear normal.     Left Ear: External ear normal.     Nose: Nose normal.     Mouth/Throat:     Mouth: Mucous membranes are moist.  Eyes:     General: No scleral icterus.       Right eye: No discharge.        Left eye: No discharge.  Cardiovascular:     Rate and Rhythm: Normal rate and regular rhythm.     Pulses: Normal pulses.     Heart sounds: Normal heart sounds.  Pulmonary:     Effort: Pulmonary effort is normal. No respiratory distress.     Breath sounds: Normal breath sounds.  Abdominal:     General: Abdomen is flat.     Tenderness: There is no abdominal tenderness.  Musculoskeletal:        General: Normal range of motion.     Cervical back: Normal range of motion.     Right lower leg: No edema.     Left lower leg: No edema.       Feet:  Feet:     Comments: Mild reduced range of motion to left hallux secondary to discomfort.  Capillary fill is brisk.  Nailbed intact.  DP pulse 2+ bilateral.  No pain or swelling to ankle or ipsilateral knee.  No external evidence of trauma Skin:    General: Skin is warm and dry.     Capillary Refill: Capillary refill takes less than 2 seconds.  Neurological:     Mental Status: She is alert.  Psychiatric:        Mood and Affect: Mood normal.        Behavior: Behavior normal.     ED Results / Procedures / Treatments   Labs (all labs ordered are listed, but only abnormal results are displayed) Labs Reviewed - No data to display  EKG None  Radiology DG Foot Complete Left  Addendum Date: 03/15/2022   ADDENDUM REPORT: 03/15/2022 10:25 ADDENDUM: Prominent degenerative changes about the first MTP joint also noted. Electronically Signed   By: Marcello Moores  Register M.D.   On: 03/15/2022 10:25   Result Date: 03/15/2022 CLINICAL DATA:  Pain and swelling. EXAM: LEFT FOOT - COMPLETE 3+ VIEW COMPARISON:  No recent prior. FINDINGS:  Soft tissue swelling is noted adjacent to the left first MTP joint. Fragmentation of the medial sesamoid of the left great toe noted. A medial sesamoid fracture, age undetermined, could present in this fashion. Mild calcaneal spurring. No other focal bony abnormalities identified.  No radiopaque foreign body. IMPRESSION: Soft tissue swelling noted adjacent to the left first MTP joint. Fragmentation of the medial sesamoid left great toe noted. A medial sesamoid fracture, age undetermined could present this fashion. Electronically Signed: By: Marcello Moores  Register M.D. On: 03/15/2022 10:17    Procedures Procedures    Medications Ordered in ED Medications  ondansetron (ZOFRAN-ODT) disintegrating tablet 4 mg (has no administration in time range)  HYDROmorphone (DILAUDID) injection 1 mg (has no administration in time range)    ED Course/ Medical Decision Making/ A&P                           Medical Decision Making Amount and/or Complexity of Data Reviewed Radiology: ordered.  Risk Prescription drug management.    CC: Foot pain  This patient presents to the Emergency Department for the above complaint. This involves an extensive number of treatment options and is a complaint that carries with it a high risk of complications and morbidity. Vital signs were reviewed. Serious etiologies considered.  DDx includes not limited to sprain, strain, MSK, fracture, soft tissue, cellulitis, infectious, metabolic, gout, other acute etiologies were considered. Record review:  Previous records obtained and reviewed prior ED visits, prior labs and imaging  Additional history obtained from N/A  Medical and surgical history as noted above.   Work up as above, notable for:  Labs & imaging results that were available during my care of the patient were visualized by me and considered in my medical decision making.  Physical exam as above.   I ordered imaging studies which included foot complete left. I  visualized the imaging, interpreted images, and I agree with radiologist interpretation.  Age-indeterminate sesamoid fracture  Management: Parenteral analgesics, antiemetic  ED Course:     Reassessment:  Patient reports if improvement to her discomfort following analgesic.  Patient with ? age-indeterminate sesamoid fracture to the left hallux.  Placed in postop shoe.  Given Ortho follow-up.   Admission was considered.      The patient improved significantly and was discharged in stable condition. Detailed discussions were had with the patient regarding current findings, and need for close f/u with PCP or on call doctor. The patient has been instructed to return immediately if the symptoms worsen in any way for re-evaluation. Patient verbalized understanding and is in agreement with current care plan. All questions answered prior to discharge.            Social determinants of health include -  Social History   Socioeconomic History   Marital status: Single    Spouse name: Not on file   Number of children: 3   Years of education: Not on file   Highest education level: Not on file  Occupational History   Occupation: PROGRAM COODINATIOR    Employer: [employer]  Tobacco Use   Smoking status: Former    Types: Cigarettes    Quit date: 09/06/2005    Years since quitting: 16.5   Smokeless tobacco: Never  Vaping Use   Vaping Use: Never used  Substance and Sexual Activity   Alcohol use: Yes    Alcohol/week: 0.0 standard drinks of alcohol    Comment: Rarely   Drug use: No   Sexual activity: Yes  Other Topics Concern   Not on file  Social History Narrative   No regular exercise right now   Social Determinants of Health   Financial Resource Strain: Low Risk  (03/16/2021)  Overall Financial Resource Strain (CARDIA)    Difficulty of Paying Living Expenses: Not hard at all  Food Insecurity: No Food Insecurity (03/16/2021)   Hunger Vital Sign    Worried About Running  Out of Food in the Last Year: Never true    Ran Out of Food in the Last Year: Never true  Transportation Needs: No Transportation Needs (03/16/2021)   PRAPARE - Hydrologist (Medical): No    Lack of Transportation (Non-Medical): No  Physical Activity: Inactive (03/16/2021)   Exercise Vital Sign    Days of Exercise per Week: 0 days    Minutes of Exercise per Session: 0 min  Stress: No Stress Concern Present (03/16/2021)   [place]    Feeling of Stress : Not at all  Social Connections: Moderately Integrated (03/16/2021)   Social Connection and Isolation Panel [NHANES]    Frequency of Communication with Friends and Family: Twice a week    Frequency of Social Gatherings with Friends and Family: Twice a week    Attends Religious Services: 1 to 4 times per year    Active Member of [employer] or Organizations: No    Attends Archivist Meetings: 1 to 4 times per year    Marital Status: Widowed  Intimate Partner Violence: Not At Risk (03/16/2021)   Humiliation, Afraid, Rape, and Kick questionnaire    Fear of Current or Ex-Partner: No    Emotionally Abused: No    Physically Abused: No    Sexually Abused: No      This chart was dictated using voice recognition software.  Despite best efforts to proofread,  errors can occur which can change the documentation meaning.         Final Clinical Impression(s) / ED Diagnoses Final diagnoses:  Closed fracture of sesamoid bone of right foot, initial encounter    Rx / DC Orders ED Discharge Orders          Ordered    ibuprofen (ADVIL) 600 MG tablet  Every 6 hours PRN        03/15/22 1041              Wynona Dove A, DO 03/15/22 1057

## 2022-03-15 NOTE — ED Notes (Signed)
States did not take antihypertensives today.

## 2022-03-15 NOTE — ED Triage Notes (Signed)
C/o left foot pain x 1 day. Denies known injury.

## 2022-03-17 ENCOUNTER — Other Ambulatory Visit: Payer: Self-pay | Admitting: Family Medicine

## 2022-03-17 ENCOUNTER — Encounter: Payer: Self-pay | Admitting: Family Medicine

## 2022-03-17 ENCOUNTER — Ambulatory Visit (INDEPENDENT_AMBULATORY_CARE_PROVIDER_SITE_OTHER): Payer: BC Managed Care – PPO | Admitting: Family Medicine

## 2022-03-17 VITALS — BP 132/84 | HR 95 | Temp 97.6°F | Ht 64.0 in | Wt 194.0 lb

## 2022-03-17 DIAGNOSIS — E559 Vitamin D deficiency, unspecified: Secondary | ICD-10-CM | POA: Diagnosis not present

## 2022-03-17 DIAGNOSIS — E2839 Other primary ovarian failure: Secondary | ICD-10-CM | POA: Diagnosis not present

## 2022-03-17 DIAGNOSIS — S92902A Unspecified fracture of left foot, initial encounter for closed fracture: Secondary | ICD-10-CM | POA: Diagnosis not present

## 2022-03-17 LAB — VITAMIN D 25 HYDROXY (VIT D DEFICIENCY, FRACTURES): VITD: 23.28 ng/mL — ABNORMAL LOW (ref 30.00–100.00)

## 2022-03-17 MED ORDER — VITAMIN D (ERGOCALCIFEROL) 1.25 MG (50000 UNIT) PO CAPS: 50000.0000 [IU] | ORAL_CAPSULE | ORAL | 0 refills | Status: DC

## 2022-03-17 NOTE — Patient Instructions (Signed)
  Please go downstairs for vitamin D lab draw.   Call and schedule your bone density at [facility] at your convenience.   Follow up with your OB/GYN and Dr. Quay Burow.

## 2022-03-17 NOTE — Assessment & Plan Note (Signed)
She is not taking a supplement. Check D level

## 2022-03-17 NOTE — Assessment & Plan Note (Signed)
Nontraumatic closed fracture of sesamoid bone of left foot. She will see Dr. Erlinda Hong next week. Pain is under control. Bone density 6 years ago per patient was normal. I will order bone density at [facility] and she will call to schedule.  Check vitamin D level.  Reviewed recent labs including normal CMP.

## 2022-03-17 NOTE — Assessment & Plan Note (Signed)
DEXA ordered.  

## 2022-03-17 NOTE — Progress Notes (Signed)
Subjective:     Patient ID: Helen Young, female    DOB: 09/09/1960, 61 y.o.   MRN: [id number]  Chief Complaint  Patient presents with   broken foot    States woke up on Sunday with her left foot aching, was not sure why and then Monday morning woke up and it was swollen. Went to hospital and they told her it was broken and she needs to f/u with primary care and [facility], pt has scheduled both.     HPI Patient is in today for follow up on closed fracture of her left foot per ED recommendation. States she woke up with left foot pain and swelling 4 days ago. No injury. Unclear as to how she could have a sesamoid fracture. She has an appt with Dr. Erlinda Hong next week.    She is wearing a orthopedic shoe on her left foot. No numbness, tingling or increased pain.   Total hysterectomy at age 46 for benign issues.   She took estrogen until age 89 and states her gynecologist stopped the HRT.  Sees Dr. Garwin Brothers now.  Taking estroven OTC.   Vitamin D def- is not taking D or calcium regularly. Thinks she gets a fair amount of calcium in her diet.   Bone density done in [place] 6 years ago and per patient.   Denies fever, chills, dizziness, chest pain, palpitations, shortness of breath, abdominal pain, N/V/D.    Health Maintenance Due  Topic Date Due   Diabetic kidney evaluation - Urine ACR  Never done   Zoster Vaccines- Shingrix (1 of 2) Never done   MAMMOGRAM  12/05/2013   TETANUS/TDAP  06/06/2021    Past Medical History:  Diagnosis Date   Allergy    seasonal, environmental   Environmental allergies 02-27-13   OTC meds daily   Headache(784.0) 02-27-13   occ. migraines   HTN (hypertension)    Transfusion history    childhood s/p Tonsillectomy    Past Surgical History:  Procedure Laterality Date   ABDOMINAL HYSTERECTOMY     BLADDER REPAIR     BREAST REDUCTION SURGERY  03/16/2016   COLONOSCOPY     CYSTOSTOMY N/A 03/02/2013   Procedure: CYSTOSTOMY CLOSURE ;  Surgeon: Alexis Frock, MD;  Location: [facility];  Service: Urology;  Laterality: N/A;   INCISIONAL HERNIA REPAIR N/A 03/02/2013   Procedure: LAPAROSCOPIC INCISIONAL HERNIA CONVERTED TO OPEN ;  Surgeon: Harl Bowie, MD;  Location: [facility];  Service: General;  Laterality: N/A;   INGUINAL HERNIA REPAIR     x's 2    KNEE SURGERY Left    torn meniscus - arthroscopic repair   NECK SURGERY     Disc replaced with a steel plate   TONSILLECTOMY     childhoood   TOTAL ABDOMINAL HYSTERECTOMY W/ BILATERAL SALPINGOOPHORECTOMY     prolapsed uterus at 79-76 years old, ovaries and tubes removed 10 years later due to cysts    Family History  Problem Relation Age of Onset   Colon cancer Father    Prostate cancer Father    Heart disease Father    Hypertension Father    Colon polyps Father    Heart failure Father    Heart disease Mother    Stroke Mother    Hypertension Mother    Kidney disease Maternal Grandfather    Diabetes Maternal Grandfather    Sudden death Paternal Grandfather    Sudden death Paternal Uncle  Cardiac issues   Heart failure Paternal Uncle    Heart failure Cousin    Esophageal cancer Neg Hx    Rectal cancer Neg Hx    Stomach cancer Neg Hx     Social History   Socioeconomic History   Marital status: Single    Spouse name: Not on file   Number of children: 3   Years of education: Not on file   Highest education level: Not on file  Occupational History   Occupation: PROGRAM COODINATIOR    Employer: [employer]  Tobacco Use   Smoking status: Former    Types: Cigarettes    Quit date: 09/06/2005    Years since quitting: 16.5   Smokeless tobacco: Never  Vaping Use   Vaping Use: Never used  Substance and Sexual Activity   Alcohol use: Yes    Alcohol/week: 0.0 standard drinks of alcohol    Comment: Rarely   Drug use: No   Sexual activity: Yes  Other Topics Concern   Not on file  Social History Narrative   No regular exercise right now   Social Determinants of Health    Financial Resource Strain: Low Risk  (03/16/2021)   Overall Financial Resource Strain (CARDIA)    Difficulty of Paying Living Expenses: Not hard at all  Food Insecurity: No Food Insecurity (03/16/2021)   Hunger Vital Sign    Worried About Running Out of Food in the Last Year: Never true    Ran Out of Food in the Last Year: Never true  Transportation Needs: No Transportation Needs (03/16/2021)   PRAPARE - Hydrologist (Medical): No    Lack of Transportation (Non-Medical): No  Physical Activity: Inactive (03/16/2021)   Exercise Vital Sign    Days of Exercise per Week: 0 days    Minutes of Exercise per Session: 0 min  Stress: No Stress Concern Present (03/16/2021)   [place]    Feeling of Stress : Not at all  Social Connections: Moderately Integrated (03/16/2021)   Social Connection and Isolation Panel [NHANES]    Frequency of Communication with Friends and Family: Twice a week    Frequency of Social Gatherings with Friends and Family: Twice a week    Attends Religious Services: 1 to 4 times per year    Active Member of [employer] or Organizations: No    Attends Archivist Meetings: 1 to 4 times per year    Marital Status: Widowed  Intimate Partner Violence: Not At Risk (03/16/2021)   Humiliation, Afraid, Rape, and Kick questionnaire    Fear of Current or Ex-Partner: No    Emotionally Abused: No    Physically Abused: No    Sexually Abused: No    Outpatient Medications Prior to Visit  Medication Sig Dispense Refill   amLODipine (NORVASC) 5 MG tablet TAKE 1 TABLET(5 MG) BY MOUTH DAILY 90 tablet 1   Calcium Citrate-Vitamin D (CALCIUM CITRATE + D3) 200-250 MG-UNIT TABS Take 1 capsule by mouth daily.     ibuprofen (ADVIL) 600 MG tablet Take 1 tablet (600 mg total) by mouth every 6 (six) hours as needed. 30 tablet 0   potassium gluconate 595 (99 K) MG TABS tablet Take 595 mg by mouth daily.      valsartan-hydrochlorothiazide (DIOVAN-HCT) 320-25 MG tablet Take 1 tablet by mouth daily. 90 tablet 1   vitamin B-12 (CYANOCOBALAMIN) 500 MCG tablet Take 500 mcg by mouth daily.  No facility-administered medications prior to visit.    Allergies  Allergen Reactions   Fish Allergy Itching and Swelling   Percocet [Oxycodone-Acetaminophen] Swelling    ROS Pertinent positives and negatives in the history of present illness.     Objective:    Physical Exam Constitutional:      General: She is not in acute distress.    Appearance: She is not ill-appearing.  Cardiovascular:     Rate and Rhythm: Normal rate.  Pulmonary:     Effort: Pulmonary effort is normal.  Neurological:     Mental Status: She is alert and oriented to person, place, and time.     Gait: Gait abnormal.  Psychiatric:        Mood and Affect: Mood normal.     BP 132/84 (BP Location: Left Arm, Patient Position: Sitting, Cuff Size: Large)   Pulse 95   Temp 97.6 F (36.4 C) (Temporal)   Ht '5\' 4"'$  (1.626 m)   Wt 194 lb (88 kg) Comment: with boot  SpO2 98%   BMI 33.30 kg/m  Wt Readings from Last 3 Encounters:  03/17/22 194 lb (88 kg)  03/15/22 190 lb (86.2 kg)  12/16/21 191 lb (86.6 kg)       Assessment & Plan:   Problem List Items Addressed This Visit       Musculoskeletal and Integument   Fracture, foot, left, closed, initial encounter - Primary    Nontraumatic closed fracture of sesamoid bone of left foot. She will see Dr. Erlinda Hong next week. Pain is under control. Bone density 6 years ago per patient was normal. I will order bone density at [facility] and she will call to schedule.  Check vitamin D level.  Reviewed recent labs including normal CMP.       Relevant Orders   DG Bone Density     Other   Estrogen deficiency    DEXA ordered      Relevant Orders   DG Bone Density   Vitamin D deficiency    She is not taking a supplement. Check D level      Relevant Orders   VITAMIN D  25 Hydroxy (Vit-D Deficiency, Fractures) (Completed)    I am having Emogene Morgan maintain her potassium gluconate, Calcium Citrate + D3, vitamin B-12, amLODipine, valsartan-hydrochlorothiazide, and ibuprofen.  No orders of the defined types were placed in this encounter.

## 2022-03-23 ENCOUNTER — Telehealth: Payer: Self-pay | Admitting: Orthopaedic Surgery

## 2022-03-23 ENCOUNTER — Ambulatory Visit (INDEPENDENT_AMBULATORY_CARE_PROVIDER_SITE_OTHER): Payer: BC Managed Care – PPO | Admitting: Orthopaedic Surgery

## 2022-03-23 ENCOUNTER — Encounter: Payer: Self-pay | Admitting: Orthopaedic Surgery

## 2022-03-23 DIAGNOSIS — M79675 Pain in left toe(s): Secondary | ICD-10-CM | POA: Insufficient documentation

## 2022-03-23 NOTE — Telephone Encounter (Signed)
Spoke with patient. Imaging order has been corrected.

## 2022-03-23 NOTE — Progress Notes (Signed)
Office Visit Note   Patient: Helen Young           Date of Birth: 01-07-1961           MRN: [id number] Visit Date: 03/23/2022              Requested by: Binnie Rail, MD [place],  [place] [id number] PCP: Binnie Rail, MD   Assessment & Plan: Visit Diagnoses:  1. Great toe pain, left     Plan: Impression is left foot sesamoid pain.  Question degenerative changes versus acute injury.  Will obtain CT scan to further characterize.  I will contact the patient through MyChart with the results and further steps.  For now she will use postop shoe for ambulation and weightbearing.  Follow-Up Instructions: No follow-ups on file.   Orders:  No orders of the defined types were placed in this encounter.  No orders of the defined types were placed in this encounter.     Procedures: No procedures performed   Clinical Data: No additional findings.   Subjective: Chief Complaint  Patient presents with   Left Foot - Fracture    HPI  Helen Young is a very pleasant 61 year old female referral from the ED from 03/15/2022.  Started having progressive pain and soreness and achiness on the bottom of the great toe without specific injury or trauma.  Currently walking with a postop shoe.  Denies history of gout.  Review of Systems  Constitutional: Negative.   HENT: Negative.    Eyes: Negative.   Respiratory: Negative.    Cardiovascular: Negative.   Endocrine: Negative.   Musculoskeletal: Negative.   Neurological: Negative.   Hematological: Negative.   Psychiatric/Behavioral: Negative.    All other systems reviewed and are negative.    Objective: Vital Signs: There were no vitals taken for this visit.  Physical Exam Vitals and nursing note reviewed.  Constitutional:      Appearance: She is well-developed.  HENT:     Head: Atraumatic.     Nose: Nose normal.  Eyes:     Extraocular Movements: Extraocular movements intact.  Cardiovascular:     Pulses: Normal  pulses.  Pulmonary:     Effort: Pulmonary effort is normal.  Abdominal:     Palpations: Abdomen is soft.  Musculoskeletal:     Cervical back: Neck supple.  Skin:    General: Skin is warm.     Capillary Refill: Capillary refill takes less than 2 seconds.  Neurological:     Mental Status: She is alert. Mental status is at baseline.  Psychiatric:        Behavior: Behavior normal.        Thought Content: Thought content normal.        Judgment: Judgment normal.     Ortho Exam Examination of the left great toe and foot shows shows no significant swelling.  No skin lesions masses or rashes.  Slight tenderness to the sesamoids.  Passive range of motion is well-tolerated without significant discomfort.  Mild bunion deformity. Specialty Comments:  No specialty comments available.  Imaging: No results found.   PMFS History: Patient Active Problem List   Diagnosis Date Noted   Great toe pain, left 03/23/2022   Fracture, foot, left, closed, initial encounter 03/17/2022   Estrogen deficiency 03/17/2022   Nausea and vomiting 12/16/2021   Neck pain 12/06/2020   Urinary urgency 07/29/2019   Left lower quadrant abdominal pain 03/21/2019   Diverticulosis 03/21/2019  Fatty liver 03/21/2019   Maxillary sinus polyp 11/06/2018   Lower back pain 08/23/2018   Right rotator cuff tear 05/19/2017   Vitamin D deficiency 12/01/2016   Sleep difficulties 08/11/2016   Numbness in left leg 07/16/2015   Back pain 12/06/2014   Obesity (BMI 30-39.9) 10/15/2013   Recurrent ventral incisional hernia 02/09/2013   Thoracic back pain 10/09/2012   HTN (hypertension) 04/06/2012   Scoliosis 04/06/2012   Environmental allergies 04/06/2012   Past Medical History:  Diagnosis Date   Allergy    seasonal, environmental   Environmental allergies 02-27-13   OTC meds daily   Headache(784.0) 02-27-13   occ. migraines   HTN (hypertension)    Transfusion history    childhood s/p Tonsillectomy    Family  History  Problem Relation Age of Onset   Colon cancer Father    Prostate cancer Father    Heart disease Father    Hypertension Father    Colon polyps Father    Heart failure Father    Heart disease Mother    Stroke Mother    Hypertension Mother    Kidney disease Maternal Grandfather    Diabetes Maternal Grandfather    Sudden death Paternal Grandfather    Sudden death Paternal Uncle        Cardiac issues   Heart failure Paternal Uncle    Heart failure Cousin    Esophageal cancer Neg Hx    Rectal cancer Neg Hx    Stomach cancer Neg Hx     Past Surgical History:  Procedure Laterality Date   ABDOMINAL HYSTERECTOMY     BLADDER REPAIR     BREAST REDUCTION SURGERY  03/16/2016   COLONOSCOPY     CYSTOSTOMY N/A 03/02/2013   Procedure: CYSTOSTOMY CLOSURE ;  Surgeon: Alexis Frock, MD;  Location: [facility];  Service: Urology;  Laterality: N/A;   INCISIONAL HERNIA REPAIR N/A 03/02/2013   Procedure: LAPAROSCOPIC INCISIONAL HERNIA CONVERTED TO OPEN ;  Surgeon: Harl Bowie, MD;  Location: [facility];  Service: General;  Laterality: N/A;   INGUINAL HERNIA REPAIR     x's 2    KNEE SURGERY Left    torn meniscus - arthroscopic repair   NECK SURGERY     Disc replaced with a steel plate   TONSILLECTOMY     childhoood   TOTAL ABDOMINAL HYSTERECTOMY W/ BILATERAL SALPINGOOPHORECTOMY     prolapsed uterus at 3-50 years old, ovaries and tubes removed 10 years later due to cysts   Social History   Occupational History   Occupation: PROGRAM Advertising account executive: [employer]  Tobacco Use   Smoking status: Former    Types: Cigarettes    Quit date: 09/06/2005    Years since quitting: 16.5   Smokeless tobacco: Never  Vaping Use   Vaping Use: Never used  Substance and Sexual Activity   Alcohol use: Yes    Alcohol/week: 0.0 standard drinks of alcohol    Comment: Rarely   Drug use: No   Sexual activity: Yes

## 2022-03-23 NOTE — Addendum Note (Signed)
Addended by: Lendon Collar on: 03/23/2022 03:38 PM   Modules accepted: Orders

## 2022-03-23 NOTE — Telephone Encounter (Signed)
Patient called stating that she was referred to get an MRI of her foot but the right foot is on the order and it needs to be changed to the left foot instead. CB # [phone]

## 2022-03-24 NOTE — Telephone Encounter (Signed)
Please make it urgent.  8/15 is too late.  Thanks.

## 2022-03-26 ENCOUNTER — Ambulatory Visit
Admission: RE | Admit: 2022-03-26 | Discharge: 2022-03-26 | Disposition: A | Discharge status: Home / Self Care | Payer: BC Managed Care – PPO | Source: Ambulatory Visit | Attending: Orthopaedic Surgery | Admitting: Orthopaedic Surgery

## 2022-03-26 DIAGNOSIS — M7989 Other specified soft tissue disorders: Secondary | ICD-10-CM | POA: Diagnosis not present

## 2022-03-26 DIAGNOSIS — M79675 Pain in left toe(s): Secondary | ICD-10-CM

## 2022-03-28 ENCOUNTER — Encounter: Payer: Self-pay | Admitting: Orthopaedic Surgery

## 2022-04-15 ENCOUNTER — Ambulatory Visit
Admission: RE | Admit: 2022-04-15 | Discharge: 2022-04-15 | Disposition: A | Discharge status: Home / Self Care | Payer: BC Managed Care – PPO | Source: Ambulatory Visit | Attending: Family Medicine | Admitting: Family Medicine

## 2022-04-15 ENCOUNTER — Encounter: Payer: Self-pay | Admitting: Internal Medicine

## 2022-04-15 DIAGNOSIS — E2839 Other primary ovarian failure: Secondary | ICD-10-CM

## 2022-04-15 DIAGNOSIS — S92902A Unspecified fracture of left foot, initial encounter for closed fracture: Secondary | ICD-10-CM

## 2022-04-15 DIAGNOSIS — Z780 Asymptomatic menopausal state: Secondary | ICD-10-CM | POA: Diagnosis not present

## 2022-04-15 DIAGNOSIS — M8589 Other specified disorders of bone density and structure, multiple sites: Secondary | ICD-10-CM | POA: Diagnosis not present

## 2022-04-15 NOTE — Patient Instructions (Addendum)
You had a tetanus and shingles vaccine today.    Blood work was ordered.     Medications changes include :   none    Return in about 6 months (around 10/17/2022) for hypertension.   Health Maintenance, Female Adopting a healthy lifestyle and getting preventive care are important in promoting health and wellness. Ask your health care provider about: The right schedule for you to have regular tests and exams. Things you can do on your own to prevent diseases and keep yourself healthy. What should I know about diet, weight, and exercise? Eat a healthy diet  Eat a diet that includes plenty of vegetables, fruits, low-fat dairy products, and lean protein. Do not eat a lot of foods that are high in solid fats, added sugars, or sodium. Maintain a healthy weight Body mass index (BMI) is used to identify weight problems. It estimates body fat based on height and weight. Your health care provider can help determine your BMI and help you achieve or maintain a healthy weight. Get regular exercise Get regular exercise. This is one of the most important things you can do for your health. Most adults should: Exercise for at least 150 minutes each week. The exercise should increase your heart rate and make you sweat (moderate-intensity exercise). Do strengthening exercises at least twice a week. This is in addition to the moderate-intensity exercise. Spend less time sitting. Even light physical activity can be beneficial. Watch cholesterol and blood lipids Have your blood tested for lipids and cholesterol at 63 years of age, then have this test every 5 years. Have your cholesterol levels checked more often if: Your lipid or cholesterol levels are high. You are older than 63 years of age. You are at high risk for heart disease. What should I know about cancer screening? Depending on your health history and family history, you may need to have cancer screening at various ages. This may include  screening for: Breast cancer. Cervical cancer. Colorectal cancer. Skin cancer. Lung cancer. What should I know about heart disease, diabetes, and high blood pressure? Blood pressure and heart disease High blood pressure causes heart disease and increases the risk of stroke. This is more likely to develop in people who have high blood pressure readings or are overweight. Have your blood pressure checked: Every 3-5 years if you are 85-15 years of age. Every year if you are 15 years old or older. Diabetes Have regular diabetes screenings. This checks your fasting blood sugar level. Have the screening done: Once every three years after age 25 if you are at a normal weight and have a low risk for diabetes. More often and at a younger age if you are overweight or have a high risk for diabetes. What should I know about preventing infection? Hepatitis B If you have a higher risk for hepatitis B, you should be screened for this virus. Talk with your health care provider to find out if you are at risk for hepatitis B infection. Hepatitis C Testing is recommended for: Everyone born from [birth year] through 1965. Anyone with known risk factors for hepatitis C. Sexually transmitted infections (STIs) Get screened for STIs, including gonorrhea and chlamydia, if: You are sexually active and are younger than 63 years of age. You are older than 63 years of age and your health care provider tells you that you are at risk for this type of infection. Your sexual activity has changed since you were last screened, and you are at increased risk  for chlamydia or gonorrhea. Ask your health care provider if you are at risk. Ask your health care provider about whether you are at high risk for HIV. Your health care provider may recommend a prescription medicine to help prevent HIV infection. If you choose to take medicine to prevent HIV, you should first get tested for HIV. You should then be tested every 3 months for as  long as you are taking the medicine. Pregnancy If you are about to stop having your period (premenopausal) and you may become pregnant, seek counseling before you get pregnant. Take 400 to 800 micrograms (mcg) of folic acid every day if you become pregnant. Ask for birth control (contraception) if you want to prevent pregnancy. Osteoporosis and menopause Osteoporosis is a disease in which the bones lose minerals and strength with aging. This can result in bone fractures. If you are 39 years old or older, or if you are at risk for osteoporosis and fractures, ask your health care provider if you should: Be screened for bone loss. Take a calcium or vitamin D supplement to lower your risk of fractures. Be given hormone replacement therapy (HRT) to treat symptoms of menopause. Follow these instructions at home: Alcohol use Do not drink alcohol if: Your health care provider tells you not to drink. You are pregnant, may be pregnant, or are planning to become pregnant. If you drink alcohol: Limit how much you have to: 0-1 drink a day. Know how much alcohol is in your drink. In the [place] one drink equals one 12 oz bottle of beer (355 mL), one 5 oz glass of wine (148 mL), or one 1 oz glass of hard liquor (44 mL). Lifestyle Do not use any products that contain nicotine or tobacco. These products include cigarettes, chewing tobacco, and vaping devices, such as e-cigarettes. If you need help quitting, ask your health care provider. Do not use street drugs. Do not share needles. Ask your health care provider for help if you need support or information about quitting drugs. General instructions Schedule regular health, dental, and eye exams. Stay current with your vaccines. Tell your health care provider if: You often feel depressed. You have ever been abused or do not feel safe at home. Summary Adopting a healthy lifestyle and getting preventive care are important in promoting health and  wellness. Follow your health care provider's instructions about healthy diet, exercising, and getting tested or screened for diseases. Follow your health care provider's instructions on monitoring your cholesterol and blood pressure. This information is not intended to replace advice given to you by your health care provider. Make sure you discuss any questions you have with your health care provider. Document Revised: 01/12/2021 Document Reviewed: 01/12/2021 Elsevier Patient Education  [place].

## 2022-04-15 NOTE — Progress Notes (Signed)
Subjective:    Patient ID: Helen Young, female    DOB: Apr 06, 1961, 61 y.o.   MRN: [id number]      HPI Helen Young is here for a Physical exam.   Last Ct scan noted mild cardiomegaly which she is concerned about.   Occ right shoulder pain - h/o of torn rotator cuff.    Ant shoulder more prominent.     Medications and allergies reviewed with patient and updated if appropriate.  Current Outpatient Medications on File Prior to Visit  Medication Sig Dispense Refill   amLODipine (NORVASC) 5 MG tablet TAKE 1 TABLET(5 MG) BY MOUTH DAILY 90 tablet 1   Calcium Citrate-Vitamin D (CALCIUM CITRATE + D3) 200-250 MG-UNIT TABS Take 1 capsule by mouth daily.     potassium gluconate 595 (99 K) MG TABS tablet Take 595 mg by mouth daily.     valsartan-hydrochlorothiazide (DIOVAN-HCT) 320-25 MG tablet Take 1 tablet by mouth daily. 90 tablet 1   vitamin B-12 (CYANOCOBALAMIN) 500 MCG tablet Take 500 mcg by mouth daily.     Vitamin D, Ergocalciferol, (DRISDOL) 1.25 MG (50000 UNIT) CAPS capsule Take 1 capsule (50,000 Units total) by mouth every 7 (seven) days. 8 capsule 0   No current facility-administered medications on file prior to visit.    Review of Systems  Constitutional:  Negative for fever.       Hot flashes  Eyes:  Negative for visual disturbance.  Respiratory:  Negative for cough, shortness of breath and wheezing.   Cardiovascular:  Negative for chest pain, palpitations and leg swelling.  Gastrointestinal:  Positive for anal bleeding (pink color when wiping) and constipation. Negative for abdominal pain, blood in stool, diarrhea and nausea.       No gerd  Genitourinary:  Negative for dysuria.  Musculoskeletal:  Positive for arthralgias (shoulder, left foot) and back pain (from scoliosis).  Skin:  Negative for rash.  Neurological:  Negative for light-headedness and headaches.  Psychiatric/Behavioral:  Negative for dysphoric mood. The patient is not nervous/anxious.        Objective:    Vitals:   04/16/22 1427  BP: 132/80  Pulse: 80  Temp: 98.3 F (36.8 C)  SpO2: 98%   Filed Weights   04/16/22 1427  Weight: 197 lb (89.4 kg)   Body mass index is 33.81 kg/m.  BP Readings from Last 3 Encounters:  04/16/22 132/80  03/17/22 132/84  03/15/22 (!) 140/107    Wt Readings from Last 3 Encounters:  04/16/22 197 lb (89.4 kg)  03/17/22 194 lb (88 kg)  03/15/22 190 lb (86.2 kg)       Physical Exam Constitutional: She appears well-developed and well-nourished. No distress.  HENT:  Head: Normocephalic and atraumatic.  Right Ear: External ear normal. Normal ear canal and TM Left Ear: External ear normal.  Normal ear canal and TM Mouth/Throat: Oropharynx is clear and moist.  Eyes: Conjunctivae normal.  Neck: Neck supple. No tracheal deviation present. No thyromegaly present.  No carotid bruit  Cardiovascular: Normal rate, regular rhythm and normal heart sounds.   No murmur heard.  No edema. Pulmonary/Chest: Effort normal and breath sounds normal. No respiratory distress. She has no wheezes. She has no rales.  Breast: deferred   Abdominal: Soft. She exhibits no distension. There is no tenderness.  Lymphadenopathy: She has no cervical adenopathy.  Skin: Skin is warm and dry. She is not diaphoretic.  Psychiatric: She has a normal mood and affect. Her behavior is normal.  Lab Results  Component Value Date   WBC 5.5 12/16/2021   HGB 12.4 12/16/2021   HCT 37.8 12/16/2021   PLT 350.0 12/16/2021   GLUCOSE 81 12/16/2021   CHOL 208 (H) 03/06/2019   TRIG 250.0 (H) 03/06/2019   HDL 55.50 03/06/2019   LDLDIRECT 118.0 03/06/2019   LDLCALC 129 (H) 12/01/2016   ALT 13 12/16/2021   AST 18 12/16/2021   NA 142 12/16/2021   K 3.8 12/16/2021   CL 106 12/16/2021   CREATININE 0.77 12/16/2021   BUN 12 12/16/2021   CO2 29 12/16/2021   TSH 1.020 10/16/2020         Assessment & Plan:   Physical exam: Screening blood work  ordered Exercise encouraged regular  exercise Weight would benefit from weight loss Substance abuse  none   Reviewed recommended immunizations.  Tetanus and shingles vaccine today   Health Maintenance  Topic Date Due   Diabetic kidney evaluation - Urine ACR  Never done   MAMMOGRAM  12/05/2013   COVID-19 Vaccine (3 - Pfizer series) 05/02/2022 (Originally 01/27/2020)   INFLUENZA VACCINE  12/05/2022 (Originally 04/06/2022)   Zoster Vaccines- Shingrix (2 of 2) 06/11/2022   Diabetic kidney evaluation - GFR measurement  12/17/2022   COLONOSCOPY (Pts 45-71yr Insurance coverage will need to be confirmed)  10/02/2024   DEXA SCAN  04/15/2025   TETANUS/TDAP  04/16/2032   Hepatitis C Screening  Completed   HIV Screening  Completed   HPV VACCINES  Aged Out          See Problem List for Assessment and Plan of chronic medical problems.

## 2022-04-16 ENCOUNTER — Ambulatory Visit (INDEPENDENT_AMBULATORY_CARE_PROVIDER_SITE_OTHER): Payer: BC Managed Care – PPO | Admitting: Internal Medicine

## 2022-04-16 ENCOUNTER — Encounter: Payer: Self-pay | Admitting: Internal Medicine

## 2022-04-16 VITALS — BP 132/80 | HR 80 | Temp 98.3°F | Ht 64.0 in | Wt 197.0 lb

## 2022-04-16 DIAGNOSIS — I10 Essential (primary) hypertension: Secondary | ICD-10-CM

## 2022-04-16 DIAGNOSIS — E669 Obesity, unspecified: Secondary | ICD-10-CM | POA: Diagnosis not present

## 2022-04-16 DIAGNOSIS — Z23 Encounter for immunization: Secondary | ICD-10-CM

## 2022-04-16 DIAGNOSIS — Z0000 Encounter for general adult medical examination without abnormal findings: Secondary | ICD-10-CM

## 2022-04-16 DIAGNOSIS — E559 Vitamin D deficiency, unspecified: Secondary | ICD-10-CM

## 2022-04-16 DIAGNOSIS — R944 Abnormal results of kidney function studies: Secondary | ICD-10-CM

## 2022-04-16 DIAGNOSIS — M8580 Other specified disorders of bone density and structure, unspecified site: Secondary | ICD-10-CM | POA: Insufficient documentation

## 2022-04-16 DIAGNOSIS — Z833 Family history of diabetes mellitus: Secondary | ICD-10-CM

## 2022-04-16 DIAGNOSIS — M8589 Other specified disorders of bone density and structure, multiple sites: Secondary | ICD-10-CM

## 2022-04-16 LAB — CBC WITH DIFFERENTIAL/PLATELET
Basophils Absolute: 0 10*3/uL (ref 0.0–0.1)
Basophils Relative: 0.6 % (ref 0.0–3.0)
Eosinophils Absolute: 0.2 10*3/uL (ref 0.0–0.7)
Eosinophils Relative: 3.4 % (ref 0.0–5.0)
HCT: 38.2 % (ref 36.0–46.0)
Hemoglobin: 12.5 g/dL (ref 12.0–15.0)
Lymphocytes Relative: 41.5 % (ref 12.0–46.0)
Lymphs Abs: 2.3 10*3/uL (ref 0.7–4.0)
MCHC: 32.8 g/dL (ref 30.0–36.0)
MCV: 89.8 fl (ref 78.0–100.0)
Monocytes Absolute: 0.4 10*3/uL (ref 0.1–1.0)
Monocytes Relative: 7.2 % (ref 3.0–12.0)
Neutro Abs: 2.6 10*3/uL (ref 1.4–7.7)
Neutrophils Relative %: 47.3 % (ref 43.0–77.0)
Platelets: 340 10*3/uL (ref 150.0–400.0)
RBC: 4.26 Mil/uL (ref 3.87–5.11)
RDW: 12.5 % (ref 11.5–15.5)
WBC: 5.6 10*3/uL (ref 4.0–10.5)

## 2022-04-16 LAB — COMPREHENSIVE METABOLIC PANEL
ALT: 16 U/L (ref 0–35)
AST: 17 U/L (ref 0–37)
Albumin: 4.5 g/dL (ref 3.5–5.2)
Alkaline Phosphatase: 51 U/L (ref 39–117)
BUN: 18 mg/dL (ref 6–23)
CO2: 33 mEq/L — ABNORMAL HIGH (ref 19–32)
Calcium: 9.5 mg/dL (ref 8.4–10.5)
Chloride: 101 mEq/L (ref 96–112)
Creatinine, Ser: 1.18 mg/dL (ref 0.40–1.20)
GFR: 49.43 mL/min — ABNORMAL LOW (ref >60.00)
Glucose, Bld: 112 mg/dL — ABNORMAL HIGH (ref 70–99)
Potassium: 3.4 mEq/L — ABNORMAL LOW (ref 3.5–5.1)
Sodium: 140 mEq/L (ref 135–145)
Total Bilirubin: 0.5 mg/dL (ref 0.2–1.2)
Total Protein: 7.5 g/dL (ref 6.0–8.3)

## 2022-04-16 LAB — LIPID PANEL
Cholesterol: 207 mg/dL — ABNORMAL HIGH (ref 0–200)
HDL: 50.1 mg/dL (ref >39.00)
LDL Cholesterol: 118 mg/dL — ABNORMAL HIGH (ref 0–99)
NonHDL: 157.13
Total CHOL/HDL Ratio: 4
Triglycerides: 194 mg/dL — ABNORMAL HIGH (ref 0.0–149.0)
VLDL: 38.8 mg/dL (ref 0.0–40.0)

## 2022-04-16 LAB — VITAMIN D 25 HYDROXY (VIT D DEFICIENCY, FRACTURES): VITD: 30.39 ng/mL (ref 30.00–100.00)

## 2022-04-16 LAB — HEMOGLOBIN A1C: Hgb A1c MFr Bld: 5 % (ref 4.6–6.5)

## 2022-04-16 NOTE — Assessment & Plan Note (Signed)
Chronic Advised regular exercise Advised decreased portions, low in carbs/sugars - high in protein/veges

## 2022-04-16 NOTE — Assessment & Plan Note (Signed)
Chronic Taking vitamin d daily Check vitamin d level  

## 2022-04-16 NOTE — Assessment & Plan Note (Signed)
Chronic Check a1c Low sugar / carb diet Stressed regular exercise  

## 2022-04-16 NOTE — Assessment & Plan Note (Signed)
Chronic Blood pressure well controlled CMP Continue amlodipine 5 mg daily, valsartan-HCT 320-25 mg daily

## 2022-04-16 NOTE — Addendum Note (Signed)
Addended by: Binnie Rail on: 04/16/2022 09:32 PM   Modules accepted: Orders

## 2022-04-20 ENCOUNTER — Other Ambulatory Visit: Payer: BC Managed Care – PPO

## 2022-05-14 ENCOUNTER — Encounter: Payer: Self-pay | Admitting: Internal Medicine

## 2022-06-11 ENCOUNTER — Other Ambulatory Visit: Payer: Self-pay | Admitting: Internal Medicine

## 2022-07-07 IMAGING — MR MR PELVIS WO/W CM
8 of 10 series · 31 of 48 positions shown · IV contrast (multihance)
Comparison: 03/21/2019 abdominopelvic CT.

CLINICAL DATA: Left lower quadrant pain. Groin pain. Hernia surgery
x2. "Recurrent incisional hernia" . Incisional hernia repair
03/02/2013.

EXAM:
MRI ABDOMEN AND PELVIS WITHOUT AND WITH CONTRAST
TECHNIQUE: Multiplanar multisequence MR imaging of the abdomen and pelvis was
performed both before and after the administration of intravenous
contrast.
CONTRAST:  18mL MULTIHANCE GADOBENATE DIMEGLUMINE 529 MG/ML IV SOLN

[Series 6: cor haste · coronal · 4.5mm · 0.78mm/px · 4 of 36 slices shown (1 of 2)]
[im 1/36]
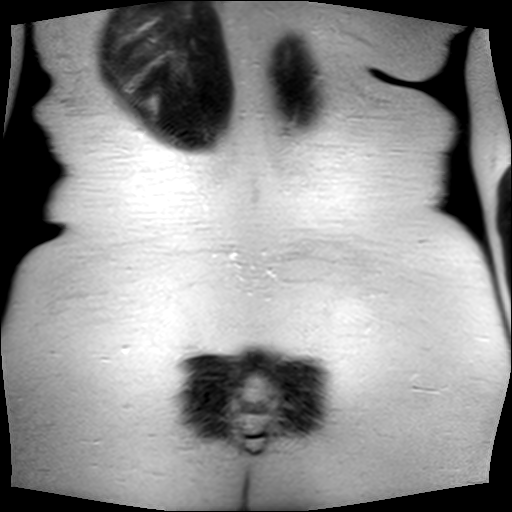
[im 12/36]
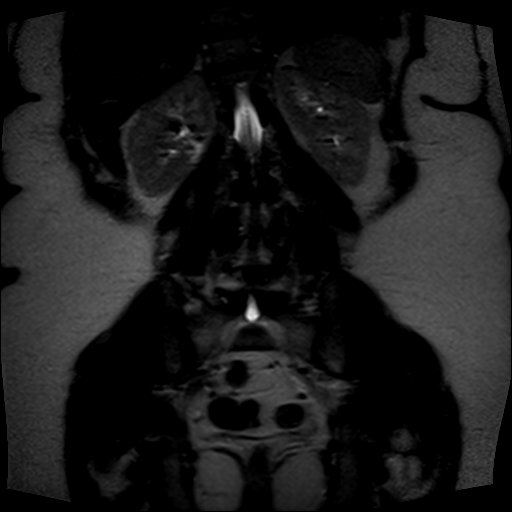
[im 24/36]
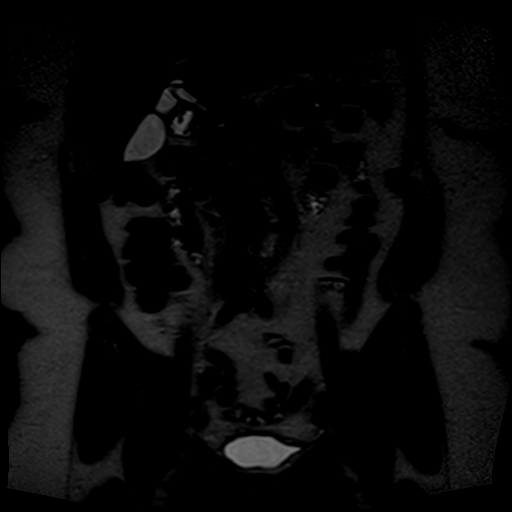
[im 36/36]
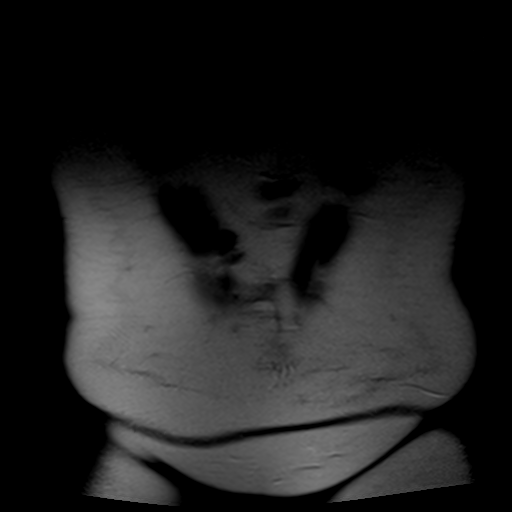

[Series 7: t2_tse_sag · sagittal · 5.0mm · 0.98mm/px · 4 of 32 slices shown]
[im 1/32]
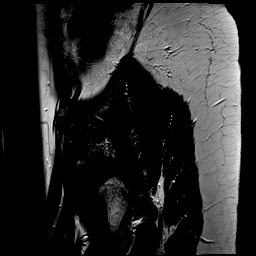
[im 11/32]
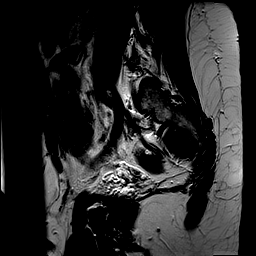
[im 21/32]
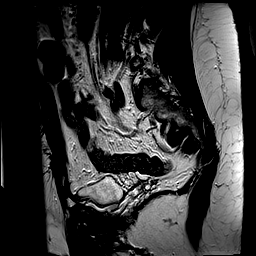
[im 32/32]
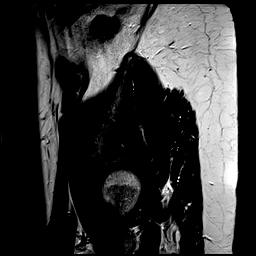

[Series 8: axial spgr · axial · 6.0mm · 0.94mm/px · z∈[-109,+117]mm · 4 of 30 slices shown]
[im 1/30]
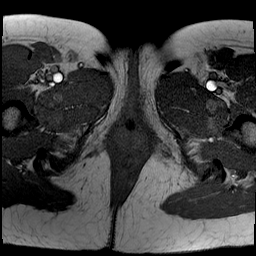
[im 10/30]
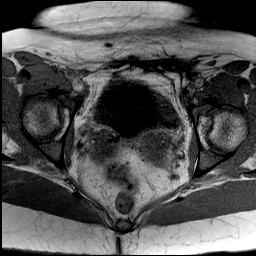
[im 20/30]
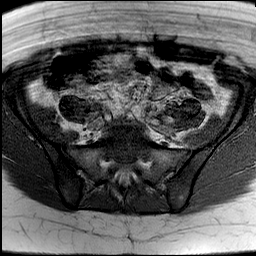
[im 30/30]
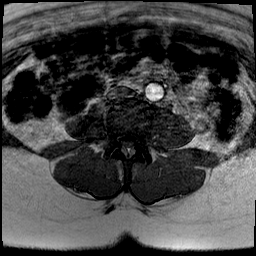

[Series 9: t2_tse axial · axial · 6.0mm · 0.98mm/px · z∈[-109,+117]mm · 4 of 30 slices shown]
[im 1/30]
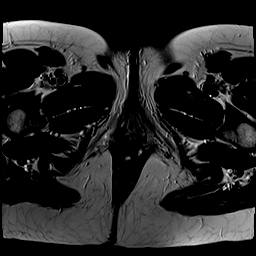
[im 10/30]
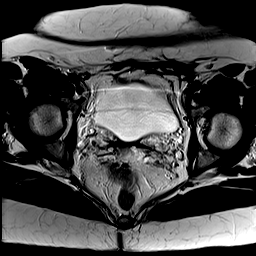
[im 20/30]
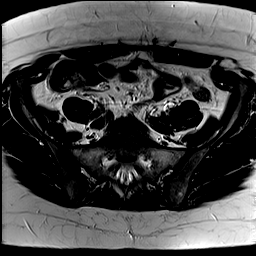
[im 30/30]
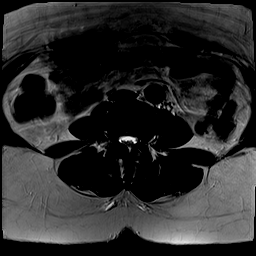

[Series 11: repeat t2_tse_sag · sagittal · 5.0mm · 1.02mm/px · 4 of 32 slices shown]
[im 1/32]
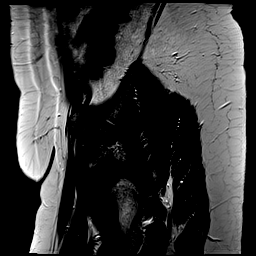
[im 11/32]
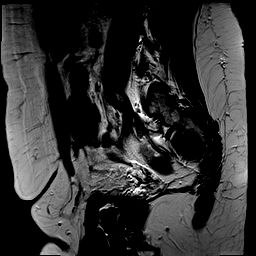
[im 21/32]
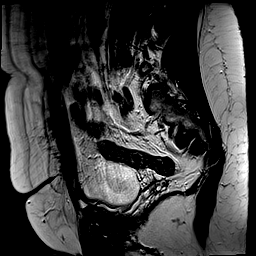
[im 32/32]
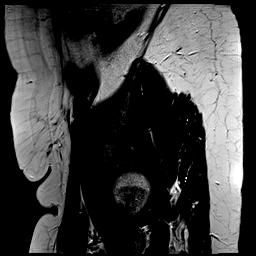

[Series 14: cor haste · coronal · 4.5mm · 0.78mm/px · 5 of 40 slices shown (2 of 2)]
[im 1/40]
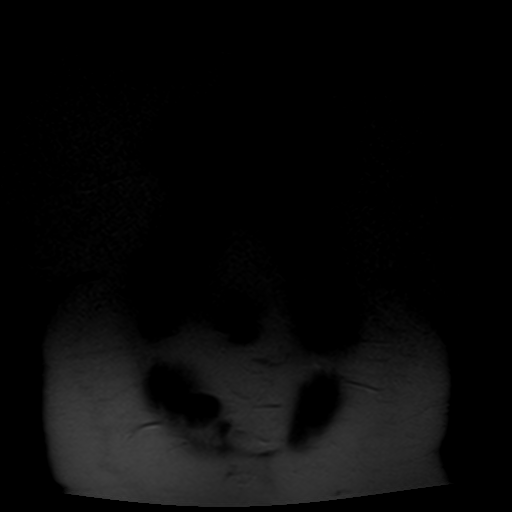
[im 10/40]
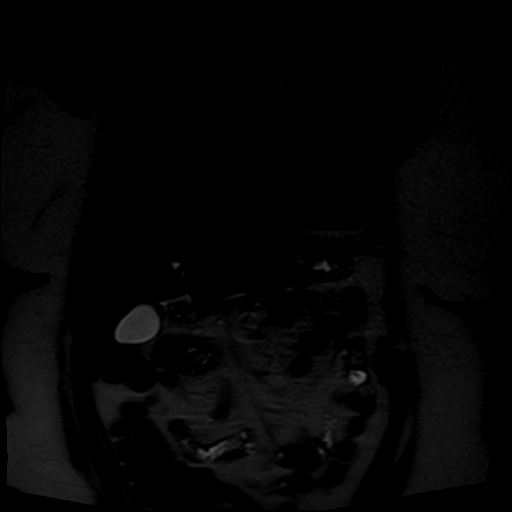
[im 20/40]
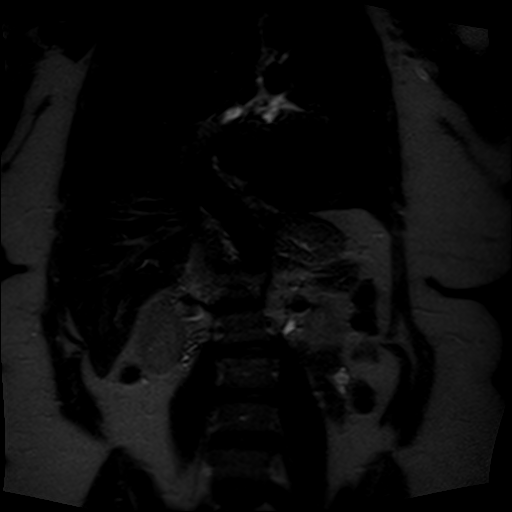
[im 30/40]
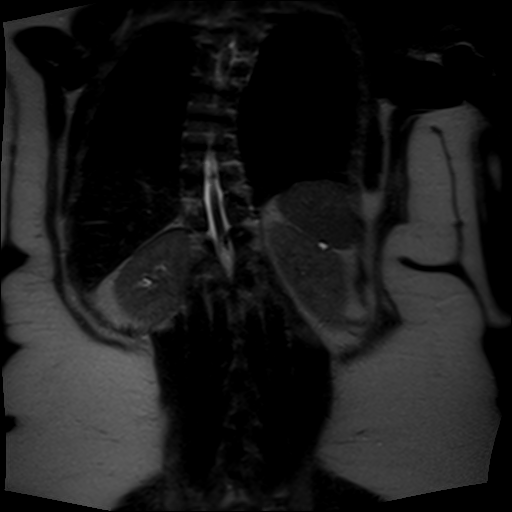
[im 40/40]
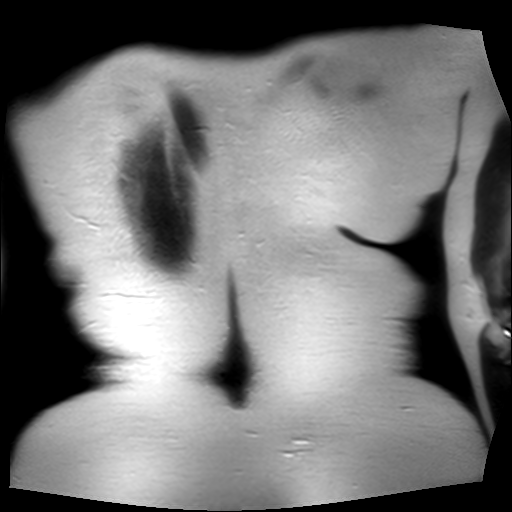

[Series 15: bSSFP · axial · 6.0mm · 0.74mm/px · z∈[+105,+342]mm · 5 of 37 slices shown]
[im 1/37]
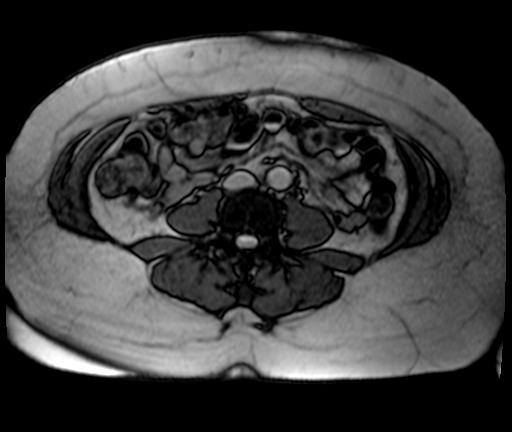
[im 10/37]
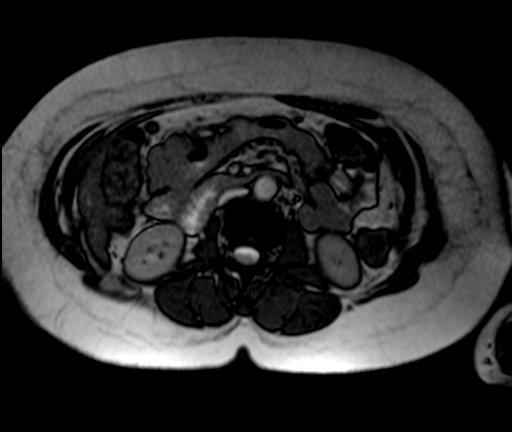
[im 19/37]
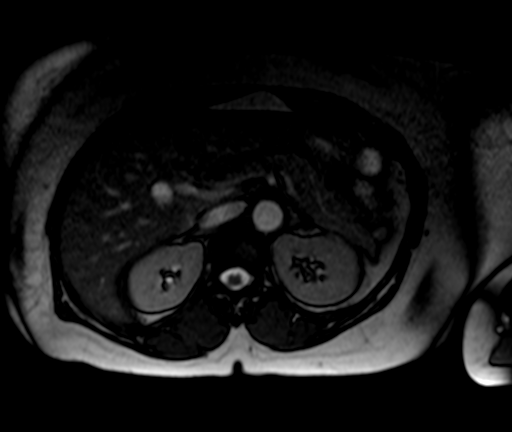
[im 28/37]
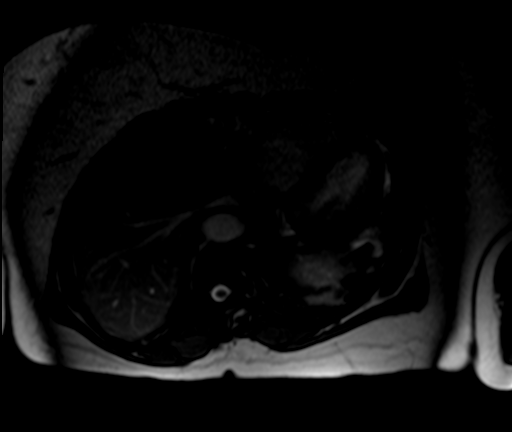
[im 37/37]
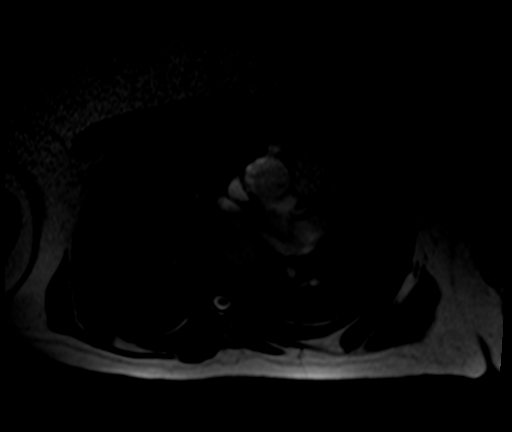

[Series 16: T1 · axial · 6.0mm · 0.78mm/px · 1 of 74 slices shown]
[im 1/74]
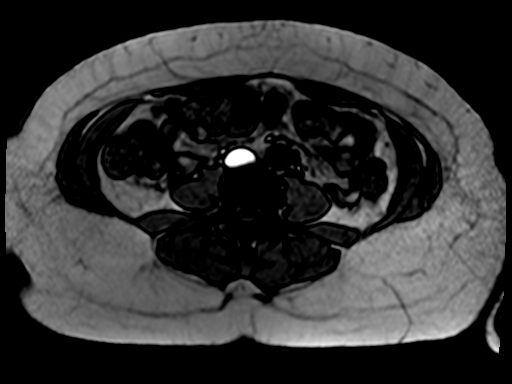

[31 of 48 positions shown; findings below may reference images not displayed]

FINDINGS: COMBINED FINDINGS FOR BOTH MR ABDOMEN AND PELVIS

Lower chest: Mild cardiomegaly without pericardial or pleural
effusion.

Hepatobiliary: Normal liver. Normal gallbladder, without biliary
ductal dilatation.

Pancreas:  Normal, without mass or ductal dilatation.

Spleen:  Normal in size, without focal abnormality.

Adrenals/Urinary Tract: Normal adrenal glands. Tiny bilateral renal
T2 hyperintense lesions which are likely cysts. No hydronephrosis.
Normal urinary bladder.

Stomach/Bowel: Normal colon and terminal ileum.  Normal small bowel.

Vascular/Lymphatic: Normal aortic caliber. No abdominopelvic
adenopathy.

Reproductive: Hysterectomy.  No adnexal mass.

Other: No ascites. No abdominopelvic wall mass. Susceptibility
artifact on the anterior pelvic wall is eccentric left and
postoperative. mild ventral wall laxity containing fat on [DATE],
similar to on the prior CT.

Musculoskeletal: Moderate S shaped thoracolumbar spine curvature.
IMPRESSION: Surgical changes within the anterior pelvic wall with an area of
chronic mild ventral wall laxity containing fat. Otherwise, no
evidence of hernia, mass, or other explanation for pain.

## 2022-07-25 ENCOUNTER — Encounter: Payer: Self-pay | Admitting: Internal Medicine

## 2022-07-25 NOTE — Progress Notes (Unsigned)
    Subjective:    Patient ID: Helen Young, female    DOB: Nov 17, 1960, 61 y.o.   MRN: [id number]      HPI Dillan is here for No chief complaint on file.   Anjalee is here for an acute visit for cold symptoms.   Her symptoms started   She is experiencing   She has tried taking       Medications and allergies reviewed with patient and updated if appropriate.  Current Outpatient Medications on File Prior to Visit  Medication Sig Dispense Refill   amLODipine (NORVASC) 5 MG tablet TAKE 1 TABLET(5 MG) BY MOUTH DAILY 90 tablet 1   Calcium Citrate-Vitamin D (CALCIUM CITRATE + D3) 200-250 MG-UNIT TABS Take 1 capsule by mouth daily.     potassium gluconate 595 (99 K) MG TABS tablet Take 595 mg by mouth daily.     valsartan-hydrochlorothiazide (DIOVAN-HCT) 320-25 MG tablet TAKE 1 TABLET BY MOUTH DAILY 90 tablet 1   vitamin B-12 (CYANOCOBALAMIN) 500 MCG tablet Take 500 mcg by mouth daily.     Vitamin D, Ergocalciferol, (DRISDOL) 1.25 MG (50000 UNIT) CAPS capsule Take 1 capsule (50,000 Units total) by mouth every 7 (seven) days. 8 capsule 0   No current facility-administered medications on file prior to visit.    Review of Systems     Objective:  There were no vitals filed for this visit. BP Readings from Last 3 Encounters:  04/16/22 132/80  03/17/22 132/84  03/15/22 (!) 140/107   Wt Readings from Last 3 Encounters:  04/16/22 197 lb (89.4 kg)  03/17/22 194 lb (88 kg)  03/15/22 190 lb (86.2 kg)   There is no height or weight on file to calculate BMI.    Physical Exam         Assessment & Plan:    See Problem List for Assessment and Plan of chronic medical problems.

## 2022-07-26 ENCOUNTER — Ambulatory Visit (INDEPENDENT_AMBULATORY_CARE_PROVIDER_SITE_OTHER): Payer: BC Managed Care – PPO | Admitting: Internal Medicine

## 2022-07-26 VITALS — BP 128/84 | HR 84 | Temp 98.0°F | Ht 64.0 in | Wt 198.0 lb

## 2022-07-26 DIAGNOSIS — J0100 Acute maxillary sinusitis, unspecified: Secondary | ICD-10-CM | POA: Diagnosis not present

## 2022-07-26 DIAGNOSIS — I10 Essential (primary) hypertension: Secondary | ICD-10-CM

## 2022-07-26 DIAGNOSIS — J019 Acute sinusitis, unspecified: Secondary | ICD-10-CM | POA: Insufficient documentation

## 2022-07-26 MED ORDER — AMOXICILLIN-POT CLAVULANATE 875-125 MG PO TABS
1.0000 | ORAL_TABLET | Freq: Two times a day (BID) | ORAL | 0 refills | Status: DC
Start: 2022-07-26 — End: 2023-02-23

## 2022-07-26 NOTE — Assessment & Plan Note (Signed)
Acute Ongoing for at least two weeks Likely bacterial  Start Augmentin 875-125 mg BID x 10 day otc cold medications Rest, fluid Call if no improvement

## 2022-07-26 NOTE — Assessment & Plan Note (Signed)
Chronic BP well controlled Continue amlodipine 5 mg daily, valsartan -hct 320-25 mg daily

## 2022-07-26 NOTE — Patient Instructions (Addendum)
       Medications changes include :   Augmentin twice daily x 10 days    Return if symptoms worsen or fail to improve.

## 2022-08-06 ENCOUNTER — Ambulatory Visit (INDEPENDENT_AMBULATORY_CARE_PROVIDER_SITE_OTHER): Payer: BC Managed Care – PPO | Admitting: *Deleted

## 2022-08-06 DIAGNOSIS — Z23 Encounter for immunization: Secondary | ICD-10-CM

## 2022-09-02 DIAGNOSIS — Z1231 Encounter for screening mammogram for malignant neoplasm of breast: Secondary | ICD-10-CM | POA: Diagnosis not present

## 2022-09-02 DIAGNOSIS — Z01419 Encounter for gynecological examination (general) (routine) without abnormal findings: Secondary | ICD-10-CM | POA: Diagnosis not present

## 2022-09-02 LAB — HM MAMMOGRAPHY

## 2022-09-03 ENCOUNTER — Encounter: Payer: Self-pay | Admitting: Internal Medicine

## 2022-09-03 NOTE — Progress Notes (Signed)
Outside notes received. Information abstracted. Notes sent to scan.  

## 2022-10-05 ENCOUNTER — Encounter: Payer: Self-pay | Admitting: Gastroenterology

## 2022-11-03 ENCOUNTER — Encounter: Payer: Self-pay | Admitting: Gastroenterology

## 2022-11-03 ENCOUNTER — Ambulatory Visit (INDEPENDENT_AMBULATORY_CARE_PROVIDER_SITE_OTHER): Payer: BC Managed Care – PPO | Admitting: Gastroenterology

## 2022-11-03 VITALS — BP 140/88 | HR 94 | Ht 65.0 in | Wt 199.0 lb

## 2022-11-03 DIAGNOSIS — R1032 Left lower quadrant pain: Secondary | ICD-10-CM | POA: Insufficient documentation

## 2022-11-03 DIAGNOSIS — K5900 Constipation, unspecified: Secondary | ICD-10-CM | POA: Diagnosis not present

## 2022-11-03 NOTE — Patient Instructions (Addendum)
Referral placed to [facility].   _______________________________________________________  If your blood pressure at your visit was 140/90 or greater, please contact your primary care physician to follow up on this.  _______________________________________________________  If you are age 64 or older, your body mass index should be between 23-30. Your Body mass index is 33.12 kg/m. If this is out of the aforementioned range listed, please consider follow up with your Primary Care Provider.  If you are age 64 or younger, your body mass index should be between 19-25. Your Body mass index is 33.12 kg/m. If this is out of the aformentioned range listed, please consider follow up with your Primary Care Provider.   ________________________________________________________  The [facility] providers would like to encourage you to use [facility] to communicate with providers for non-urgent requests or questions.  Due to long hold times on the telephone, sending your provider a message by [facility] may be a faster and more efficient way to get a response.  Please allow 48 business hours for a response.  Please remember that this is for non-urgent requests.  _______________________________________________________

## 2022-11-03 NOTE — Progress Notes (Signed)
11/03/2022 Helen Young [id number] 29-Jun-1961   HISTORY OF PRESENT ILLNESS: This is a 62 year old female who is a patient of Dr. Ardis Hughs.  Has family history of colon cancer in her father.  She has had several colonoscopies, last in January 2021 as below.  She is here for evaluation of left lower quadrant abdominal pain.  She states that it is more uncomfortable if she has been standing for a while cooking or baking, etc.  She also notices a knot or lump in that area.  Looking back she was seen here in 2015 for the same issue and they talked about history of hernias, etc. at that time.  She does admit to some constipation, which is occasional.  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  She does have occasional small amounts of bright red blood or pink on toilet paper upon wiping.  CT scan abdomen and pelvis with contrast 12/2021:  IMPRESSION: 1. No acute abnormality in the abdomen or pelvis, specifically no finding to explain patient's chronic left lower quadrant abdominal pain. 2. Scattered colonic diverticulosis without findings of acute diverticulitis. 3. Moderate volume of formed stool in the colon, suggestive of constipation.  Colonoscopy 09/2019 with a 4 mm polyp removed; tubular adenoma.   Past Medical History:  Diagnosis Date   Allergy    seasonal, environmental   Colon polyp 2021   Environmental allergies 02/27/2013   OTC meds daily   Headache(784.0) 02/27/2013   occ. migraines   HTN (hypertension)    Transfusion history    childhood s/p Tonsillectomy   Past Surgical History:  Procedure Laterality Date   ABDOMINAL HYSTERECTOMY     BLADDER REPAIR     BREAST REDUCTION SURGERY  03/16/2016   COLONOSCOPY     CYSTOSTOMY N/A 03/02/2013   Procedure: CYSTOSTOMY CLOSURE ;  Surgeon: Alexis Frock, MD;  Location: [facility];  Service: Urology;  Laterality: N/A;    INCISIONAL HERNIA REPAIR N/A 03/02/2013   Procedure: LAPAROSCOPIC INCISIONAL HERNIA CONVERTED TO OPEN ;  Surgeon: Harl Bowie, MD;  Location: [facility];  Service: General;  Laterality: N/A;   INGUINAL HERNIA REPAIR     x's 2    KNEE SURGERY Left    torn meniscus - arthroscopic repair   NECK SURGERY     Disc replaced with a steel plate   TONSILLECTOMY     childhoood   TOTAL ABDOMINAL HYSTERECTOMY W/ BILATERAL SALPINGOOPHORECTOMY     prolapsed uterus at 38-94 years old, ovaries and tubes removed 10 years later due to cysts    reports that she quit smoking about 17 years ago. She has never used smokeless tobacco. She reports current alcohol use. She reports that she does not use drugs. family history includes Colon cancer in her father; Colon polyps in her father; Diabetes in her maternal grandfather; Heart disease in her father and mother; Heart failure in her cousin, father, and paternal uncle; Hypertension in her father and mother; Kidney disease in her father and maternal grandfather; Prostate cancer in her father; Stroke in her mother; Sudden death in her paternal grandfather and paternal uncle. Allergies  Allergen Reactions   Fish Allergy Itching and Swelling   Percocet [Oxycodone-Acetaminophen] Swelling    Outpatient Encounter Medications as of 11/03/2022  Medication Sig   amLODipine (NORVASC) 5 MG tablet TAKE 1 TABLET(5 MG) BY MOUTH DAILY  amoxicillin-clavulanate (AUGMENTIN) 875-125 MG tablet Take 1 tablet by mouth 2 (two) times daily.   Calcium Citrate-Vitamin D (CALCIUM CITRATE + D3) 200-250 MG-UNIT TABS Take 1 capsule by mouth daily.   potassium gluconate 595 (99 K) MG TABS tablet Take 595 mg by mouth daily.   valsartan-hydrochlorothiazide (DIOVAN-HCT) 320-25 MG tablet TAKE 1 TABLET BY MOUTH DAILY   vitamin B-12 (CYANOCOBALAMIN) 500 MCG tablet Take 500 mcg by mouth daily.   Vitamin D, Ergocalciferol, (DRISDOL) 1.25 MG (50000 UNIT) CAPS capsule Take 1 capsule (50,000 Units  total) by mouth every 7 (seven) days.   No facility-administered encounter medications on file as of 11/03/2022.    REVIEW OF SYSTEMS  : All other systems reviewed and negative except where noted in the History of Present Illness.   PHYSICAL EXAM: BP (!) 140/88   Pulse 94   Ht '5\' 5"'$  (1.651 m)   Wt 199 lb (90.3 kg)   SpO2 97%   BMI 33.12 kg/m  General: Well developed AA female in no acute distress Head: Normocephalic and atraumatic Eyes:  Sclerae anicteric, conjunctiva pink. Ears: Normal auditory acuity Lungs: Clear throughout to auscultation; no W/R/R. Heart: Regular rate and rhythm; no M/R/G. Abdomen: Soft, non-distended.  BS present.  LLQ TTP noted.  Lump/knot (? hernia) noted on exam when standing. Musculoskeletal: Symmetrical with no gross deformities  Skin: No lesions on visible extremities Extremities: No edema  Neurological: Alert oriented x 4, grossly non-focal Psychological:  Alert and cooperative. Normal mood and affect  ASSESSMENT AND PLAN: *Left lower quadrant abdominal discomfort with sensation and palpation of a knot in that area.  I do feel what she is describing when she stands, which is when she can tend to feel it as well.  Most likely to be a hernia.  Was not seen on CT scan a year ago.  She is up-to-date with colonoscopy with that being 3 years ago.  She does have some constipation, but does not necessarily think that it affects the left lower quadrant issue.  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  Describes pain/discomfort being more so on when she is on her feet for a long period of time.  She has seen Dr. Ninfa Linden [facility] for hernia in the past.  Will refer to [facility] for evaluation as well.  ?  Adhesive/scar tissue disease also possible.   CC:  Binnie Rail, MD

## 2022-11-04 NOTE — Progress Notes (Signed)
Referral faxed to [facility] - Attn: Ninfa Linden, MD

## 2022-11-05 NOTE — Progress Notes (Signed)
Agree with the assessment and plan as outlined by Alonza Bogus, PA-C.  Rudy Domek E. Candis Schatz, MD  [facility]

## 2022-12-09 ENCOUNTER — Other Ambulatory Visit: Payer: Self-pay | Admitting: Surgery

## 2022-12-09 DIAGNOSIS — R1032 Left lower quadrant pain: Secondary | ICD-10-CM | POA: Diagnosis not present

## 2023-02-02 ENCOUNTER — Encounter (HOSPITAL_COMMUNITY): Payer: Self-pay

## 2023-02-14 NOTE — Patient Instructions (Signed)
DUE TO COVID-19 ONLY TWO VISITORS  (aged 64 and older)  ARE ALLOWED TO COME WITH YOU AND STAY IN THE WAITING ROOM ONLY DURING PRE OP AND PROCEDURE.   **NO VISITORS ARE ALLOWED IN THE SHORT STAY AREA OR RECOVERY ROOM!!**  IF YOU WILL BE ADMITTED INTO THE HOSPITAL YOU ARE ALLOWED ONLY FOUR SUPPORT PEOPLE DURING VISITATION HOURS ONLY (7 AM -8PM)   The support person(s) must pass our screening, gel in and out, and wear a mask at all times, including in the patient's room. Patients must also wear a mask when staff or their support person are in the room. Visitors GUEST BADGE MUST BE WORN VISIBLY  One adult visitor may remain with you overnight and MUST be in the room by 8 P.M.     Your procedure is scheduled on: 02/23/23   Report to [facility] Main Entrance    Report to admitting at: 6:15 AM   Call this number if you have problems the morning of surgery [phone]   Do not eat food :After Midnight.   After Midnight you may have the following liquids until: 5:30 AM DAY OF SURGERY  Water Black Coffee (sugar ok, NO MILK/CREAM OR CREAMERS)  Tea (sugar ok, NO MILK/CREAM OR CREAMERS) regular and decaf                             Plain Jell-O (NO RED)                                           Fruit ices (not with fruit pulp, NO RED)                                     Popsicles (NO RED)                                                                  Juice: apple, WHITE grape, WHITE cranberry Sports drinks like Gatorade (NO RED)   The day of surgery:  Drink ONE (1) Pre-Surgery Clear Ensure at : 5:30 AM the morning of surgery. Drink in one sitting. Do not sip.  This drink was given to you during your hospital  pre-op appointment visit. Nothing else to drink after completing the  Pre-Surgery Clear Ensure or G2.          If you have questions, please contact your surgeon's office.   Oral Hygiene is also important to reduce your risk of infection.                                     Remember - BRUSH YOUR TEETH THE MORNING OF SURGERY WITH YOUR REGULAR TOOTHPASTE  DENTURES WILL BE REMOVED PRIOR TO SURGERY PLEASE DO NOT APPLY "Poly grip" OR ADHESIVES!!!   Do NOT smoke after Midnight   Take these medicines the morning of surgery with A SIP OF WATER: amlodipine.  DO NOT TAKE ANY ORAL DIABETIC MEDICATIONS DAY  OF YOUR SURGERY  Bring CPAP mask and tubing day of surgery.                              You may not have any metal on your body including hair pins, jewelry, and body piercing             Do not wear make-up, lotions, powders, perfumes/cologne, or deodorant  Do not wear nail polish including gel and S&S, artificial/acrylic nails, or any other type of covering on natural nails including finger and toenails. If you have artificial nails, gel coating, etc. that needs to be removed by a nail salon please have this removed prior to surgery or surgery may need to be canceled/ delayed if the surgeon/ anesthesia feels like they are unable to be safely monitored.   Do not shave  48 hours prior to surgery.    Do not bring valuables to the hospital.  IS NOT             RESPONSIBLE   FOR VALUABLES.   Contacts, glasses, or bridgework may not be worn into surgery.   Bring small overnight bag day of surgery.   DO NOT BRING YOUR HOME MEDICATIONS TO THE HOSPITAL. PHARMACY WILL DISPENSE MEDICATIONS LISTED ON YOUR MEDICATION LIST TO YOU DURING YOUR ADMISSION IN THE HOSPITAL!    Patients discharged on the day of surgery will not be allowed to drive home.  Someone NEEDS to stay with you for the first 24 hours after anesthesia.   Special Instructions: Bring a copy of your healthcare power of attorney and living will documents         the day of surgery if you haven't scanned them before.              Please read over the following fact sheets you were given: IF YOU HAVE QUESTIONS ABOUT YOUR PRE-OP INSTRUCTIONS PLEASE CALL [phone]    [facility] - Preparing for  Surgery Before surgery, you can play an important role.  Because skin is not sterile, your skin needs to be as free of germs as possible.  You can reduce the number of germs on your skin by washing with CHG (chlorahexidine gluconate) soap before surgery.  CHG is an antiseptic cleaner which kills germs and bonds with the skin to continue killing germs even after washing. Please DO NOT use if you have an allergy to CHG or antibacterial soaps.  If your skin becomes reddened/irritated stop using the CHG and inform your nurse when you arrive at Short Stay. Do not shave (including legs and underarms) for at least 48 hours prior to the first CHG shower.  You may shave your face/neck. Please follow these instructions carefully:  1.  Shower with CHG Soap the night before surgery and the  morning of Surgery.  2.  If you choose to wash your hair, wash your hair first as usual with your  normal  shampoo.  3.  After you shampoo, rinse your hair and body thoroughly to remove the  shampoo.                           4.  Use CHG as you would any other liquid soap.  You can apply chg directly  to the skin and wash  Gently with a scrungie or clean washcloth.  5.  Apply the CHG Soap to your body ONLY FROM THE NECK DOWN.   Do not use on face/ open                           Wound or open sores. Avoid contact with eyes, ears mouth and genitals (private parts).                       Wash face,  Genitals (private parts) with your normal soap.             6.  Wash thoroughly, paying special attention to the area where your surgery  will be performed.  7.  Thoroughly rinse your body with warm water from the neck down.  8.  DO NOT shower/wash with your normal soap after using and rinsing off  the CHG Soap.                9.  Pat yourself dry with a clean towel.            10.  Wear clean pajamas.            11.  Place clean sheets on your bed the night of your first shower and do not  sleep with pets. Day  of Surgery : Do not apply any lotions/deodorants the morning of surgery.  Please wear clean clothes to the hospital/surgery center.  FAILURE TO FOLLOW THESE INSTRUCTIONS MAY RESULT IN THE CANCELLATION OF YOUR SURGERY PATIENT SIGNATURE_________________________________  NURSE SIGNATURE__________________________________  ________________________________________________________________________

## 2023-02-15 ENCOUNTER — Encounter (HOSPITAL_COMMUNITY): Payer: Self-pay

## 2023-02-15 ENCOUNTER — Encounter (HOSPITAL_COMMUNITY)
Admission: RE | Admit: 2023-02-15 | Discharge: 2023-02-15 | Disposition: A | Discharge status: Home / Self Care | Payer: BC Managed Care – PPO | Source: Ambulatory Visit | Attending: Surgery | Admitting: Surgery

## 2023-02-15 ENCOUNTER — Other Ambulatory Visit: Payer: Self-pay

## 2023-02-15 VITALS — BP 162/105 | HR 82 | Temp 98.2°F | Resp 18 | Ht 65.5 in | Wt 198.0 lb

## 2023-02-15 DIAGNOSIS — Z01818 Encounter for other preprocedural examination: Secondary | ICD-10-CM | POA: Diagnosis not present

## 2023-02-15 DIAGNOSIS — I10 Essential (primary) hypertension: Secondary | ICD-10-CM | POA: Diagnosis not present

## 2023-02-15 LAB — CBC
HCT: 37.4 % (ref 36.0–46.0)
Hemoglobin: 11.9 g/dL — ABNORMAL LOW (ref 12.0–15.0)
MCH: 29.1 pg (ref 26.0–34.0)
MCHC: 31.8 g/dL (ref 30.0–36.0)
MCV: 91.4 fL (ref 80.0–100.0)
Platelets: 305 10*3/uL (ref 150–400)
RBC: 4.09 MIL/uL (ref 3.87–5.11)
RDW: 11.8 % (ref 11.5–15.5)
WBC: 4.7 10*3/uL (ref 4.0–10.5)
nRBC: 0 % (ref 0.0–0.2)

## 2023-02-15 LAB — BASIC METABOLIC PANEL
Anion gap: 7 (ref 5–15)
BUN: 14 mg/dL (ref 8–23)
CO2: 27 mmol/L (ref 22–32)
Calcium: 9.2 mg/dL (ref 8.9–10.3)
Chloride: 106 mmol/L (ref 98–111)
Creatinine, Ser: 0.71 mg/dL (ref 0.44–1.00)
GFR, Estimated: >60 mL/min (ref >60)
Glucose, Bld: 84 mg/dL (ref 70–99)
Potassium: 3.7 mmol/L (ref 3.5–5.1)
Sodium: 140 mmol/L (ref 135–145)

## 2023-02-15 NOTE — Progress Notes (Addendum)
For Short Stay: COVID SWAB appointment date:  Bowel Prep reminder:   For Anesthesia: PCP - Dr. Cheryll Cockayne. LOV: 07/26/22 Cardiologist - Dr. Chilton Si. LOV: 03/16/21  Chest x-ray -  EKG - 02/15/23 Stress Test -  ECHO -  Cardiac Cath -  Pacemaker/ICD device last checked: Pacemaker orders received: Device Rep notified:  Spinal Cord Stimulator: N/A  Sleep Study - N/A CPAP -   Fasting Blood Sugar - N/A Checks Blood Sugar _____ times a day Date and result of last Hgb A1c-  Last dose of GLP1 agonist-  GLP1 instructions:   Last dose of SGLT-2 inhibitors- N/A SGLT-2 instructions:   Blood Thinner Instructions: N/A Aspirin Instructions: Last Dose:  Activity level: Can go up a flight of stairs and activities of daily living without stopping and without chest pain and/or shortness of breath   Able to exercise without chest pain and/or shortness of breath  Anesthesia review: Hx: HTN. Pt.'s BP was elevated during PST appoinment.As per pt. It's not that high when she check it at home.Pt. was advised to call PCP and informed MD. About BP readings at PST. BP: 150/108; 165/114; 162/105  Patient denies shortness of breath, fever, cough and chest pain at PAT appointment   Patient verbalized understanding of instructions that were given to them at the PAT appointment. Patient was also instructed that they will need to review over the PAT instructions again at home before surgery.

## 2023-02-16 ENCOUNTER — Encounter (HOSPITAL_COMMUNITY): Payer: Self-pay

## 2023-02-22 NOTE — H&P (Signed)
REFERRING PHYSICIAN: Leta Baptist, PA PROVIDER: Wayne Both, MD MRN: [id number] DOB: 03-Jun-1961  Subjective   Chief Complaint: New Consultation (Incisional hernia)  History of Present Illness: Helen Young is a 62 y.o. female who is seen as an office consultation for evaluation of New Consultation (Incisional hernia)  This is a 63 year old female that I operated on back in 2014 for an incisional hernia at her old Pfannenstiel incision. She had had multiple hernia repairs in the past. I attempted this laparoscopic but she has significant scar tissue with the bladder stuck to the abdominal wall and the bladder was entered. Urology repair of the bladder and we primarily repaired the hernia. She has done well until recently when she started having left lower quadrant abdominal pain.  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 She has no nausea or vomiting currently. She had a CT scan of her abdomen and pelvis last year. She had a small ventral hernia containing fat but no abnormalities in the left lower quadrant.  Review of Systems: A complete review of systems was obtained from the patient. I have reviewed this information and discussed as appropriate with the patient. See HPI as well for other ROS.  ROS   Medical History: Past Medical History:  Diagnosis Date  Hypertension   Patient Active Problem List  Diagnosis  Hypertension   Past Surgical History:  Procedure Laterality Date  CERVICAL FUSION  with disc replacement (C4-6)  HERNIA REPAIR  x2  HYSTERECTOMY  MENISCECTOMY  left knee  OVARIAN CYST REMOVAL    Allergies  Allergen Reactions  Percocet [Oxycodone-Acetaminophen] Itching, Swelling and Rash   Current Outpatient Medications on File Prior to Visit  Medication Sig Dispense Refill  amLODIPine (NORVASC) 5 MG tablet Take 5 mg by mouth daily.  estrogens, conjugated,  (PREMARIN) 0.625 MG tablet Take 0.625 mg by mouth daily.  valsartan-hydrochlorothiazide (DIOVAN-HCT) 160-12.5 mg tablet Take 1 tablet by mouth daily.   No current facility-administered medications on file prior to visit.   Family History  Problem Relation Age of Onset  Stroke Mother  High blood pressure (Hypertension) Mother  Colon cancer Father  High blood pressure (Hypertension) Father  Obesity Sister  High blood pressure (Hypertension) Sister  Bipolar disorder Sister    Social History   Tobacco Use  Smoking Status Former  Types: Cigarettes  Smokeless Tobacco Never    Social History   Socioeconomic History  Marital status: Widowed  Tobacco Use  Smoking status: Former  Types: Cigarettes  Smokeless tobacco: Never  Vaping Use  Vaping Use: Never used  Substance and Sexual Activity  Alcohol use: Yes  Drug use: Never   Objective:   Vitals:   BP: 120/80  Pulse: (!) 114  Temp: 36.5 C (97.7 F)  SpO2: 99%  Weight: 90.6 kg (199 lb 12.8 oz)  Height: 165.1 cm (5\' 5" )  PainSc: 3   Body mass index is 33.25 kg/m.  Physical Exam   She appears well on exam  Her area of pinpoint tenderness is in the left lower quadrant lateral to and below the level of the umbilicus. She is very tender to deep palpation. It almost feels like a small mass in the deep abdominal wall.  I can feel no evidence of a midline hernia or hernias recurrent along her Pfannenstiel incision  Labs, Imaging and Diagnostic Testing: I have reviewed her notes in the electronic medical records  as well as a CT scan from last year  Assessment and Plan:   Diagnoses and all orders for this visit:  Abdominal wall pain in left lower quadrant   It is difficult to tell whether this is old suture material versus some desmoid tumor or endometrioma versus a small hernia in her lateral left lower quadrant. We discussed continue conservative management versus exploring the area in the operating room. Given  her discomfort daily, she was to proceed with surgery which I feel is very reasonable. We discussed the risks which includes but is not limited to bleeding, infection, the chance this may not resolve her discomfort, cardiopulmonary issues with anesthesia, postoperative recovery, etc. She understands and surgery will be scheduled.

## 2023-02-22 NOTE — Anesthesia Preprocedure Evaluation (Signed)
Anesthesia Evaluation  Patient identified by MRN, date of birth, ID band Patient awake    Reviewed: Allergy & Precautions, H&P , NPO status , Patient's Chart, lab work & pertinent test results  Airway Mallampati: II  TM Distance: >3 FB Neck ROM: Full    Dental no notable dental hx. (+) Teeth Intact, Dental Advisory Given   Pulmonary former smoker   Pulmonary exam normal breath sounds clear to auscultation       Cardiovascular hypertension, Pt. on medications  Rhythm:Regular Rate:Normal     Neuro/Psych  Headaches  negative psych ROS   GI/Hepatic negative GI ROS, Neg liver ROS,,,  Endo/Other  negative endocrine ROS    Renal/GU negative Renal ROS  negative genitourinary   Musculoskeletal   Abdominal   Peds  Hematology negative hematology ROS (+)   Anesthesia Other Findings   Reproductive/Obstetrics negative OB ROS                             Anesthesia Physical Anesthesia Plan  ASA: 2  Anesthesia Plan: General   Post-op Pain Management: Toradol IV (intra-op)*   Induction: Intravenous  PONV Risk Score and Plan: 4 or greater and Ondansetron, Dexamethasone, Propofol infusion and TIVA  Airway Management Planned: LMA  Additional Equipment:   Intra-op Plan:   Post-operative Plan: Extubation in OR  Informed Consent: I have reviewed the patients History and Physical, chart, labs and discussed the procedure including the risks, benefits and alternatives for the proposed anesthesia with the patient or authorized representative who has indicated his/her understanding and acceptance.     Dental advisory given  Plan Discussed with: CRNA and Surgeon  Anesthesia Plan Comments:        Anesthesia Quick Evaluation

## 2023-02-23 ENCOUNTER — Ambulatory Visit (HOSPITAL_COMMUNITY): Payer: BC Managed Care – PPO | Admitting: Anesthesiology

## 2023-02-23 ENCOUNTER — Encounter (HOSPITAL_COMMUNITY): Payer: Self-pay | Admitting: Surgery

## 2023-02-23 ENCOUNTER — Encounter (HOSPITAL_COMMUNITY)
Admission: RE | Disposition: A | Discharge status: Home / Self Care | Payer: Self-pay | Source: Ambulatory Visit | Attending: Surgery

## 2023-02-23 ENCOUNTER — Other Ambulatory Visit: Payer: Self-pay

## 2023-02-23 ENCOUNTER — Ambulatory Visit (HOSPITAL_COMMUNITY)
Admission: RE | Admit: 2023-02-23 | Discharge: 2023-02-23 | Disposition: A | Discharge status: Home / Self Care | Payer: BC Managed Care – PPO | Source: Ambulatory Visit | Attending: Surgery | Admitting: Surgery

## 2023-02-23 ENCOUNTER — Ambulatory Visit (HOSPITAL_COMMUNITY): Payer: BC Managed Care – PPO | Admitting: Medical

## 2023-02-23 DIAGNOSIS — Z79899 Other long term (current) drug therapy: Secondary | ICD-10-CM | POA: Diagnosis not present

## 2023-02-23 DIAGNOSIS — I10 Essential (primary) hypertension: Secondary | ICD-10-CM | POA: Insufficient documentation

## 2023-02-23 DIAGNOSIS — Z87891 Personal history of nicotine dependence: Secondary | ICD-10-CM | POA: Diagnosis not present

## 2023-02-23 DIAGNOSIS — Z09 Encounter for follow-up examination after completed treatment for conditions other than malignant neoplasm: Secondary | ICD-10-CM | POA: Insufficient documentation

## 2023-02-23 DIAGNOSIS — R1032 Left lower quadrant pain: Secondary | ICD-10-CM | POA: Insufficient documentation

## 2023-02-23 DIAGNOSIS — L905 Scar conditions and fibrosis of skin: Secondary | ICD-10-CM | POA: Diagnosis not present

## 2023-02-23 HISTORY — PX: LAPAROTOMY: SHX154

## 2023-02-23 SURGERY — LAPAROTOMY, EXPLORATORY
Anesthesia: General | Laterality: Left

## 2023-02-23 MED ORDER — PROPOFOL 10 MG/ML IV BOLUS
INTRAVENOUS | Status: AC
  Filled 2023-02-23: qty 20

## 2023-02-23 MED ORDER — TRAMADOL HCL 50 MG PO TABS
50.0000 mg | ORAL_TABLET | Freq: Once | ORAL | Status: AC
  Administered 2023-02-23: 50 mg via ORAL

## 2023-02-23 MED ORDER — FENTANYL CITRATE PF 50 MCG/ML IJ SOSY
25.0000 ug | PREFILLED_SYRINGE | INTRAMUSCULAR | Status: DC | PRN
  Administered 2023-02-23: 25 ug via INTRAVENOUS

## 2023-02-23 MED ORDER — PHENYLEPHRINE 80 MCG/ML (10ML) SYRINGE FOR IV PUSH (FOR BLOOD PRESSURE SUPPORT)
PREFILLED_SYRINGE | INTRAVENOUS | Status: AC
  Filled 2023-02-23: qty 10

## 2023-02-23 MED ORDER — CHLORHEXIDINE GLUCONATE CLOTH 2 % EX PADS: 6.0000 | MEDICATED_PAD | Freq: Once | CUTANEOUS | Status: DC

## 2023-02-23 MED ORDER — LACTATED RINGERS IV SOLN: INTRAVENOUS | Status: DC

## 2023-02-23 MED ORDER — ACETAMINOPHEN 500 MG PO TABS
ORAL_TABLET | ORAL | Status: AC
  Administered 2023-02-23: 1000 mg via ORAL
  Filled 2023-02-23: qty 2

## 2023-02-23 MED ORDER — ONDANSETRON HCL 4 MG/2ML IJ SOLN
INTRAMUSCULAR | Status: AC
  Filled 2023-02-23: qty 2

## 2023-02-23 MED ORDER — ORAL CARE MOUTH RINSE: 15.0000 mL | Freq: Once | OROMUCOSAL | Status: AC

## 2023-02-23 MED ORDER — PROPOFOL 10 MG/ML IV BOLUS
INTRAVENOUS | Status: DC | PRN
  Administered 2023-02-23: 180 mg via INTRAVENOUS

## 2023-02-23 MED ORDER — PHENYLEPHRINE 80 MCG/ML (10ML) SYRINGE FOR IV PUSH (FOR BLOOD PRESSURE SUPPORT)
PREFILLED_SYRINGE | INTRAVENOUS | Status: DC | PRN
  Administered 2023-02-23: 80 ug via INTRAVENOUS
  Administered 2023-02-23: 160 ug via INTRAVENOUS

## 2023-02-23 MED ORDER — MIDAZOLAM HCL 2 MG/2ML IJ SOLN
INTRAMUSCULAR | Status: AC
  Filled 2023-02-23: qty 2

## 2023-02-23 MED ORDER — SUGAMMADEX SODIUM 200 MG/2ML IV SOLN
INTRAVENOUS | Status: DC | PRN
  Administered 2023-02-23: 200 mg via INTRAVENOUS

## 2023-02-23 MED ORDER — TRAMADOL HCL 50 MG PO TABS
ORAL_TABLET | ORAL | Status: AC
  Filled 2023-02-23: qty 1

## 2023-02-23 MED ORDER — 0.9 % SODIUM CHLORIDE (POUR BTL) OPTIME
TOPICAL | Status: DC | PRN
  Administered 2023-02-23: 1000 mL

## 2023-02-23 MED ORDER — ENSURE PRE-SURGERY PO LIQD
296.0000 mL | Freq: Once | ORAL | Status: DC
  Filled 2023-02-23: qty 296

## 2023-02-23 MED ORDER — FENTANYL CITRATE (PF) 250 MCG/5ML IJ SOLN
INTRAMUSCULAR | Status: AC
  Filled 2023-02-23: qty 5

## 2023-02-23 MED ORDER — ONDANSETRON HCL 4 MG/2ML IJ SOLN
INTRAMUSCULAR | Status: DC | PRN
  Administered 2023-02-23: 4 mg via INTRAVENOUS

## 2023-02-23 MED ORDER — ACETAMINOPHEN 500 MG PO TABS: 1000.0000 mg | ORAL_TABLET | ORAL | Status: AC

## 2023-02-23 MED ORDER — LIDOCAINE 2% (20 MG/ML) 5 ML SYRINGE
INTRAMUSCULAR | Status: DC | PRN
  Administered 2023-02-23: 80 mg via INTRAVENOUS

## 2023-02-23 MED ORDER — FENTANYL CITRATE PF 50 MCG/ML IJ SOSY
PREFILLED_SYRINGE | INTRAMUSCULAR | Status: AC
  Administered 2023-02-23: 25 ug via INTRAVENOUS
  Filled 2023-02-23: qty 1

## 2023-02-23 MED ORDER — DEXAMETHASONE SODIUM PHOSPHATE 10 MG/ML IJ SOLN
INTRAMUSCULAR | Status: DC | PRN
  Administered 2023-02-23: 10 mg via INTRAVENOUS

## 2023-02-23 MED ORDER — BUPIVACAINE HCL (PF) 0.5 % IJ SOLN
INTRAMUSCULAR | Status: AC
  Filled 2023-02-23: qty 30

## 2023-02-23 MED ORDER — ROCURONIUM BROMIDE 10 MG/ML (PF) SYRINGE
PREFILLED_SYRINGE | INTRAVENOUS | Status: DC | PRN
  Administered 2023-02-23: 60 mg via INTRAVENOUS

## 2023-02-23 MED ORDER — BUPIVACAINE HCL (PF) 0.5 % IJ SOLN
INTRAMUSCULAR | Status: DC | PRN
  Administered 2023-02-23: 30 mL

## 2023-02-23 MED ORDER — FENTANYL CITRATE (PF) 100 MCG/2ML IJ SOLN
INTRAMUSCULAR | Status: DC | PRN
  Administered 2023-02-23: 100 ug via INTRAVENOUS
  Administered 2023-02-23: 50 ug via INTRAVENOUS

## 2023-02-23 MED ORDER — MIDAZOLAM HCL 5 MG/5ML IJ SOLN
INTRAMUSCULAR | Status: DC | PRN
  Administered 2023-02-23: 2 mg via INTRAVENOUS

## 2023-02-23 MED ORDER — TRAMADOL HCL 50 MG PO TABS: 50.0000 mg | ORAL_TABLET | Freq: Four times a day (QID) | ORAL | 0 refills | Status: DC | PRN

## 2023-02-23 MED ORDER — CHLORHEXIDINE GLUCONATE 0.12 % MT SOLN
15.0000 mL | Freq: Once | OROMUCOSAL | Status: AC
  Administered 2023-02-23: 15 mL via OROMUCOSAL

## 2023-02-23 MED ORDER — CEFAZOLIN SODIUM-DEXTROSE 2-4 GM/100ML-% IV SOLN
2.0000 g | INTRAVENOUS | Status: AC
  Administered 2023-02-23: 2 g via INTRAVENOUS
  Filled 2023-02-23: qty 100

## 2023-02-23 SURGICAL SUPPLY — 39 items
ADH SKN CLS APL DERMABOND .7 (GAUZE/BANDAGES/DRESSINGS) ×1
APL PRP STRL LF DISP 70% ISPRP (MISCELLANEOUS) ×1
BAG COUNTER SPONGE SURGICOUNT (BAG) IMPLANT
BAG SPNG CNTER NS LX DISP (BAG)
CHLORAPREP W/TINT 26 (MISCELLANEOUS) ×1 IMPLANT
COVER MAYO STAND STRL (DRAPES) ×1 IMPLANT
COVER SURGICAL LIGHT HANDLE (MISCELLANEOUS) ×1 IMPLANT
DERMABOND ADVANCED .7 DNX12 (GAUZE/BANDAGES/DRESSINGS) IMPLANT
DRAPE LAPAROSCOPIC ABDOMINAL (DRAPES) ×1 IMPLANT
DRAPE WARM FLUID 44X44 (DRAPES) ×1 IMPLANT
ELECT REM PT RETURN 15FT ADLT (MISCELLANEOUS) ×1 IMPLANT
GLOVE BIO SURGEON STRL SZ7.5 (GLOVE) ×1 IMPLANT
GLOVE BIOGEL PI IND STRL 7.0 (GLOVE) ×1 IMPLANT
GOWN STRL REUS W/ TWL XL LVL3 (GOWN DISPOSABLE) ×1 IMPLANT
GOWN STRL REUS W/TWL XL LVL3 (GOWN DISPOSABLE) ×1
HANDLE SUCTION POOLE (INSTRUMENTS) ×1 IMPLANT
KIT BASIN OR (CUSTOM PROCEDURE TRAY) ×1 IMPLANT
KIT TURNOVER KIT A (KITS) IMPLANT
LIGASURE IMPACT 36 18CM CVD LR (INSTRUMENTS) IMPLANT
NS IRRIG 1000ML POUR BTL (IV SOLUTION) ×1 IMPLANT
PACK GENERAL/GYN (CUSTOM PROCEDURE TRAY) ×1 IMPLANT
RELOAD PROXIMATE 75MM BLUE (ENDOMECHANICALS) IMPLANT
RELOAD STAPLE 75 3.8 BLU REG (ENDOMECHANICALS) IMPLANT
STAPLER GUN LINEAR PROX 60 (STAPLE) IMPLANT
STAPLER PROXIMATE 75MM BLUE (STAPLE) IMPLANT
STAPLER VISISTAT 35W (STAPLE) IMPLANT
SUCTION POOLE HANDLE (INSTRUMENTS) ×1
SUT MNCRL AB 4-0 PS2 18 (SUTURE) IMPLANT
SUT PDS AB 1 TP1 96 (SUTURE) IMPLANT
SUT SILK 2 0 (SUTURE) ×1
SUT SILK 2 0 SH CR/8 (SUTURE) ×1 IMPLANT
SUT SILK 2-0 18XBRD TIE 12 (SUTURE) ×1 IMPLANT
SUT SILK 3 0 SH CR/8 (SUTURE) ×1 IMPLANT
SUT VIC AB 2-0 SH 27 (SUTURE) ×2
SUT VIC AB 2-0 SH 27X BRD (SUTURE) IMPLANT
SUT VIC AB 3-0 SH 27 (SUTURE) ×1
SUT VIC AB 3-0 SH 27X BRD (SUTURE) IMPLANT
TOWEL OR 17X26 10 PK STRL BLUE (TOWEL DISPOSABLE) ×1 IMPLANT
TRAY FOLEY MTR SLVR 16FR STAT (SET/KITS/TRAYS/PACK) ×1 IMPLANT

## 2023-02-23 NOTE — Op Note (Signed)
OPEN LEFT LOWER QUADRANT EXPLORATION WITH EXCISION OF CHRONIC SCAR TISSUE  Procedure Note  Helen Young 02/23/2023   Pre-op Diagnosis: CHRONIC LEFT LOWER ABDOMINAL WALL PAIN     Post-op Diagnosis: same  Procedure(s): OPEN LEFT LOWER QUADRANT EXPLORATION WITH EXCISION OF CHRONIC SCAR TISSUE  Surgeon(s): [name], MD Carollee Herter, MD [facility] resident  Anesthesia: General  Staff:  Relief Scrub: Rema Jasmine Scrub Person: Paulla Fore, [facility]  Estimated Blood Loss: Minimal               Specimens: sent to path  Indications: This is a 62 year old female with multiple incisional hernia repairs from an Pfannenstiel incision in the past.  She had developed sharp left lower quadrant abdominal pain which appears in the abdominal wall with firmness in 1 specific area on physical examination.  CT scan was otherwise unremarkable regarding this area.  Because of the sharp discomfort decision was made to proceed to the operating room to explore this area  Findings: The patient was found to have dense scar tissue in the area of palpable concern as well as a small tear in the anterior aponeurosis with anterior fascia.  No other pathology was identified  Procedure:.  The patient was brought to the operating identified as correct patient.  She is placed upon the operating table general anesthesia was induced.  Her abdomen was then prepped and draped in usual sterile fashion.  We made a transverse incision in her left lower quadrant over the area of palpable concern which we had marked.  We took this down through subcutaneous tissue and anterior to the fascia identified a lot of hard scar tissue which was excised in 2 separate pieces with the cautery.  We then reached the fascia and there appeared to be a tear in the aponeurosis of the anterior fascia.  There were no palpable masses or any true hernias.  We closed the tear and the aponeurosis with interrupted 2-0 Vicryl sutures.   I then anesthetized the area with Marcaine.  Hemostasis was achieved with the cautery.  We irrigated the area with saline.  We then closed subcutaneous tissue with interrupted 3-0 Vicryl sutures and closed the skin with a running 4-0 Monocryl.  Dermabond was then applied.  The patient tolerated procedure well.  All the counts were correct at the end of the procedure.  The patient was then extubated in the operating room and taken in stable condition to recovery room.          [name]   Date: 02/23/2023  Time: 11:04 AM

## 2023-02-23 NOTE — Interval H&P Note (Signed)
History and Physical Interval Note: no change in H and P  02/23/2023 7:28 AM  Helen Young  has presented today for surgery, with the diagnosis of CHRONIC LEFT LOWER ABDOMINAL WALL PAIN.  The various methods of treatment have been discussed with the patient and family. After consideration of risks, benefits and other options for treatment, the patient has consented to  Procedure(s) with comments: OPEN EXPLORATION LEFT LOWER ABDOMINAL WALL (Left) - LMA as a surgical intervention.  The patient's history has been reviewed, patient examined, no change in status, stable for surgery.  I have reviewed the patient's chart and labs.  Questions were answered to the patient's satisfaction.     [name]

## 2023-02-23 NOTE — Transfer of Care (Signed)
Immediate Anesthesia Transfer of Care Note  Patient: Helen Young  Procedure(s) Performed: OPEN LEFT LOWER QUADRANT EXPLORATION WITH EXCISION OF CHRONIC SCAR TISSUE (Left)  Patient Location: PACU  Anesthesia Type:General  Level of Consciousness: awake and alert   Airway & Oxygen Therapy: Patient Spontanous Breathing and Patient connected to face mask oxygen  Post-op Assessment: Report given to RN and Post -op Vital signs reviewed and stable  Post vital signs: Reviewed and stable  Last Vitals:  Vitals Value Taken Time  BP    Temp    Pulse 81 02/23/23 1123  Resp    SpO2 100 % 02/23/23 1123  Vitals shown include unvalidated device data.  Last Pain:  Vitals:   02/23/23 0700  TempSrc: Oral  PainSc:          Complications: No notable events documented.

## 2023-02-23 NOTE — Discharge Instructions (Signed)
You may shower starting tomorrow  Ice pack, tylenol, and ibuprofen also for pain  No lifting more than 15 pounds for 2 weeks

## 2023-02-23 NOTE — Anesthesia Postprocedure Evaluation (Signed)
Anesthesia Post Note  Patient: Helen Young  Procedure(s) Performed: OPEN LEFT LOWER QUADRANT EXPLORATION WITH EXCISION OF CHRONIC SCAR TISSUE (Left)     Patient location during evaluation: PACU Anesthesia Type: General Level of consciousness: awake and alert Pain management: pain level controlled Vital Signs Assessment: post-procedure vital signs reviewed and stable Respiratory status: spontaneous breathing, nonlabored ventilation and respiratory function stable Cardiovascular status: blood pressure returned to baseline and stable Postop Assessment: no apparent nausea or vomiting Anesthetic complications: no  No notable events documented.  Last Vitals:  Vitals:   02/23/23 1200 02/23/23 1215  BP: (!) 125/92 (!) 130/93  Pulse: 86 79  Resp: 14 12  Temp:  36.6 C  SpO2: 93% 94%    Last Pain:  Vitals:   02/23/23 1215  TempSrc:   PainSc: 5                  Leitha Hyppolite,W. EDMOND

## 2023-02-23 NOTE — Anesthesia Procedure Notes (Signed)
Procedure Name: Intubation Date/Time: 02/23/2023 10:27 AM  Performed by: Kizzie Fantasia, CRNAPre-anesthesia Checklist: Patient identified, Emergency Drugs available, Suction available, Patient being monitored and Timeout performed Patient Re-evaluated:Patient Re-evaluated prior to induction Oxygen Delivery Method: Circle system utilized Preoxygenation: Pre-oxygenation with 100% oxygen Induction Type: IV induction Ventilation: Mask ventilation without difficulty Laryngoscope Size: Mac and 3 Grade View: Grade I Tube type: Oral Tube size: 7.0 mm Number of attempts: 1 Airway Equipment and Method: Stylet Placement Confirmation: ETT inserted through vocal cords under direct vision, positive ETCO2 and breath sounds checked- equal and bilateral Secured at: 21 cm Tube secured with: Tape Dental Injury: Teeth and Oropharynx as per pre-operative assessment

## 2023-02-24 ENCOUNTER — Encounter (HOSPITAL_COMMUNITY): Payer: Self-pay | Admitting: Surgery

## 2023-02-24 LAB — SURGICAL PATHOLOGY

## 2023-06-12 ENCOUNTER — Other Ambulatory Visit: Payer: Self-pay | Admitting: Internal Medicine

## 2023-06-13 ENCOUNTER — Other Ambulatory Visit: Payer: Self-pay | Admitting: Internal Medicine

## 2023-07-03 NOTE — Patient Instructions (Addendum)
Flu immunization administered today.     Blood work was ordered.   The lab is on the first floor.    Medications changes include :   none    Return in about 1 year (around 07/03/2024) for Physical Exam.    Health Maintenance, Female Adopting a healthy lifestyle and getting preventive care are important in promoting health and wellness. Ask your health care provider about: The right schedule for you to have regular tests and exams. Things you can do on your own to prevent diseases and keep yourself healthy. What should I know about diet, weight, and exercise? Eat a healthy diet  Eat a diet that includes plenty of vegetables, fruits, low-fat dairy products, and lean protein. Do not eat a lot of foods that are high in solid fats, added sugars, or sodium. Maintain a healthy weight Body mass index (BMI) is used to identify weight problems. It estimates body fat based on height and weight. Your health care provider can help determine your BMI and help you achieve or maintain a healthy weight. Get regular exercise Get regular exercise. This is one of the most important things you can do for your health. Most adults should: Exercise for at least 150 minutes each week. The exercise should increase your heart rate and make you sweat (moderate-intensity exercise). Do strengthening exercises at least twice a week. This is in addition to the moderate-intensity exercise. Spend less time sitting. Even light physical activity can be beneficial. Watch cholesterol and blood lipids Have your blood tested for lipids and cholesterol at 64 years of age, then have this test every 5 years. Have your cholesterol levels checked more often if: Your lipid or cholesterol levels are high. You are older than 64 years of age. You are at high risk for heart disease. What should I know about cancer screening? Depending on your health history and family history, you may need to have cancer screening at  various ages. This may include screening for: Breast cancer. Cervical cancer. Colorectal cancer. Skin cancer. Lung cancer. What should I know about heart disease, diabetes, and high blood pressure? Blood pressure and heart disease High blood pressure causes heart disease and increases the risk of stroke. This is more likely to develop in people who have high blood pressure readings or are overweight. Have your blood pressure checked: Every 3-5 years if you are 33-56 years of age. Every year if you are 56 years old or older. Diabetes Have regular diabetes screenings. This checks your fasting blood sugar level. Have the screening done: Once every three years after age 56 if you are at a normal weight and have a low risk for diabetes. More often and at a younger age if you are overweight or have a high risk for diabetes. What should I know about preventing infection? Hepatitis B If you have a higher risk for hepatitis B, you should be screened for this virus. Talk with your health care provider to find out if you are at risk for hepatitis B infection. Hepatitis C Testing is recommended for: Everyone born from [birth year] through 1967. Anyone with known risk factors for hepatitis C. Sexually transmitted infections (STIs) Get screened for STIs, including gonorrhea and chlamydia, if: You are sexually active and are younger than 64 years of age. You are older than 64 years of age and your health care provider tells you that you are at risk for this type of infection. Your sexual activity has changed since you were  last screened, and you are at increased risk for chlamydia or gonorrhea. Ask your health care provider if you are at risk. Ask your health care provider about whether you are at high risk for HIV. Your health care provider may recommend a prescription medicine to help prevent HIV infection. If you choose to take medicine to prevent HIV, you should first get tested for HIV. You should then be  tested every 3 months for as long as you are taking the medicine. Pregnancy If you are about to stop having your period (premenopausal) and you may become pregnant, seek counseling before you get pregnant. Take 400 to 800 micrograms (mcg) of folic acid every day if you become pregnant. Ask for birth control (contraception) if you want to prevent pregnancy. Osteoporosis and menopause Osteoporosis is a disease in which the bones lose minerals and strength with aging. This can result in bone fractures. If you are 2 years old or older, or if you are at risk for osteoporosis and fractures, ask your health care provider if you should: Be screened for bone loss. Take a calcium or vitamin D supplement to lower your risk of fractures. Be given hormone replacement therapy (HRT) to treat symptoms of menopause. Follow these instructions at home: Alcohol use Do not drink alcohol if: Your health care provider tells you not to drink. You are pregnant, may be pregnant, or are planning to become pregnant. If you drink alcohol: Limit how much you have to: 0-1 drink a day. Know how much alcohol is in your drink. In the [place] one drink equals one 12 oz bottle of beer (355 mL), one 5 oz glass of wine (148 mL), or one 1 oz glass of hard liquor (44 mL). Lifestyle Do not use any products that contain nicotine or tobacco. These products include cigarettes, chewing tobacco, and vaping devices, such as e-cigarettes. If you need help quitting, ask your health care provider. Do not use street drugs. Do not share needles. Ask your health care provider for help if you need support or information about quitting drugs. General instructions Schedule regular health, dental, and eye exams. Stay current with your vaccines. Tell your health care provider if: You often feel depressed. You have ever been abused or do not feel safe at home. Summary Adopting a healthy lifestyle and getting preventive care are important in  promoting health and wellness. Follow your health care provider's instructions about healthy diet, exercising, and getting tested or screened for diseases. Follow your health care provider's instructions on monitoring your cholesterol and blood pressure. This information is not intended to replace advice given to you by your health care provider. Make sure you discuss any questions you have with your health care provider. Document Revised: 01/12/2021 Document Reviewed: 01/12/2021 Elsevier Patient Education  2024 [employer]   Health Maintenance, Female Adopting a healthy lifestyle and getting preventive care are important in promoting health and wellness. Ask your health care provider about: The right schedule for you to have regular tests and exams. Things you can do on your own to prevent diseases and keep yourself healthy. What should I know about diet, weight, and exercise? Eat a healthy diet  Eat a diet that includes plenty of vegetables, fruits, low-fat dairy products, and lean protein. Do not eat a lot of foods that are high in solid fats, added sugars, or sodium. Maintain a healthy weight Body mass index (BMI) is used to identify weight problems. It estimates body fat based on height and weight.  Your health care provider can help determine your BMI and help you achieve or maintain a healthy weight. Get regular exercise Get regular exercise. This is one of the most important things you can do for your health. Most adults should: Exercise for at least 150 minutes each week. The exercise should increase your heart rate and make you sweat (moderate-intensity exercise). Do strengthening exercises at least twice a week. This is in addition to the moderate-intensity exercise. Spend less time sitting. Even light physical activity can be beneficial. Watch cholesterol and blood lipids Have your blood tested for lipids and cholesterol at 64 years of age, then have this test every 5  years. Have your cholesterol levels checked more often if: Your lipid or cholesterol levels are high. You are older than 64 years of age. You are at high risk for heart disease. What should I know about cancer screening? Depending on your health history and family history, you may need to have cancer screening at various ages. This may include screening for: Breast cancer. Cervical cancer. Colorectal cancer. Skin cancer. Lung cancer. What should I know about heart disease, diabetes, and high blood pressure? Blood pressure and heart disease High blood pressure causes heart disease and increases the risk of stroke. This is more likely to develop in people who have high blood pressure readings or are overweight. Have your blood pressure checked: Every 3-5 years if you are 60-13 years of age. Every year if you are 58 years old or older. Diabetes Have regular diabetes screenings. This checks your fasting blood sugar level. Have the screening done: Once every three years after age 15 if you are at a normal weight and have a low risk for diabetes. More often and at a younger age if you are overweight or have a high risk for diabetes. What should I know about preventing infection? Hepatitis B If you have a higher risk for hepatitis B, you should be screened for this virus. Talk with your health care provider to find out if you are at risk for hepatitis B infection. Hepatitis C Testing is recommended for: Everyone born from [birth year] through 1965. Anyone with known risk factors for hepatitis C. Sexually transmitted infections (STIs) Get screened for STIs, including gonorrhea and chlamydia, if: You are sexually active and are younger than 64 years of age. You are older than 64 years of age and your health care provider tells you that you are at risk for this type of infection. Your sexual activity has changed since you were last screened, and you are at increased risk for chlamydia or gonorrhea.  Ask your health care provider if you are at risk. Ask your health care provider about whether you are at high risk for HIV. Your health care provider may recommend a prescription medicine to help prevent HIV infection. If you choose to take medicine to prevent HIV, you should first get tested for HIV. You should then be tested every 3 months for as long as you are taking the medicine. Pregnancy If you are about to stop having your period (premenopausal) and you may become pregnant, seek counseling before you get pregnant. Take 400 to 800 micrograms (mcg) of folic acid every day if you become pregnant. Ask for birth control (contraception) if you want to prevent pregnancy. Osteoporosis and menopause Osteoporosis is a disease in which the bones lose minerals and strength with aging. This can result in bone fractures. If you are 33 years old or older, or if you are  at risk for osteoporosis and fractures, ask your health care provider if you should: Be screened for bone loss. Take a calcium or vitamin D supplement to lower your risk of fractures. Be given hormone replacement therapy (HRT) to treat symptoms of menopause. Follow these instructions at home: Alcohol use Do not drink alcohol if: Your health care provider tells you not to drink. You are pregnant, may be pregnant, or are planning to become pregnant. If you drink alcohol: Limit how much you have to: 0-1 drink a day. Know how much alcohol is in your drink. In the [place] one drink equals one 12 oz bottle of beer (355 mL), one 5 oz glass of wine (148 mL), or one 1 oz glass of hard liquor (44 mL). Lifestyle Do not use any products that contain nicotine or tobacco. These products include cigarettes, chewing tobacco, and vaping devices, such as e-cigarettes. If you need help quitting, ask your health care provider. Do not use street drugs. Do not share needles. Ask your health care provider for help if you need support or information about  quitting drugs. General instructions Schedule regular health, dental, and eye exams. Stay current with your vaccines. Tell your health care provider if: You often feel depressed. You have ever been abused or do not feel safe at home. Summary Adopting a healthy lifestyle and getting preventive care are important in promoting health and wellness. Follow your health care provider's instructions about healthy diet, exercising, and getting tested or screened for diseases. Follow your health care provider's instructions on monitoring your cholesterol and blood pressure. This information is not intended to replace advice given to you by your health care provider. Make sure you discuss any questions you have with your health care provider. Document Revised: 01/12/2021 Document Reviewed: 01/12/2021 Elsevier Patient Education  2024 [employer].

## 2023-07-03 NOTE — Progress Notes (Unsigned)
Subjective:    Patient ID: Helen Young, female    DOB: 1961/09/01, 62 y.o.   MRN: [id number]      HPI Helen Young is here for a Physical exam and her chronic medical problems.   BP elevated x few times - should come q 6 months   Medications and allergies reviewed with patient and updated if appropriate.  Current Outpatient Medications on File Prior to Visit  Medication Sig Dispense Refill   amLODipine (NORVASC) 5 MG tablet TAKE 1 TABLET(5 MG) BY MOUTH DAILY 30 tablet 0   traMADol (ULTRAM) 50 MG tablet Take 1-2 tablets (50-100 mg total) by mouth every 6 (six) hours as needed for moderate pain or severe pain. 40 tablet 0   valsartan-hydrochlorothiazide (DIOVAN-HCT) 320-25 MG tablet TAKE 1 TABLET BY MOUTH DAILY 30 tablet 0   No current facility-administered medications on file prior to visit.    Review of Systems     Objective:  There were no vitals filed for this visit. There were no vitals filed for this visit. There is no height or weight on file to calculate BMI.  BP Readings from Last 3 Encounters:  02/23/23 (!) 152/99  02/15/23 (!) 162/105  11/03/22 (!) 140/88    Wt Readings from Last 3 Encounters:  02/23/23 198 lb (89.8 kg)  02/15/23 198 lb (89.8 kg)  11/03/22 199 lb (90.3 kg)       Physical Exam Constitutional: She appears well-developed and well-nourished. No distress.  HENT:  Head: Normocephalic and atraumatic.  Right Ear: External ear normal. Normal ear canal and TM Left Ear: External ear normal.  Normal ear canal and TM Mouth/Throat: Oropharynx is clear and moist.  Eyes: Conjunctivae normal.  Neck: Neck supple. No tracheal deviation present. No thyromegaly present.  No carotid bruit  Cardiovascular: Normal rate, regular rhythm and normal heart sounds.   No murmur heard.  No edema. Pulmonary/Chest: Effort normal and breath sounds normal. No respiratory distress. She has no wheezes. She has no rales.  Breast: deferred   Abdominal: Soft. She exhibits  no distension. There is no tenderness.  Lymphadenopathy: She has no cervical adenopathy.  Skin: Skin is warm and dry. She is not diaphoretic.  Psychiatric: She has a normal mood and affect. Her behavior is normal.     Lab Results  Component Value Date   WBC 4.7 02/15/2023   HGB 11.9 (L) 02/15/2023   HCT 37.4 02/15/2023   PLT 305 02/15/2023   GLUCOSE 84 02/15/2023   CHOL 207 (H) 04/16/2022   TRIG 194.0 (H) 04/16/2022   HDL 50.10 04/16/2022   LDLDIRECT 118.0 03/06/2019   LDLCALC 118 (H) 04/16/2022   ALT 16 04/16/2022   AST 17 04/16/2022   NA 140 02/15/2023   K 3.7 02/15/2023   CL 106 02/15/2023   CREATININE 0.71 02/15/2023   BUN 14 02/15/2023   CO2 27 02/15/2023   TSH 1.020 10/16/2020   HGBA1C 5.0 04/16/2022         Assessment & Plan:   Physical exam: Screening blood work  ordered Exercise   Weight   Substance abuse  none   Reviewed recommended immunizations.   Health Maintenance  Topic Date Due   INFLUENZA VACCINE  04/07/2023   COVID-19 Vaccine (3 - 2023-24 season) 05/08/2023   MAMMOGRAM  09/02/2024   Colonoscopy  10/02/2024   DEXA SCAN  04/15/2025   DTaP/Tdap/Td (4 - Td or Tdap) 04/16/2032   Hepatitis C Screening  Completed   HIV Screening  Completed   Zoster Vaccines- Shingrix  Completed   HPV VACCINES  Aged Out          See Problem List for Assessment and Plan of chronic medical problems.

## 2023-07-04 ENCOUNTER — Ambulatory Visit (INDEPENDENT_AMBULATORY_CARE_PROVIDER_SITE_OTHER): Payer: BC Managed Care – PPO | Admitting: Internal Medicine

## 2023-07-04 ENCOUNTER — Encounter: Payer: Self-pay | Admitting: Internal Medicine

## 2023-07-04 VITALS — BP 130/80 | HR 82 | Temp 98.4°F | Wt 194.0 lb

## 2023-07-04 DIAGNOSIS — Z0000 Encounter for general adult medical examination without abnormal findings: Secondary | ICD-10-CM

## 2023-07-04 DIAGNOSIS — E669 Obesity, unspecified: Secondary | ICD-10-CM

## 2023-07-04 DIAGNOSIS — M8589 Other specified disorders of bone density and structure, multiple sites: Secondary | ICD-10-CM

## 2023-07-04 DIAGNOSIS — Z6831 Body mass index (BMI) 31.0-31.9, adult: Secondary | ICD-10-CM

## 2023-07-04 DIAGNOSIS — I10 Essential (primary) hypertension: Secondary | ICD-10-CM | POA: Diagnosis not present

## 2023-07-04 DIAGNOSIS — E559 Vitamin D deficiency, unspecified: Secondary | ICD-10-CM

## 2023-07-04 DIAGNOSIS — M419 Scoliosis, unspecified: Secondary | ICD-10-CM

## 2023-07-04 LAB — CBC WITH DIFFERENTIAL/PLATELET
Basophils Absolute: 0 10*3/uL (ref 0.0–0.1)
Basophils Relative: 0.6 % (ref 0.0–3.0)
Eosinophils Absolute: 0.1 10*3/uL (ref 0.0–0.7)
Eosinophils Relative: 1.3 % (ref 0.0–5.0)
HCT: 41.9 % (ref 36.0–46.0)
Hemoglobin: 13.3 g/dL (ref 12.0–15.0)
Lymphocytes Relative: 39.8 % (ref 12.0–46.0)
Lymphs Abs: 2.2 10*3/uL (ref 0.7–4.0)
MCHC: 31.7 g/dL (ref 30.0–36.0)
MCV: 90.6 fL (ref 78.0–100.0)
Monocytes Absolute: 0.4 10*3/uL (ref 0.1–1.0)
Monocytes Relative: 6.4 % (ref 3.0–12.0)
Neutro Abs: 2.8 10*3/uL (ref 1.4–7.7)
Neutrophils Relative %: 51.9 % (ref 43.0–77.0)
Platelets: 346 10*3/uL (ref 150.0–400.0)
RBC: 4.63 Mil/uL (ref 3.87–5.11)
RDW: 12.4 % (ref 11.5–15.5)
WBC: 5.5 10*3/uL (ref 4.0–10.5)

## 2023-07-04 LAB — COMPREHENSIVE METABOLIC PANEL
ALT: 13 U/L (ref 0–35)
AST: 16 U/L (ref 0–37)
Albumin: 5 g/dL (ref 3.5–5.2)
Alkaline Phosphatase: 53 U/L (ref 39–117)
BUN: 13 mg/dL (ref 6–23)
CO2: 30 meq/L (ref 19–32)
Calcium: 10 mg/dL (ref 8.4–10.5)
Chloride: 100 meq/L (ref 96–112)
Creatinine, Ser: 0.9 mg/dL (ref 0.40–1.20)
GFR: 67.83 mL/min (ref >60.00)
Glucose, Bld: 105 mg/dL — ABNORMAL HIGH (ref 70–99)
Potassium: 3.8 meq/L (ref 3.5–5.1)
Sodium: 139 meq/L (ref 135–145)
Total Bilirubin: 0.8 mg/dL (ref 0.2–1.2)
Total Protein: 7.9 g/dL (ref 6.0–8.3)

## 2023-07-04 LAB — LIPID PANEL
Cholesterol: 227 mg/dL — ABNORMAL HIGH (ref 0–200)
HDL: 62.6 mg/dL (ref >39.00)
LDL Cholesterol: 147 mg/dL — ABNORMAL HIGH (ref 0–99)
NonHDL: 164.83
Total CHOL/HDL Ratio: 4
Triglycerides: 91 mg/dL (ref 0.0–149.0)
VLDL: 18.2 mg/dL (ref 0.0–40.0)

## 2023-07-04 LAB — TSH: TSH: 0.7 u[IU]/mL (ref 0.35–5.50)

## 2023-07-04 NOTE — Assessment & Plan Note (Signed)
Chronic Advised regular exercise Advised decreased portions, low in carbs/sugars - high in protein/veges

## 2023-07-04 NOTE — Assessment & Plan Note (Signed)
Chronic BP well controlled Continue amlodipine 5 mg daily, valsartan -hct 320-25 mg daily Cmp, cbc, lipid, tsh

## 2023-07-04 NOTE — Assessment & Plan Note (Signed)
Chronic DEXA up-to-date Advised taking calcium and vitamin D daily Stressed regular exercise Check vitamin D level

## 2023-07-04 NOTE — Assessment & Plan Note (Signed)
Chronic Taking vitamin d daily Check vitamin d level  

## 2023-07-04 NOTE — Assessment & Plan Note (Signed)
Chronic Has chronic back pain Taking tylenol prn

## 2023-07-05 LAB — VITAMIN D 25 HYDROXY (VIT D DEFICIENCY, FRACTURES): VITD: 26 ng/mL — ABNORMAL LOW (ref 30.00–100.00)

## 2023-09-29 DIAGNOSIS — Z1231 Encounter for screening mammogram for malignant neoplasm of breast: Secondary | ICD-10-CM | POA: Diagnosis not present

## 2023-10-06 ENCOUNTER — Ambulatory Visit
Admission: RE | Admit: 2023-10-06 | Discharge: 2023-10-06 | Disposition: A | Discharge status: Home / Self Care | Payer: BC Managed Care – PPO | Source: Ambulatory Visit | Attending: Family Medicine | Admitting: Family Medicine

## 2023-10-06 ENCOUNTER — Other Ambulatory Visit: Payer: Self-pay

## 2023-10-06 VITALS — BP 177/106 | HR 103 | Temp 98.0°F | Resp 18

## 2023-10-06 DIAGNOSIS — J029 Acute pharyngitis, unspecified: Secondary | ICD-10-CM | POA: Insufficient documentation

## 2023-10-06 DIAGNOSIS — J069 Acute upper respiratory infection, unspecified: Secondary | ICD-10-CM | POA: Diagnosis not present

## 2023-10-06 LAB — POCT RAPID STREP A (OFFICE): Rapid Strep A Screen: NEGATIVE

## 2023-10-06 MED ORDER — BENZONATATE 200 MG PO CAPS
200.0000 mg | ORAL_CAPSULE | Freq: Three times a day (TID) | ORAL | 0 refills | Status: DC | PRN
Start: 2023-10-06 — End: 2023-11-01

## 2023-10-06 MED ORDER — ALBUTEROL SULFATE HFA 108 (90 BASE) MCG/ACT IN AERS
1.0000 | INHALATION_SPRAY | Freq: Four times a day (QID) | RESPIRATORY_TRACT | 0 refills | Status: DC | PRN
Start: 2023-10-06 — End: 2024-02-06

## 2023-10-06 NOTE — Discharge Instructions (Addendum)
The clinic will contact you with results of the strep throat culture done today if positive.  You may take Tessalon as needed for cough.  You may do salt water gargles and warm liquids such as teas and honey to help with your sore throat.  Over-the-counter ibuprofen and Tylenol as needed for fevers or bodyaches.  Lots of rest and fluids.  Please follow-up with your PCP in 2 days for recheck.  Please go to the ER if you develop any worsening symptoms.  I hope you feel better soon!

## 2023-10-06 NOTE — ED Triage Notes (Signed)
Patient C/O severe sore throat, cough, fever and chills. C/O body aches. Patient states she did a home covid and flu test that was negative. Patient states she feels short of breath with ambulation.

## 2023-10-06 NOTE — ED Provider Notes (Signed)
[facility]    CSN: [id number] Arrival date & time: 10/06/23  1236      History   Chief Complaint No chief complaint on file.   HPI Helen Young is a 63 y.o. female  presents for evaluation of URI symptoms for 2 days. Patient reports associated symptoms of sore throat, cough, congestion, body aches, fevers and chills and shortness of breath with ambulation. Denies N/V/D, ear pain. Patient does not have a hx of asthma. Patient is not an active smoker.   Reports sick contacts via granddaughter.  Pt has taken TheraFlu to OTC for symptoms.  Patient reports a negative flu and COVID test at home.  Pt has no other concerns at this time.   HPI  Past Medical History:  Diagnosis Date   Allergy    seasonal, environmental   Colon polyp 2021   Environmental allergies 02/27/2013   OTC meds daily   Headache(784.0) 02/27/2013   occ. migraines   HTN (hypertension)    Transfusion history    childhood s/p Tonsillectomy    Patient Active Problem List   Diagnosis Date Noted   LLQ abdominal pain 11/03/2022   Constipation 11/03/2022   Family history of diabetes mellitus 04/16/2022   Osteopenia 04/16/2022   Great toe pain, left 03/23/2022   Fracture, foot, left, closed, initial encounter 03/17/2022   Neck pain 12/06/2020   Left lower quadrant abdominal pain 03/21/2019   Diverticulosis 03/21/2019   Fatty liver 03/21/2019   Maxillary sinus polyp 11/06/2018   Lower back pain 08/23/2018   Right rotator cuff tear 05/19/2017   Vitamin D deficiency 12/01/2016   Sleep difficulties 08/11/2016   Numbness in left leg 07/16/2015   Back pain 12/06/2014   Obesity (BMI 30-39.9) 10/15/2013   Recurrent ventral incisional hernia 02/09/2013   Thoracic back pain 10/09/2012   HTN (hypertension) 04/06/2012   Scoliosis 04/06/2012   Environmental allergies 04/06/2012    Past Surgical History:  Procedure Laterality Date   ABDOMINAL HYSTERECTOMY     BLADDER REPAIR     BREAST REDUCTION  SURGERY  03/16/2016   COLONOSCOPY     CYSTOSTOMY N/A 03/02/2013   Procedure: CYSTOSTOMY CLOSURE ;  Surgeon: Sebastian Ache, MD;  Location: [facility];  Service: Urology;  Laterality: N/A;   INCISIONAL HERNIA REPAIR N/A 03/02/2013   Procedure: LAPAROSCOPIC INCISIONAL HERNIA CONVERTED TO OPEN ;  Surgeon: Shelly Rubenstein, MD;  Location: [facility];  Service: General;  Laterality: N/A;   INGUINAL HERNIA REPAIR     x's 2    KNEE SURGERY Left    torn meniscus - arthroscopic repair   LAPAROTOMY Left 02/23/2023   Procedure: OPEN LEFT LOWER QUADRANT EXPLORATION WITH EXCISION OF CHRONIC SCAR TISSUE;  Surgeon: [name], MD;  Location: [facility];  Service: General;  Laterality: Left;  LMA   NECK SURGERY     Disc replaced with a steel plate   TONSILLECTOMY     childhoood   TOTAL ABDOMINAL HYSTERECTOMY W/ BILATERAL SALPINGOOPHORECTOMY     prolapsed uterus at 37-67 years old, ovaries and tubes removed 10 years later due to cysts    OB History   No obstetric history on file.      Home Medications    Prior to Admission medications   Medication Sig Start Date End Date Taking? Authorizing Provider  albuterol (VENTOLIN HFA) 108 (90 Base) MCG/ACT inhaler Inhale 1-2 puffs into the lungs every 6 (six) hours as needed. 10/06/23  Yes Radford Pax, NP  benzonatate (  TESSALON) 200 MG capsule Take 1 capsule (200 mg total) by mouth 3 (three) times daily as needed. 10/06/23  Yes Radford Pax, NP  amLODipine (NORVASC) 5 MG tablet TAKE 1 TABLET(5 MG) BY MOUTH DAILY 06/13/23   Pincus Sanes, MD  cyanocobalamin (VITAMIN B12) 1000 MCG tablet Take 1,000 mcg by mouth 2 (two) times a week.    [provider]  pyridOXINE (B-6) 50 MG tablet Take 50 mg by mouth 2 (two) times a week.    [provider]  valsartan-hydrochlorothiazide (DIOVAN-HCT) 320-25 MG tablet TAKE 1 TABLET BY MOUTH DAILY 06/13/23   Pincus Sanes, MD    Family History Family History  Problem Relation Age of Onset   Heart disease  Mother    Stroke Mother    Hypertension Mother    Colon cancer Father    Prostate cancer Father    Heart disease Father    Hypertension Father    Colon polyps Father    Heart failure Father    Kidney disease Father    Sudden death Paternal Uncle        Cardiac issues   Heart failure Paternal Uncle    Kidney disease Maternal Grandfather    Diabetes Maternal Grandfather    Sudden death Paternal Grandfather    Heart failure Cousin    Esophageal cancer Neg Hx    Rectal cancer Neg Hx    Stomach cancer Neg Hx     Social History Social History   Tobacco Use   Smoking status: Former    Current packs/day: 0.00    Types: Cigarettes    Quit date: 09/06/2005    Years since quitting: 18.0   Smokeless tobacco: Never  Vaping Use   Vaping status: Never Used  Substance Use Topics   Alcohol use: Yes    Alcohol/week: 0.0 standard drinks of alcohol    Comment: Rarely   Drug use: No     Allergies   Fish allergy and Percocet [oxycodone-acetaminophen]   Review of Systems Review of Systems   Physical Exam Triage Vital Signs ED Triage Vitals  Encounter Vitals Group     BP 10/06/23 1325 (!) 177/106     Systolic BP Percentile --      Diastolic BP Percentile --      Pulse Rate 10/06/23 1325 (!) 103     Resp 10/06/23 1325 18     Temp 10/06/23 1325 98 F (36.7 C)     Temp Source 10/06/23 1325 Oral     SpO2 10/06/23 1325 98 %     Weight --      Height --      Head Circumference --      Peak Flow --      Pain Score 10/06/23 1327 8     Pain Loc --      Pain Education --      Exclude from Growth Chart --    No data found.  Updated Vital Signs BP (!) 177/106 (BP Location: Left Arm)   Pulse (!) 103   Temp 98 F (36.7 C) (Oral)   Resp 18   SpO2 98%   Visual Acuity Right Eye Distance:   Left Eye Distance:   Bilateral Distance:    Right Eye Near:   Left Eye Near:    Bilateral Near:     Physical Exam Vitals and nursing note reviewed.  Constitutional:      General:  She is not in acute distress.  Appearance: She is well-developed. She is not ill-appearing.  HENT:     Head: Normocephalic and atraumatic.     Right Ear: Tympanic membrane and ear canal normal.     Left Ear: Tympanic membrane and ear canal normal.     Nose: Congestion present.     Mouth/Throat:     Mouth: Mucous membranes are moist.     Pharynx: Oropharynx is clear. Uvula midline. Posterior oropharyngeal erythema present.     Tonsils: No tonsillar exudate or tonsillar abscesses.  Eyes:     Conjunctiva/sclera: Conjunctivae normal.     Pupils: Pupils are equal, round, and reactive to light.  Cardiovascular:     Rate and Rhythm: Normal rate and regular rhythm.     Heart sounds: Normal heart sounds.  Pulmonary:     Effort: Pulmonary effort is normal.     Breath sounds: Normal breath sounds.  Musculoskeletal:     Cervical back: Normal range of motion and neck supple.  Lymphadenopathy:     Cervical: No cervical adenopathy.  Skin:    General: Skin is warm and dry.  Neurological:     General: No focal deficit present.     Mental Status: She is alert and oriented to person, place, and time.  Psychiatric:        Mood and Affect: Mood normal.        Behavior: Behavior normal.      UC Treatments / Results  Labs (all labs ordered are listed, but only abnormal results are displayed) Labs Reviewed  CULTURE, GROUP A STREP [facility])  POCT RAPID STREP A (OFFICE)    EKG   Radiology No results found.  Procedures Procedures (including critical care time)  Medications Ordered in UC Medications - No data to display  Initial Impression / Assessment and Plan / UC Course  I have reviewed the triage vital signs and the nursing notes.  Pertinent labs & imaging results that were available during my care of the patient were reviewed by me and considered in my medical decision making (see chart for details).     Reviewed exam and symptoms with patient.  No red flags.  Negative rapid  strep, will send strep throat culture.  Discussed viral illness and symptomatic treatment.  Tessalon as needed for cough.  Albuterol inhaler as needed for shortness of breath.  Discussed rest and fluids.  PCP follow-up 2 days for recheck.  ER precautions reviewed and patient verbalized understanding. Final Clinical Impressions(s) / UC Diagnoses   Final diagnoses:  Viral upper respiratory illness  Sore throat     Discharge Instructions      The clinic will contact you with results of the strep throat culture done today if positive.  You may take Tessalon as needed for cough.  You may do salt water gargles and warm liquids such as teas and honey to help with your sore throat.  Over-the-counter ibuprofen and Tylenol as needed for fevers or bodyaches.  Lots of rest and fluids.  Please follow-up with your PCP in 2 days for recheck.  Please go to the ER if you develop any worsening symptoms.  I hope you feel better soon!    ED Prescriptions     Medication Sig Dispense Auth. Provider   benzonatate (TESSALON) 200 MG capsule Take 1 capsule (200 mg total) by mouth 3 (three) times daily as needed. 20 capsule Radford Pax, NP   albuterol (VENTOLIN HFA) 108 (90 Base) MCG/ACT inhaler Inhale 1-2 puffs into the  lungs every 6 (six) hours as needed. 1 each Radford Pax, NP      PDMP not reviewed this encounter.   Radford Pax, NP 10/06/23 1350

## 2023-10-09 LAB — CULTURE, GROUP A STREP (THRC)

## 2023-10-26 ENCOUNTER — Ambulatory Visit: Payer: BC Managed Care – PPO | Admitting: Internal Medicine

## 2023-10-31 NOTE — Progress Notes (Unsigned)
    Subjective:    Patient ID: Helen Young, female    DOB: 14-Apr-1961, 63 y.o.   MRN: [id number]      HPI Helen Young is here for No chief complaint on file.    Lower back pain-pain shoots from spine upwards-    Has thoracolumbar scoliosis  Medications and allergies reviewed with patient and updated if appropriate.  Current Outpatient Medications on File Prior to Visit  Medication Sig Dispense Refill   albuterol (VENTOLIN HFA) 108 (90 Base) MCG/ACT inhaler Inhale 1-2 puffs into the lungs every 6 (six) hours as needed. 1 each 0   amLODipine (NORVASC) 5 MG tablet TAKE 1 TABLET(5 MG) BY MOUTH DAILY 30 tablet 0   benzonatate (TESSALON) 200 MG capsule Take 1 capsule (200 mg total) by mouth 3 (three) times daily as needed. 20 capsule 0   cyanocobalamin (VITAMIN B12) 1000 MCG tablet Take 1,000 mcg by mouth 2 (two) times a week.     pyridOXINE (B-6) 50 MG tablet Take 50 mg by mouth 2 (two) times a week.     valsartan-hydrochlorothiazide (DIOVAN-HCT) 320-25 MG tablet TAKE 1 TABLET BY MOUTH DAILY 30 tablet 0   No current facility-administered medications on file prior to visit.    Review of Systems     Objective:  There were no vitals filed for this visit. BP Readings from Last 3 Encounters:  10/06/23 (!) 177/106  07/04/23 130/80  02/23/23 (!) 152/99   Wt Readings from Last 3 Encounters:  07/04/23 194 lb (88 kg)  02/23/23 198 lb (89.8 kg)  02/15/23 198 lb (89.8 kg)   There is no height or weight on file to calculate BMI.    Physical Exam         Assessment & Plan:    See Problem List for Assessment and Plan of chronic medical problems.

## 2023-11-01 ENCOUNTER — Encounter: Payer: Self-pay | Admitting: Internal Medicine

## 2023-11-01 ENCOUNTER — Ambulatory Visit (INDEPENDENT_AMBULATORY_CARE_PROVIDER_SITE_OTHER): Payer: BC Managed Care – PPO | Admitting: Internal Medicine

## 2023-11-01 VITALS — BP 132/78 | HR 79 | Temp 98.0°F | Ht 65.5 in | Wt 196.4 lb

## 2023-11-01 DIAGNOSIS — R198 Other specified symptoms and signs involving the digestive system and abdomen: Secondary | ICD-10-CM

## 2023-11-01 DIAGNOSIS — I10 Essential (primary) hypertension: Secondary | ICD-10-CM

## 2023-11-01 NOTE — Assessment & Plan Note (Signed)
 Chronic BP well controlled Continue amlodipine 5 mg daily, valsartan -hct 320-25 mg daily

## 2023-11-01 NOTE — Patient Instructions (Signed)
      Medications changes include :   stop mylanta - try pepcid ac or tums    A referral was ordered GI and someone will call you to schedule an appointment.

## 2023-11-01 NOTE — Assessment & Plan Note (Signed)
 New Started approximately 2 months ago and is intermittent Only occurs when she has loose stools and a lot of gas which is usually the result of taking Mylanta Sharp pain from the very low spine in the buttock region that shoots up to the mid spine and has some pressure that stays there for several seconds and then gradually resolves Does not happen with regular bowel movements No symptoms consistent with pelvic floor dysfunction ?  Pudendal nerve pain Stop using Olanta-advised trying to use Pepcid AC or Tums to see if that breaks the cycle of getting the loose stools and gas which is likely with irritating the nerve Will refer to GI for further evaluation/opinion Will discuss with her gynecologist At this point I do not think she needs any imaging of her lumbar spine

## 2023-11-17 DIAGNOSIS — Z01419 Encounter for gynecological examination (general) (routine) without abnormal findings: Secondary | ICD-10-CM | POA: Diagnosis not present

## 2023-12-19 ENCOUNTER — Ambulatory Visit: Payer: BC Managed Care – PPO | Admitting: Gastroenterology

## 2023-12-22 DIAGNOSIS — M810 Age-related osteoporosis without current pathological fracture: Secondary | ICD-10-CM | POA: Diagnosis not present

## 2024-02-05 NOTE — Progress Notes (Unsigned)
    Subjective:    Patient ID: Helen Young, female    DOB: [birth year], 63 y.o.   MRN: [id number]      HPI Tylisa is here for No chief complaint on file.    Cyst posterior neck -     Medications and allergies reviewed with patient and updated if appropriate.  Current Outpatient Medications on File Prior to Visit  Medication Sig Dispense Refill   albuterol  (VENTOLIN  HFA) 108 (90 Base) MCG/ACT inhaler Inhale 1-2 puffs into the lungs every 6 (six) hours as needed. 1 each 0   amLODipine  (NORVASC ) 5 MG tablet TAKE 1 TABLET(5 MG) BY MOUTH DAILY 30 tablet 0   cyanocobalamin (VITAMIN B12) 1000 MCG tablet Take 1,000 mcg by mouth 2 (two) times a week.     pyridOXINE (B-6) 50 MG tablet Take 50 mg by mouth 2 (two) times a week.     valsartan -hydrochlorothiazide  (DIOVAN -HCT) 320-25 MG tablet TAKE 1 TABLET BY MOUTH DAILY 30 tablet 0   No current facility-administered medications on file prior to visit.    Review of Systems     Objective:  There were no vitals filed for this visit. BP Readings from Last 3 Encounters:  11/01/23 132/78  10/06/23 (!) 177/106  07/04/23 130/80   Wt Readings from Last 3 Encounters:  11/01/23 196 lb 6.4 oz (89.1 kg)  07/04/23 194 lb (88 kg)  02/23/23 198 lb (89.8 kg)   There is no height or weight on file to calculate BMI.    Physical Exam         Assessment & Plan:    See Problem List for Assessment and Plan of chronic medical problems.

## 2024-02-05 NOTE — Patient Instructions (Incomplete)
   Medications changes include :   fluoxetine 20 mg daily,  xanax 0.5 mg twice daily as needed for panic attack.  Buspar 5 mg at night as needed for sleep.

## 2024-02-06 ENCOUNTER — Ambulatory Visit (INDEPENDENT_AMBULATORY_CARE_PROVIDER_SITE_OTHER): Admitting: Internal Medicine

## 2024-02-06 VITALS — BP 136/74 | HR 52 | Temp 98.5°F | Ht 65.5 in | Wt 199.0 lb

## 2024-02-06 DIAGNOSIS — R229 Localized swelling, mass and lump, unspecified: Secondary | ICD-10-CM | POA: Diagnosis not present

## 2024-02-06 DIAGNOSIS — I10 Essential (primary) hypertension: Secondary | ICD-10-CM | POA: Diagnosis not present

## 2024-02-06 NOTE — Assessment & Plan Note (Signed)
 Chronic BP controlled Continue amlodipine  5 mg daily, valsartan  -hct 320-25 mg daily

## 2024-02-06 NOTE — Assessment & Plan Note (Addendum)
 Acute Posterior left-sided neck Mobile, size of a dime approximately, no overlying erythema, minimally tender with palpation Feels benign Had a cyst there is 20 years ago that was removed surgically-likely recurrence For now she will monitor, but if this continues to grow she will let me know if she would like to have it removed-will refer to surgery

## 2024-02-10 ENCOUNTER — Telehealth: Admitting: Physician Assistant

## 2024-02-10 ENCOUNTER — Ambulatory Visit: Payer: Self-pay | Admitting: Urgent Care

## 2024-02-10 ENCOUNTER — Ambulatory Visit

## 2024-02-10 ENCOUNTER — Ambulatory Visit (HOSPITAL_BASED_OUTPATIENT_CLINIC_OR_DEPARTMENT_OTHER)
Admission: RE | Admit: 2024-02-10 | Discharge: 2024-02-10 | Disposition: A | Discharge status: Home / Self Care | Source: Ambulatory Visit | Attending: Urgent Care | Admitting: Urgent Care

## 2024-02-10 ENCOUNTER — Ambulatory Visit
Admission: RE | Admit: 2024-02-10 | Discharge: 2024-02-10 | Disposition: A | Discharge status: Home / Self Care | Source: Ambulatory Visit | Attending: Family Medicine | Admitting: Family Medicine

## 2024-02-10 VITALS — BP 150/101 | HR 108 | Temp 100.7°F | Resp 20

## 2024-02-10 DIAGNOSIS — J209 Acute bronchitis, unspecified: Secondary | ICD-10-CM | POA: Diagnosis not present

## 2024-02-10 DIAGNOSIS — J189 Pneumonia, unspecified organism: Secondary | ICD-10-CM | POA: Insufficient documentation

## 2024-02-10 DIAGNOSIS — R0602 Shortness of breath: Secondary | ICD-10-CM

## 2024-02-10 DIAGNOSIS — R042 Hemoptysis: Secondary | ICD-10-CM

## 2024-02-10 DIAGNOSIS — R059 Cough, unspecified: Secondary | ICD-10-CM | POA: Diagnosis not present

## 2024-02-10 LAB — POC COVID19/FLU A&B COMBO
Covid Antigen, POC: NEGATIVE
Influenza A Antigen, POC: NEGATIVE
Influenza B Antigen, POC: NEGATIVE

## 2024-02-10 MED ORDER — PROMETHAZINE-DM 6.25-15 MG/5ML PO SYRP: 5.0000 mL | ORAL_SOLUTION | Freq: Three times a day (TID) | ORAL | 0 refills | Status: DC | PRN

## 2024-02-10 MED ORDER — ALBUTEROL SULFATE HFA 108 (90 BASE) MCG/ACT IN AERS: 1.0000 | INHALATION_SPRAY | Freq: Four times a day (QID) | RESPIRATORY_TRACT | 0 refills | Status: AC | PRN

## 2024-02-10 MED ORDER — PREDNISONE 20 MG PO TABS: ORAL_TABLET | ORAL | 0 refills | Status: DC

## 2024-02-10 MED ORDER — AMOXICILLIN-POT CLAVULANATE 875-125 MG PO TABS: 1.0000 | ORAL_TABLET | Freq: Two times a day (BID) | ORAL | 0 refills | Status: DC

## 2024-02-10 MED ORDER — AZITHROMYCIN 250 MG PO TABS: ORAL_TABLET | ORAL | 0 refills | Status: DC

## 2024-02-10 NOTE — Discharge Instructions (Addendum)
 I have placed orders to have an x-ray done at the med center in [facility].  Please had there now.  Go through the main hospital and not the emergency room.  Once you are there and let them know that you will came to our clinic and we send she to their facility for an outpatient x-ray.  If no one is at the front desk then they are likely out the rest of the day and at that point you would have to go through the emergency room.  Do not check in as a patient through the emergency room.  Simply let them know that you are there for an outpatient x-ray from our clinic.  I will call you with your results and update our treatment plan if necessary after I get the report.    For now, I will have you start prednisone and albuterol  for your bronchitis. For sore throat or cough try using a honey-based tea. Use 3 teaspoons of honey with juice squeezed from half lemon. Place shaved pieces of ginger into 1/2-1 cup of water and warm over stove top. Then mix the ingredients and repeat every 4 hours as needed. Please take Tylenol  500mg -650mg  once every 6 hours for fevers, aches and pains. Hydrate very well with at least 2 liters (64 ounces) of water. Eat light meals such as soups (chicken and noodles, chicken wild rice, vegetable).  Do not eat any foods that you are allergic to.  Start an antihistamine like Zyrtec (10mg  daily) for postnasal drainage, sinus congestion.  You can take this together with prednisone and albuterol .

## 2024-02-10 NOTE — ED Provider Notes (Addendum)
 [facility]  Note:  This document was prepared using Conservation officer, historic buildings and may include unintentional dictation errors.  MRN: [id number] DOB: Mar 16, 1961  Subjective:   Helen Young is a 63 y.o. female presenting for 3 day history of productive coughing, hemoptysis, shob, wheezing, sinus congestion, fever. Denies chest pain, rashes, n/v, abdominal pain, changes to bowel or urinary habits, ear pain. No asthma. No smoking of any kind including cigarettes, cigars, vaping, marijuana use.    No current facility-administered medications for this encounter.  Current Outpatient Medications:    amLODipine  (NORVASC ) 5 MG tablet, TAKE 1 TABLET(5 MG) BY MOUTH DAILY, Disp: 30 tablet, Rfl: 0   cyanocobalamin (VITAMIN B12) 1000 MCG tablet, Take 1,000 mcg by mouth 2 (two) times a week., Disp: , Rfl:    pyridOXINE (B-6) 50 MG tablet, Take 50 mg by mouth 2 (two) times a week., Disp: , Rfl:    valsartan -hydrochlorothiazide  (DIOVAN -HCT) 320-25 MG tablet, TAKE 1 TABLET BY MOUTH DAILY, Disp: 30 tablet, Rfl: 0   Allergies  Allergen Reactions   Fish Allergy Itching and Swelling   Percocet [Oxycodone-Acetaminophen ] Swelling    Per patient- can take regular acetaminophen     Past Medical History:  Diagnosis Date   Allergy    seasonal, environmental   Colon polyp 2021   Environmental allergies 02/27/2013   OTC meds daily   Headache(784.0) 02/27/2013   occ. migraines   HTN (hypertension)    Transfusion history    childhood s/p Tonsillectomy     Past Surgical History:  Procedure Laterality Date   ABDOMINAL HYSTERECTOMY     BLADDER REPAIR     BREAST REDUCTION SURGERY  03/16/2016   COLONOSCOPY     CYSTOSTOMY N/A 03/02/2013   Procedure: CYSTOSTOMY CLOSURE ;  Surgeon: Osborn Blaze, MD;  Location: [facility];  Service: Urology;  Laterality: N/A;   INCISIONAL HERNIA REPAIR N/A 03/02/2013   Procedure: LAPAROSCOPIC INCISIONAL HERNIA CONVERTED TO OPEN ;  Surgeon: Rogena Class, MD;  Location: [facility];  Service: General;  Laterality: N/A;   INGUINAL HERNIA REPAIR     x's 2    KNEE SURGERY Left    torn meniscus - arthroscopic repair   LAPAROTOMY Left 02/23/2023   Procedure: OPEN LEFT LOWER QUADRANT EXPLORATION WITH EXCISION OF CHRONIC SCAR TISSUE;  Surgeon: Oza Blumenthal, MD;  Location: [facility];  Service: General;  Laterality: Left;  LMA   NECK SURGERY     Disc replaced with a steel plate   TONSILLECTOMY     childhoood   TOTAL ABDOMINAL HYSTERECTOMY W/ BILATERAL SALPINGOOPHORECTOMY     prolapsed uterus at 73-65 years old, ovaries and tubes removed 10 years later due to cysts    Family History  Problem Relation Age of Onset   Heart disease Mother    Stroke Mother    Hypertension Mother    Colon cancer Father    Prostate cancer Father    Heart disease Father    Hypertension Father    Colon polyps Father    Heart failure Father    Kidney disease Father    Sudden death Paternal Uncle        Cardiac issues   Heart failure Paternal Uncle    Kidney disease Maternal Grandfather    Diabetes Maternal Grandfather    Sudden death Paternal Grandfather    Heart failure Cousin    Esophageal cancer Neg Hx    Rectal cancer Neg Hx    Stomach cancer  Neg Hx     Social History   Tobacco Use   Smoking status: Former    Current packs/day: 0.00    Types: Cigarettes    Quit date: 09/06/2005    Years since quitting: 18.4   Smokeless tobacco: Never  Vaping Use   Vaping status: Never Used  Substance Use Topics   Alcohol use: Yes    Alcohol/week: 0.0 standard drinks of alcohol    Comment: Rarely   Drug use: No    ROS   Objective:   Vitals: BP (!) 150/101 (BP Location: Right Arm)   Pulse (!) 108   Temp (!) 100.7 F (38.2 C) (Oral)   Resp 20   SpO2 94%   Physical Exam Constitutional:      General: She is not in acute distress.    Appearance: Normal appearance. She is well-developed and normal weight. She is not ill-appearing,  toxic-appearing or diaphoretic.  HENT:     Head: Normocephalic and atraumatic.     Right Ear: Tympanic membrane, ear canal and external ear normal. No drainage or tenderness. No middle ear effusion. There is no impacted cerumen. Tympanic membrane is not erythematous or bulging.     Left Ear: Tympanic membrane, ear canal and external ear normal. No drainage or tenderness.  No middle ear effusion. There is no impacted cerumen. Tympanic membrane is not erythematous or bulging.     Nose: Nose normal. No congestion or rhinorrhea.     Mouth/Throat:     Mouth: Mucous membranes are moist. No oral lesions.     Pharynx: No pharyngeal swelling, oropharyngeal exudate, posterior oropharyngeal erythema or uvula swelling.     Tonsils: No tonsillar exudate or tonsillar abscesses.  Eyes:     General: No scleral icterus.       Right eye: No discharge.        Left eye: No discharge.     Extraocular Movements: Extraocular movements intact.     Right eye: Normal extraocular motion.     Left eye: Normal extraocular motion.     Conjunctiva/sclera: Conjunctivae normal.  Cardiovascular:     Rate and Rhythm: Normal rate and regular rhythm.     Heart sounds: Normal heart sounds. No murmur heard.    No friction rub. No gallop.  Pulmonary:     Effort: Pulmonary effort is normal. No respiratory distress.     Breath sounds: No stridor. Examination of the right-middle field reveals wheezing and rhonchi. Examination of the left-middle field reveals wheezing and rhonchi. Wheezing and rhonchi (R>L) present. No rales.  Chest:     Chest wall: No tenderness.  Musculoskeletal:     Cervical back: Normal range of motion and neck supple.  Lymphadenopathy:     Cervical: No cervical adenopathy.  Skin:    General: Skin is warm and dry.  Neurological:     General: No focal deficit present.     Mental Status: She is alert and oriented to person, place, and time.  Psychiatric:        Mood and Affect: Mood normal.         Behavior: Behavior normal.    Results for orders placed or performed during the hospital encounter of 02/10/24 (from the past 24 hours)  POC Covid19/Flu A&B Antigen     Status: Normal   Collection Time: 02/10/24 11:06 AM  Result Value Ref Range   Influenza A Antigen, POC Negative    Influenza B Antigen, POC Negative    Covid Antigen,  POC Negative     Assessment and Plan :   PDMP not reviewed this encounter.  1. Acute bronchitis, unspecified organism   2. Hemoptysis    Recommend starting prednisone, albuterol  and supportive care for acute bronchitis.  Will pursue an outpatient x-ray to rule out pneumonia.  Counseled patient on potential for adverse effects with medications prescribed/recommended today, ER and return-to-clinic precautions discussed, patient verbalized understanding.    Adolph Hoop, PA-C 02/10/24 1114   UPDATE: DG Chest 2 View Result Date: 02/10/2024 CLINICAL DATA:  Cough, pneumonia EXAM: CHEST - 2 VIEW COMPARISON:  March 10, 2013 FINDINGS: Ill-defined consolidative infiltrates right upper lobe against the fissure with subtle right lower lobe infiltrates could correlate with pneumonia Dextroscoliosis.  Heart normal size. IMPRESSION: Right lung pneumonia Electronically Signed   By: Fredrich Jefferson M.D.   On: 02/10/2024 11:44    1. Pneumonia of right lower lobe due to infectious organism   2. Hemoptysis   3. Acute bronchitis, unspecified organism    Will cover for pneumonia with Augmentin  and azithromycin.  Discussed this with the patient.  Repeat chest x-ray in 4 to 6 weeks.  Counseled patient on potential for adverse effects with medications prescribed/recommended today, ER and return-to-clinic precautions discussed, patient verbalized understanding.    Adolph Hoop, [place] 02/10/24 1201

## 2024-02-10 NOTE — Progress Notes (Signed)

## 2024-02-10 NOTE — ED Triage Notes (Addendum)
 Pt c/o prod cough, head/chest congestion, wheezing, SHOB sx started 6/3-NAD-steady gait

## 2024-02-13 ENCOUNTER — Encounter: Payer: Self-pay | Admitting: Internal Medicine

## 2024-02-15 ENCOUNTER — Ambulatory Visit: Admitting: Gastroenterology

## 2024-02-21 ENCOUNTER — Encounter: Payer: Self-pay | Admitting: Internal Medicine

## 2024-02-21 DIAGNOSIS — J189 Pneumonia, unspecified organism: Secondary | ICD-10-CM | POA: Insufficient documentation

## 2024-02-21 NOTE — Progress Notes (Signed)
 Subjective:    Patient ID: Helen Young, female    DOB: 1961/07/30, 63 y.o.   MRN: [id number]     HPI Helen Young is here for follow up from urgent care.  6/6-went to urgent care with 3 days of productive cough, hemoptysis, shortness of breath, wheezing, sinus congestion and fever.  She did not have any chest pain, nausea, abdominal pain, change in bowels, urine symptoms or rashes.  No history of asthma or smoking.  She had a temp of 100.7.  BP was elevated.  O2 sat 94%, pulse 108.  She had wheezing and rhonchi on auscultation.  X-ray showed right lung pneumonia-right upper lobe and right lower lobe.  She was prescribed prednisone , albuterol , Augmentin  and azithromycin .  Advised chest x-ray in 4-6 weeks to confirm resolution.  She does feel better overall.  She is still having residual cough that has improved, some mild shortness of breath that has improved, mild nasal congestion and fatigue that has also improved.   Medications and allergies reviewed with patient and updated if appropriate.  Current Outpatient Medications on File Prior to Visit  Medication Sig Dispense Refill   albuterol  (VENTOLIN  HFA) 108 (90 Base) MCG/ACT inhaler Inhale 1-2 puffs into the lungs every 6 (six) hours as needed for wheezing or shortness of breath. 18 g 0   amLODipine  (NORVASC ) 5 MG tablet TAKE 1 TABLET(5 MG) BY MOUTH DAILY 30 tablet 0   amoxicillin -clavulanate (AUGMENTIN ) 875-125 MG tablet Take 1 tablet by mouth 2 (two) times daily. 20 tablet 0   azithromycin  (ZITHROMAX ) 250 MG tablet Day 1: take 2 tablets. Day 2-5: Take 1 tablet daily. 6 tablet 0   cyanocobalamin (VITAMIN B12) 1000 MCG tablet Take 1,000 mcg by mouth 2 (two) times a week.     promethazine -dextromethorphan (PROMETHAZINE -DM) 6.25-15 MG/5ML syrup Take 5 mLs by mouth 3 (three) times daily as needed for cough. 200 mL 0   pyridOXINE (B-6) 50 MG tablet Take 50 mg by mouth 2 (two) times a week.     valsartan -hydrochlorothiazide  (DIOVAN -HCT) 320-25  MG tablet TAKE 1 TABLET BY MOUTH DAILY 30 tablet 0   predniSONE  (DELTASONE ) 20 MG tablet Take 2 tablets daily with breakfast. 10 tablet 0   No current facility-administered medications on file prior to visit.     Review of Systems  Constitutional:  Positive for fatigue (improved). Negative for appetite change, chills and fever.  HENT:  Positive for congestion (mild). Negative for ear pain, sinus pressure and sore throat.   Respiratory:  Positive for cough (residual - occ brings up a little sputum) and shortness of breath (mild with exertion -improved). Negative for chest tightness and wheezing.   Neurological:  Negative for dizziness, light-headedness and headaches.       Objective:   Vitals:   02/22/24 0932  BP: (!) 144/78  Pulse: 71  Temp: 98.4 F (36.9 C)  SpO2: 95%   BP Readings from Last 3 Encounters:  02/22/24 (!) 144/78  02/10/24 (!) 150/101  02/06/24 136/74   Wt Readings from Last 3 Encounters:  02/22/24 199 lb (90.3 kg)  02/06/24 199 lb (90.3 kg)  11/01/23 196 lb 6.4 oz (89.1 kg)   Body mass index is 32.61 kg/m.    Physical Exam Constitutional:      General: She is not in acute distress.    Appearance: Normal appearance.  HENT:     Head: Normocephalic and atraumatic.   Eyes:     Conjunctiva/sclera: Conjunctivae normal.  Cardiovascular:     Rate and Rhythm: Normal rate and regular rhythm.     Heart sounds: Normal heart sounds.  Pulmonary:     Effort: Pulmonary effort is normal. No respiratory distress.     Breath sounds: Normal breath sounds. No wheezing.   Musculoskeletal:     Cervical back: Neck supple.     Right lower leg: No edema.     Left lower leg: No edema.  Lymphadenopathy:     Cervical: No cervical adenopathy.   Skin:    General: Skin is warm and dry.     Findings: No rash.   Neurological:     Mental Status: She is alert. Mental status is at baseline.   Psychiatric:        Mood and Affect: Mood normal.        Behavior:  Behavior normal.        Lab Results  Component Value Date   WBC 5.5 07/04/2023   HGB 13.3 07/04/2023   HCT 41.9 07/04/2023   PLT 346.0 07/04/2023   GLUCOSE 105 (H) 07/04/2023   CHOL 227 (H) 07/04/2023   TRIG 91.0 07/04/2023   HDL 62.60 07/04/2023   LDLDIRECT 118.0 03/06/2019   LDLCALC 147 (H) 07/04/2023   ALT 13 07/04/2023   AST 16 07/04/2023   NA 139 07/04/2023   K 3.8 07/04/2023   CL 100 07/04/2023   CREATININE 0.90 07/04/2023   BUN 13 07/04/2023   CO2 30 07/04/2023   TSH 0.70 07/04/2023   HGBA1C 5.0 04/16/2022   DG Chest 2 View CLINICAL DATA:  Cough, pneumonia  EXAM: CHEST - 2 VIEW  COMPARISON:  March 10, 2013  FINDINGS: Ill-defined consolidative infiltrates right upper lobe against the fissure with subtle right lower lobe infiltrates could correlate with pneumonia  Dextroscoliosis.  Heart normal size.  IMPRESSION: Right lung pneumonia  Electronically Signed   By: Fredrich Jefferson M.D.   On: 02/10/2024 11:44    Assessment & Plan:    See Problem List for Assessment and Plan of chronic medical problems.

## 2024-02-21 NOTE — Patient Instructions (Addendum)
     A chest x-ray was ordered to confirm resolution of the pneumonia.  Have this done in the middle - end of July.   Monitor your BP at home -  goal < 130/80   Medications changes include :   None     Return in about 3 months (around 05/24/2024) for follow up.

## 2024-02-22 ENCOUNTER — Ambulatory Visit (INDEPENDENT_AMBULATORY_CARE_PROVIDER_SITE_OTHER): Admitting: Internal Medicine

## 2024-02-22 VITALS — BP 138/90 | HR 71 | Temp 98.4°F | Ht 65.5 in | Wt 199.0 lb

## 2024-02-22 DIAGNOSIS — I10 Essential (primary) hypertension: Secondary | ICD-10-CM

## 2024-02-22 DIAGNOSIS — J189 Pneumonia, unspecified organism: Secondary | ICD-10-CM

## 2024-02-22 NOTE — Assessment & Plan Note (Signed)
 Acute Has completed the medications and symptoms much improved, but still has some residual cough, shortness of breath, fatigue and nasal congestion Expect these to continue to slowly improve If your symptoms do not continue to improve or worsen she will let me know Repeat chest x-ray in about 6 weeks to confirm complete resolution

## 2024-02-22 NOTE — Assessment & Plan Note (Signed)
 Chronic Blood pressure not ideally controlled Stressed that her blood pressure needs to be controlled and possible consequences of uncontrolled hypertension Advised her to start monitoring at home regularly for the next 2 weeks and update me with her BP measures-discussed her blood pressure goal is less than 130/80 Advise low-sodium diet, regular exercise, weight loss For now continue amlodipine  5 mg daily, valsartan -HCTZ 320-25 mg daily

## 2024-03-26 ENCOUNTER — Ambulatory Visit (INDEPENDENT_AMBULATORY_CARE_PROVIDER_SITE_OTHER)

## 2024-03-26 DIAGNOSIS — J189 Pneumonia, unspecified organism: Secondary | ICD-10-CM | POA: Diagnosis not present

## 2024-03-29 ENCOUNTER — Ambulatory Visit: Payer: Self-pay | Admitting: Internal Medicine

## 2024-04-02 ENCOUNTER — Ambulatory Visit (INDEPENDENT_AMBULATORY_CARE_PROVIDER_SITE_OTHER): Admitting: Internal Medicine

## 2024-04-02 ENCOUNTER — Encounter: Payer: Self-pay | Admitting: Internal Medicine

## 2024-04-02 VITALS — BP 120/84 | HR 101 | Temp 98.6°F | Ht 65.5 in | Wt 201.0 lb

## 2024-04-02 DIAGNOSIS — Z111 Encounter for screening for respiratory tuberculosis: Secondary | ICD-10-CM | POA: Diagnosis not present

## 2024-04-02 DIAGNOSIS — I10 Essential (primary) hypertension: Secondary | ICD-10-CM

## 2024-04-02 MED ORDER — AMLODIPINE BESYLATE 5 MG PO TABS: ORAL_TABLET | ORAL | 1 refills | Status: AC

## 2024-04-02 MED ORDER — VALSARTAN-HYDROCHLOROTHIAZIDE 320-25 MG PO TABS: 1.0000 | ORAL_TABLET | Freq: Every day | ORAL | 1 refills | Status: AC

## 2024-04-02 NOTE — Patient Instructions (Addendum)
      Blood work was ordered.        Medications changes include :   None      Return for Schedule physical exam November.

## 2024-04-02 NOTE — Progress Notes (Signed)
 Subjective:    Patient ID: Helen Young, female    DOB: April 11, 1961, 63 y.o.   MRN: [id number]     HPI Helen Young is here for follow up of her chronic medical problems.  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  BP has been good at home - better than here today.  She is doing well with medications.  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  Medications and allergies reviewed with patient and updated if appropriate.  Current Outpatient Medications on File Prior to Visit  Medication Sig Dispense Refill   albuterol  (VENTOLIN  HFA) 108 (90 Base) MCG/ACT inhaler Inhale 1-2 puffs into the lungs every 6 (six) hours as needed for wheezing or shortness of breath. 18 g 0   cyanocobalamin (VITAMIN B12) 1000 MCG tablet Take 1,000 mcg by mouth 2 (two) times a week.     pyridOXINE (B-6) 50 MG tablet Take 50 mg by mouth 2 (two) times a week.     No current facility-administered medications on file prior to visit.     Review of Systems  Constitutional:  Negative for chills, diaphoresis, fever and unexpected weight change.  HENT:  Negative for hearing loss.   Eyes:  Negative for visual disturbance.  Respiratory:  Negative for cough, shortness of breath and wheezing.   Cardiovascular:  Negative for chest pain, palpitations and leg swelling.  Neurological:  Negative for light-headedness and headaches.       Objective:   Vitals:   04/02/24 1003 04/02/24 1028  BP: (!) 128/90 120/84  Pulse: (!) 101   Temp: 98.6 F (37 C)   SpO2: 97%    BP Readings from Last 3 Encounters:  04/02/24 120/84  02/22/24 (!) 138/90  02/10/24 (!) 150/101   Wt Readings from Last 3 Encounters:  04/02/24 201 lb (91.2 kg)  02/22/24 199 lb (90.3 kg)  02/06/24 199 lb (90.3 kg)   Body mass index is 32.94 kg/m.    Physical Exam Constitutional:      General: She is not in acute distress.    Appearance: Normal appearance.  HENT:     Head: Normocephalic and  atraumatic.  Eyes:     Conjunctiva/sclera: Conjunctivae normal.  Cardiovascular:     Rate and Rhythm: Normal rate and regular rhythm.     Heart sounds: Normal heart sounds.  Pulmonary:     Effort: Pulmonary effort is normal. No respiratory distress.     Breath sounds: Normal breath sounds. No wheezing.  Musculoskeletal:     Cervical back: Neck supple.     Right lower leg: No edema.     Left lower leg: No edema.  Lymphadenopathy:     Cervical: No cervical adenopathy.  Skin:    General: Skin is warm and dry.     Findings: No rash.  Neurological:     Mental Status: She is alert. Mental status is at baseline.  Psychiatric:        Mood and Affect: Mood normal.        Behavior: Behavior normal.        Lab Results  Component Value Date   WBC 5.5 07/04/2023   HGB 13.3 07/04/2023   HCT 41.9 07/04/2023   PLT 346.0 07/04/2023   GLUCOSE 105 (H) 07/04/2023   CHOL 227 (H) 07/04/2023   TRIG 91.0 07/04/2023   HDL 62.60 07/04/2023   LDLDIRECT 118.0 03/06/2019   LDLCALC 147 (  H) 07/04/2023   ALT 13 07/04/2023   AST 16 07/04/2023   NA 139 07/04/2023   K 3.8 07/04/2023   CL 100 07/04/2023   CREATININE 0.90 07/04/2023   BUN 13 07/04/2023   CO2 30 07/04/2023   TSH 0.70 07/04/2023   HGBA1C 5.0 04/16/2022     Assessment & Plan:   PPD placed today  See Problem List for Assessment and Plan of chronic medical problems.

## 2024-04-02 NOTE — Assessment & Plan Note (Signed)
 Chronic Blood pressure improved and controlled on recheck Blood pressure controlled at home Continue to monitor BP at home Continue amlodipine  5 mg daily, valsartan -HCTZ 320-25 mg daily

## 2024-04-04 ENCOUNTER — Ambulatory Visit

## 2024-04-04 LAB — TB SKIN TEST
Induration: 0 mm
TB Skin Test: NEGATIVE

## 2024-04-04 NOTE — Progress Notes (Signed)
 Patient seen in office today for follow up regarding PPD reading.  Results are negative and have been documented in the chart
